# Patient Record
Sex: Female | Born: 1990 | Race: White | Hispanic: No | Smoking: Current every day smoker
Health system: Southern US, Community
[De-identification: ages and names within clinical notes are randomized; demographics above are authoritative.]

## PROBLEM LIST (undated history)

## (undated) DIAGNOSIS — F909 Attention-deficit hyperactivity disorder, unspecified type: Secondary | ICD-10-CM

## (undated) DIAGNOSIS — F329 Major depressive disorder, single episode, unspecified: Secondary | ICD-10-CM

## (undated) DIAGNOSIS — R140 Abdominal distension (gaseous): Secondary | ICD-10-CM

## (undated) DIAGNOSIS — R350 Frequency of micturition: Secondary | ICD-10-CM

## (undated) DIAGNOSIS — R109 Unspecified abdominal pain: Secondary | ICD-10-CM

## (undated) DIAGNOSIS — R7303 Prediabetes: Secondary | ICD-10-CM

## (undated) DIAGNOSIS — F419 Anxiety disorder, unspecified: Secondary | ICD-10-CM

## (undated) DIAGNOSIS — I10 Essential (primary) hypertension: Secondary | ICD-10-CM

## (undated) DIAGNOSIS — B009 Herpesviral infection, unspecified: Secondary | ICD-10-CM

## (undated) DIAGNOSIS — F250 Schizoaffective disorder, bipolar type: Secondary | ICD-10-CM

## (undated) DIAGNOSIS — E119 Type 2 diabetes mellitus without complications: Secondary | ICD-10-CM

## (undated) DIAGNOSIS — M549 Dorsalgia, unspecified: Secondary | ICD-10-CM

## (undated) DIAGNOSIS — K219 Gastro-esophageal reflux disease without esophagitis: Secondary | ICD-10-CM

## (undated) DIAGNOSIS — R319 Hematuria, unspecified: Secondary | ICD-10-CM

## (undated) DIAGNOSIS — F259 Schizoaffective disorder, unspecified: Secondary | ICD-10-CM

## (undated) DIAGNOSIS — M1990 Unspecified osteoarthritis, unspecified site: Secondary | ICD-10-CM

## (undated) DIAGNOSIS — F32A Depression, unspecified: Secondary | ICD-10-CM

## (undated) HISTORY — DX: Dorsalgia, unspecified: M54.9

## (undated) HISTORY — DX: Type 2 diabetes mellitus without complications: E11.9

## (undated) HISTORY — DX: Frequency of micturition: R35.0

## (undated) HISTORY — DX: Essential (primary) hypertension: I10

## (undated) HISTORY — DX: Attention-deficit hyperactivity disorder, unspecified type: F90.9

## (undated) HISTORY — PX: DILATION AND CURETTAGE OF UTERUS: SHX78

## (undated) HISTORY — DX: Prediabetes: R73.03

## (undated) HISTORY — DX: Gastro-esophageal reflux disease without esophagitis: K21.9

## (undated) HISTORY — DX: Hematuria, unspecified: R31.9

## (undated) HISTORY — DX: Herpesviral infection, unspecified: B00.9

## (undated) HISTORY — DX: Abdominal distension (gaseous): R14.0

## (undated) HISTORY — PX: ESSURE TUBAL LIGATION: SUR464

## (undated) HISTORY — DX: Unspecified abdominal pain: R10.9

---

## 2014-08-13 ENCOUNTER — Emergency Department (HOSPITAL_COMMUNITY)
Admission: EM | Admit: 2014-08-13 | Discharge: 2014-08-13 | Disposition: A | Discharge status: Home / Self Care | Payer: Medicaid - Out of State | Attending: Emergency Medicine | Admitting: Emergency Medicine

## 2014-08-13 ENCOUNTER — Encounter (HOSPITAL_COMMUNITY): Payer: Self-pay | Admitting: Emergency Medicine

## 2014-08-13 DIAGNOSIS — Z3202 Encounter for pregnancy test, result negative: Secondary | ICD-10-CM | POA: Diagnosis not present

## 2014-08-13 DIAGNOSIS — Z8659 Personal history of other mental and behavioral disorders: Secondary | ICD-10-CM | POA: Insufficient documentation

## 2014-08-13 DIAGNOSIS — G43809 Other migraine, not intractable, without status migrainosus: Secondary | ICD-10-CM | POA: Diagnosis not present

## 2014-08-13 DIAGNOSIS — Z720 Tobacco use: Secondary | ICD-10-CM | POA: Insufficient documentation

## 2014-08-13 DIAGNOSIS — R51 Headache: Secondary | ICD-10-CM | POA: Diagnosis present

## 2014-08-13 HISTORY — DX: Schizoaffective disorder, unspecified: F25.9

## 2014-08-13 HISTORY — DX: Schizoaffective disorder, bipolar type: F25.0

## 2014-08-13 HISTORY — DX: Anxiety disorder, unspecified: F41.9

## 2014-08-13 HISTORY — DX: Major depressive disorder, single episode, unspecified: F32.9

## 2014-08-13 HISTORY — DX: Depression, unspecified: F32.A

## 2014-08-13 LAB — URINALYSIS, ROUTINE W REFLEX MICROSCOPIC
Bilirubin Urine: NEGATIVE
Glucose, UA: NEGATIVE mg/dL
Ketones, ur: NEGATIVE mg/dL
Nitrite: NEGATIVE
PROTEIN: NEGATIVE mg/dL
Specific Gravity, Urine: 1.01 (ref 1.005–1.030)
Urobilinogen, UA: 0.2 mg/dL (ref 0.0–1.0)
pH: 6 (ref 5.0–8.0)

## 2014-08-13 LAB — URINE MICROSCOPIC-ADD ON

## 2014-08-13 LAB — POC URINE PREG, ED: Preg Test, Ur: NEGATIVE

## 2014-08-13 LAB — CBG MONITORING, ED: Glucose-Capillary: 105 mg/dL — ABNORMAL HIGH (ref 70–99)

## 2014-08-13 NOTE — ED Notes (Addendum)
PT states she thinks she is having periods of hypoglycemia x3 days and a headache x3 days. PT [facility] and dx with vertigo and was triaged and [facility]. Pt also reports increased urinary frequency and fatigue.

## 2014-08-13 NOTE — ED Provider Notes (Signed)
CSN: [id number]     Arrival date & time 08/13/14  1542 History   First MD Initiated Contact with Patient 08/13/14 1800     Chief Complaint  Patient presents with  . Headache      HPI Patient reports intermittent episodes of feeling off-balance followed shortly by a headache.  She denies weakness of her arms or legs.  No prior history of stroke.  She has a history of neuropathy, anxiety, depression.  She denies chest pain or shortness of breath.  No difficulty walking.  She states the symptoms seem to come and go.  It usually begins with a feeling of off balance followed shortly by headache.  She does have a history of migraine headaches as well.  She smokes cigarettes but has no family history of early stroke.  Currently she is asymptomatic in the emergency department.   Past Medical History  Diagnosis Date  . Neuropathy     idopathic  . Anxiety   . Depression   . Schizo-affective schizophrenia    Past Surgical History  Procedure Laterality Date  . Dilation and curettage of uterus     No family history on file. History  Substance Use Topics  . Smoking status: Current Every Day Smoker -- 1.00 packs/day    Types: Cigarettes  . Smokeless tobacco: Not on file  . Alcohol Use: Yes     Comment: occasionally    OB History    Gravida Para Term Preterm AB TAB SAB Ectopic Multiple Living            1     Review of Systems  All other systems reviewed and are negative.     Allergies  Review of patient's allergies indicates no known allergies.  Home Medications   Prior to Admission medications   Not on File   BP 140/75 mmHg  Pulse 96  Temp(Src) 98 F (36.7 C) (Oral)  Resp 16  Ht 5\' 7"  (1.702 m)  Wt 270 lb (122.471 kg)  BMI 42.28 kg/m2  SpO2 97%  LMP 08/05/2014 Physical Exam  Constitutional: She is oriented to person, place, and time. She appears well-developed and well-nourished. No distress.  HENT:  Head: Normocephalic and atraumatic.  Eyes: EOM are normal.  Pupils are equal, round, and reactive to light.  Neck: Normal range of motion.  Cardiovascular: Normal rate, regular rhythm and normal heart sounds.   Pulmonary/Chest: Effort normal and breath sounds normal.  Abdominal: Soft. She exhibits no distension. There is no tenderness.  Musculoskeletal: Normal range of motion.  Neurological: She is alert and oriented to person, place, and time.  5/5 strength in major muscle groups of  bilateral upper and lower extremities. Speech normal. No facial asymetry.   Skin: Skin is warm and dry.  Psychiatric: She has a normal mood and affect. Judgment normal.  Nursing note and vitals reviewed.   ED Course  Procedures (including critical care time) Labs Review Labs Reviewed  CBG MONITORING, ED - Abnormal; Notable for the following:    Glucose-Capillary 105 (*)    All other components within normal limits  CBG MONITORING, ED  POC URINE PREG, ED    Imaging Review No results found.   EKG Interpretation None      MDM   Final diagnoses:  Migraine variant    Suspect vertigo versus basilar migraine.  Likely migraine variant.  Patient has a neurologist I'm asked that she call her neurologist on Monday for follow-up.  Normal neurologic exam at this  time.  No indication for CT imaging of her head.  If this persists she may need an MRI as an outpatient.  This does not need to be done on emergent basis today.  Doubt stroke.    Lyanne CoKevin M Jeanae Whitmill, MD 08/13/14 [phone]

## 2014-08-31 ENCOUNTER — Emergency Department (HOSPITAL_COMMUNITY)
Admission: EM | Admit: 2014-08-31 | Discharge: 2014-09-01 | Disposition: A | Discharge status: Home / Self Care | Payer: Medicaid - Out of State | Attending: Emergency Medicine | Admitting: Emergency Medicine

## 2014-08-31 ENCOUNTER — Encounter (HOSPITAL_COMMUNITY): Payer: Self-pay | Admitting: Emergency Medicine

## 2014-08-31 DIAGNOSIS — Z720 Tobacco use: Secondary | ICD-10-CM | POA: Insufficient documentation

## 2014-08-31 DIAGNOSIS — Z3202 Encounter for pregnancy test, result negative: Secondary | ICD-10-CM | POA: Diagnosis not present

## 2014-08-31 DIAGNOSIS — G629 Polyneuropathy, unspecified: Secondary | ICD-10-CM | POA: Insufficient documentation

## 2014-08-31 DIAGNOSIS — R51 Headache: Secondary | ICD-10-CM | POA: Diagnosis not present

## 2014-08-31 DIAGNOSIS — R42 Dizziness and giddiness: Secondary | ICD-10-CM | POA: Diagnosis not present

## 2014-08-31 DIAGNOSIS — Z8659 Personal history of other mental and behavioral disorders: Secondary | ICD-10-CM | POA: Insufficient documentation

## 2014-08-31 DIAGNOSIS — B349 Viral infection, unspecified: Secondary | ICD-10-CM | POA: Diagnosis not present

## 2014-08-31 LAB — CBC WITH DIFFERENTIAL/PLATELET
BASOS PCT: 0 % (ref 0–1)
Basophils Absolute: 0 10*3/uL (ref 0.0–0.1)
Eosinophils Absolute: 0.1 10*3/uL (ref 0.0–0.7)
Eosinophils Relative: 1 % (ref 0–5)
HEMATOCRIT: 41.7 % (ref 36.0–46.0)
HEMOGLOBIN: 14.1 g/dL (ref 12.0–15.0)
LYMPHS PCT: 16 % (ref 12–46)
Lymphs Abs: 2.4 10*3/uL (ref 0.7–4.0)
MCH: 30.5 pg (ref 26.0–34.0)
MCHC: 33.8 g/dL (ref 30.0–36.0)
MCV: 90.3 fL (ref 78.0–100.0)
MONO ABS: 0.5 10*3/uL (ref 0.1–1.0)
MONOS PCT: 3 % (ref 3–12)
NEUTROS ABS: 12.4 10*3/uL — AB (ref 1.7–7.7)
Neutrophils Relative %: 81 % — ABNORMAL HIGH (ref 43–77)
Platelets: 238 10*3/uL (ref 150–400)
RBC: 4.62 MIL/uL (ref 3.87–5.11)
RDW: 12.9 % (ref 11.5–15.5)
WBC: 15.4 10*3/uL — ABNORMAL HIGH (ref 4.0–10.5)

## 2014-08-31 LAB — BASIC METABOLIC PANEL
Anion gap: 4 — ABNORMAL LOW (ref 5–15)
BUN: 8 mg/dL (ref 6–23)
CO2: 29 mmol/L (ref 19–32)
CREATININE: 0.73 mg/dL (ref 0.50–1.10)
Calcium: 8.8 mg/dL (ref 8.4–10.5)
Chloride: 107 mEq/L (ref 96–112)
GFR calc Af Amer: >90 mL/min (ref >90)
GFR calc non Af Amer: >90 mL/min (ref >90)
Glucose, Bld: 114 mg/dL — ABNORMAL HIGH (ref 70–99)
Potassium: 3.6 mmol/L (ref 3.5–5.1)
Sodium: 140 mmol/L (ref 135–145)

## 2014-08-31 LAB — URINALYSIS, ROUTINE W REFLEX MICROSCOPIC
Bilirubin Urine: NEGATIVE
GLUCOSE, UA: NEGATIVE mg/dL
Ketones, ur: NEGATIVE mg/dL
Leukocytes, UA: NEGATIVE
Nitrite: NEGATIVE
Protein, ur: NEGATIVE mg/dL
Specific Gravity, Urine: 1.025 (ref 1.005–1.030)
Urobilinogen, UA: 0.2 mg/dL (ref 0.0–1.0)
pH: 6 (ref 5.0–8.0)

## 2014-08-31 LAB — POC URINE PREG, ED: PREG TEST UR: NEGATIVE

## 2014-08-31 LAB — URINE MICROSCOPIC-ADD ON

## 2014-08-31 NOTE — ED Notes (Signed)
Patient reports dizziness, generalized weakness, nausea, and fatigue x 2 weeks. States recently evaluated here for same.

## 2014-08-31 NOTE — ED Notes (Signed)
POC Urine pregnancy negative. 

## 2014-08-31 NOTE — ED Provider Notes (Signed)
CSN: [id number]     Arrival date & time 08/31/14  2128 History   First MD Initiated Contact with Patient 08/31/14 2213     Chief Complaint  Patient presents with  . Dizziness     (Consider location/radiation/quality/duration/timing/severity/associated sxs/prior Treatment) Patient is a 23 y.o. female presenting with dizziness. The history is provided by the patient.  Dizziness Quality:  Lightheadedness Duration:  2 weeks Associated symptoms: headaches   Associated symptoms: no blood in stool, no chest pain, no palpitations and no shortness of breath     Past Medical History  Diagnosis Date  . Neuropathy     idopathic  . Anxiety   . Depression   . Schizo-affective schizophrenia    Past Surgical History  Procedure Laterality Date  . Dilation and curettage of uterus     History reviewed. No pertinent family history. History  Substance Use Topics  . Smoking status: Current Every Day Smoker -- 1.00 packs/day    Types: Cigarettes  . Smokeless tobacco: Not on file  . Alcohol Use: Yes     Comment: occasionally    OB History    Gravida Para Term Preterm AB TAB SAB Ectopic Multiple Living            1     Review of Systems  Constitutional: Negative for activity change.       All ROS Neg except as noted in HPI  HENT: Negative for nosebleeds.   Eyes: Negative for photophobia and discharge.  Respiratory: Negative for cough, shortness of breath and wheezing.   Cardiovascular: Negative for chest pain and palpitations.  Gastrointestinal: Negative for abdominal pain and blood in stool.  Genitourinary: Negative for dysuria, frequency and hematuria.  Musculoskeletal: Negative for back pain, arthralgias and neck pain.  Skin: Negative.   Neurological: Positive for dizziness and headaches. Negative for seizures and speech difficulty.  Psychiatric/Behavioral: Negative for hallucinations and confusion. The patient is nervous/anxious.        Depression      Allergies  Review of  patient's allergies indicates no known allergies.  Home Medications   Prior to Admission medications   Not on File   BP 129/75 mmHg  Pulse 75  Temp(Src) 98 F (36.7 C) (Oral)  Resp 20  Ht 5\' 8"  (1.727 m)  Wt 270 lb (122.471 kg)  BMI 41.06 kg/m2  SpO2 100%  LMP 08/05/2014 Physical Exam  Constitutional: She is oriented to person, place, and time. She appears well-developed and well-nourished.  Non-toxic appearance.  HENT:  Head: Normocephalic.  Right Ear: Tympanic membrane and external ear normal.  Left Ear: Tympanic membrane and external ear normal.  Eyes: EOM and lids are normal. Pupils are equal, round, and reactive to light.  No nystagmus.  Neck: Normal range of motion. Neck supple. Carotid bruit is not present.  Cardiovascular: Normal rate, regular rhythm, normal heart sounds, intact distal pulses and normal pulses.   Pulmonary/Chest: Breath sounds normal. No respiratory distress. She has no wheezes. She has no rales.  Abdominal: Soft. Bowel sounds are normal. There is no tenderness. There is no guarding.  Musculoskeletal: Normal range of motion. She exhibits no edema or tenderness.  Lymphadenopathy:       Head (right side): No submandibular adenopathy present.       Head (left side): No submandibular adenopathy present.    She has no cervical adenopathy.  Neurological: She is alert and oriented to person, place, and time. She has normal strength. No cranial nerve deficit or  sensory deficit. She exhibits normal muscle tone. Coordination normal.  Gait steady.  Skin: Skin is warm and dry.  Psychiatric: She has a normal mood and affect. Her speech is normal.  Nursing note and vitals reviewed.   ED Course  Pt seen with me by Dr Bebe ShaggyWickline.  Procedures (including critical care time) Labs Review Labs Reviewed - No data to display  Imaging Review No results found.   EKG Interpretation None      MDM  Vital signs are within normal limits. Pulse ox 100% on RA, WNL by   My interpretation.  Chest xray and EKG are non-acute. UA is non-acute. Urine preg test is neg. CBC reveals elevation in WBC of 15.4, with no shift to the left.  Pt is ambulatory without problem.  Suspect viral illness. Pt on strong sedating medications. Advised pt that this could have an effect as well. She will follow up with primary MD. She will increase fluids and change positions slowly.   Final diagnoses:  None    **I have reviewed nursing notes, vital signs, and all appropriate lab and imaging results for this patient.Kathie Dike*    Heidi Kreischer M Brinly Maietta, PA-C 09/01/14 0139  Joya Gaskinsonald W Wickline, MD 09/01/14 [phone]

## 2014-09-01 ENCOUNTER — Emergency Department (HOSPITAL_COMMUNITY): Payer: Medicaid - Out of State

## 2014-09-01 ENCOUNTER — Other Ambulatory Visit: Payer: Self-pay

## 2014-09-01 NOTE — Discharge Instructions (Signed)
Your EKG, xrays, and urine are negative for acute problem. Please increase fluids. Use tylenol or ibuprofen for fever or aching. Change positions slowly. Please list your symptoms and discuss with your primary MD. Return to  The ED if any emergent changes or complications. Viral Infections A viral infection can be caused by different types of viruses.Most viral infections are not serious and resolve on their own. However, some infections may cause severe symptoms and may lead to further complications. SYMPTOMS Viruses can frequently cause:  Minor sore throat.  Aches and pains.  Headaches.  Runny nose.  Different types of rashes.  Watery eyes.  Tiredness.  Cough.  Loss of appetite.  Gastrointestinal infections, resulting in nausea, vomiting, and diarrhea. These symptoms do not respond to antibiotics because the infection is not caused by bacteria. However, you might catch a bacterial infection following the viral infection. This is sometimes called a "superinfection." Symptoms of such a bacterial infection may include:  Worsening sore throat with pus and difficulty swallowing.  Swollen neck glands.  Chills and a high or persistent fever.  Severe headache.  Tenderness over the sinuses.  Persistent overall ill feeling (malaise), muscle aches, and tiredness (fatigue).  Persistent cough.  Yellow, green, or brown mucus production with coughing. HOME CARE INSTRUCTIONS   Only take over-the-counter or prescription medicines for pain, discomfort, diarrhea, or fever as directed by your caregiver.  Drink enough water and fluids to keep your urine clear or pale yellow. Sports drinks can provide valuable electrolytes, sugars, and hydration.  Get plenty of rest and maintain proper nutrition. Soups and broths with crackers or rice are fine. SEEK IMMEDIATE MEDICAL CARE IF:   You have severe headaches, shortness of breath, chest pain, neck pain, or an unusual rash.  You have  uncontrolled vomiting, diarrhea, or you are unable to keep down fluids.  You or your child has an oral temperature above 102 F (38.9 C), not controlled by medicine.  Your baby is older than 3 months with a rectal temperature of 102 F (38.9 C) or higher.  Your baby is 513 months old or younger with a rectal temperature of 100.4 F (38 C) or higher. MAKE SURE YOU:   Understand these instructions.  Will watch your condition.  Will get help right away if you are not doing well or get worse. Document Released: 05/29/2005 Document Revised: 11/11/2011 Document Reviewed: 12/24/2010 [facility] Patient Information 2015 [employer] [place]. This information is not intended to replace advice given to you by your health care provider. Make sure you discuss any questions you have with your health care provider.

## 2014-09-01 NOTE — ED Notes (Signed)
Family at bedside. EKG done and given to Dr.Bryant.

## 2014-09-01 NOTE — ED Provider Notes (Signed)
Patient [facility] in [facility] Emergency Department [facility] Patient reports dizziness Exam : awake/alert, no distress, resting comfortably Plan: workup in the ED unremarkable. Pt is appropriate for d/c home    Joya Gaskinsonald W Mindi Akerson, MD 09/01/14 [phone]

## 2014-09-28 ENCOUNTER — Telehealth: Payer: Self-pay | Admitting: Family Medicine

## 2014-09-28 NOTE — Telephone Encounter (Signed)
Stp and she states she doesn't have insurance yet and can't afford $72 for a visit but will CB when she gets her insurance.

## 2015-01-20 ENCOUNTER — Emergency Department (HOSPITAL_COMMUNITY)
Admission: EM | Admit: 2015-01-20 | Discharge: 2015-01-21 | Disposition: A | Discharge status: Home / Self Care | Payer: Medicaid - Out of State | Attending: Emergency Medicine | Admitting: Emergency Medicine

## 2015-01-20 ENCOUNTER — Encounter (HOSPITAL_COMMUNITY): Payer: Self-pay

## 2015-01-20 DIAGNOSIS — R102 Pelvic and perineal pain: Secondary | ICD-10-CM | POA: Insufficient documentation

## 2015-01-20 DIAGNOSIS — Z79899 Other long term (current) drug therapy: Secondary | ICD-10-CM | POA: Insufficient documentation

## 2015-01-20 DIAGNOSIS — F209 Schizophrenia, unspecified: Secondary | ICD-10-CM | POA: Insufficient documentation

## 2015-01-20 DIAGNOSIS — Z3202 Encounter for pregnancy test, result negative: Secondary | ICD-10-CM | POA: Insufficient documentation

## 2015-01-20 DIAGNOSIS — G8929 Other chronic pain: Secondary | ICD-10-CM | POA: Insufficient documentation

## 2015-01-20 DIAGNOSIS — F419 Anxiety disorder, unspecified: Secondary | ICD-10-CM | POA: Insufficient documentation

## 2015-01-20 DIAGNOSIS — F329 Major depressive disorder, single episode, unspecified: Secondary | ICD-10-CM | POA: Insufficient documentation

## 2015-01-20 DIAGNOSIS — Z720 Tobacco use: Secondary | ICD-10-CM | POA: Insufficient documentation

## 2015-01-20 DIAGNOSIS — G629 Polyneuropathy, unspecified: Secondary | ICD-10-CM | POA: Insufficient documentation

## 2015-01-20 MED ORDER — KETOROLAC TROMETHAMINE 30 MG/ML IJ SOLN
60.0000 mg | Freq: Once | INTRAMUSCULAR | Status: AC
  Administered 2015-01-21: 60 mg via INTRAMUSCULAR
  Filled 2015-01-20: qty 2

## 2015-01-20 NOTE — ED Provider Notes (Signed)
TIME SEEN: 11:46 PM  CHIEF COMPLAINT: Multiple complaints  HPI:  Heidi [name] is a 24 y.o. female with history of schizoaffective disorder, peripheral neuropathy, lumbar radiculopathy, obesity who presents to the Emergency Department complaining of chronic iffuse pain.  She states that she had an Essure procedure done in February 2014 at [facility]. States she had a confirmation study to confirm placement of the coils in her fallopian tubes 3 months later.  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 She states that she began to experience pain in the lower extremities and then was diagnosed with idiopathic neuropathy, lumbar radiculopathy by Dr. Valorie RooseveltKissell [facility]. She reports that over the last few months her back pain and leg pain has increased and she is now having pain in both of her shoulders and both of her knees.  No history of injury to her extremities. No history of lupus, rheumatoid arthritis.  No fever, rash, joint effusion, calf tenderness or swelling.She states that she came into the ED today because although she has had pain over time, she cannot afford to see a doctor and has no insurance. She states "I want an xray to make sure nothing is dislodged since my surgery" she states she has been reading side effects on the Internet and is concerned that this may be causing her diffuse all over body pain. States she has followed up with her OB/GYN who has attempted to reassure her but patient is not comforted by this.   She states her last menstrual period was one month ago; states that she took a home pregnancy test recently and it was negative. She reports associated pelvic abdominal pain since the Essure procedure in 2014; she states that her pain flares up when she has anxiety attacks. She denies fever, vomiting,  vaginal bleeding or discharge, or diarrhea. She reports history of trichomonas and chlamydia and states that she currently has herpes. She reports 2 pregnancies, one miscarried and one carried to full term.   ROS: See HPI Constitutional: no fever  Eyes: no drainage  ENT: no runny nose   Cardiovascular:  no chest pain  Resp: no SOB  GI: no vomiting GU: no dysuria Integumentary: no rash  Allergy: no hives  Musculoskeletal: no leg swelling  Neurological: no slurred speech ROS otherwise negative  PAST MEDICAL HISTORY/PAST SURGICAL HISTORY:  Past Medical History  Diagnosis Date  . Neuropathy     idopathic  . Anxiety   . Depression   . Schizo-affective schizophrenia     MEDICATIONS:  Prior to Admission medications   Medication Sig Start Date End Date Taking? Authorizing Provider  ALPRAZolam Prudy Feeler(XANAX) 1 MG tablet Take 1 mg by mouth 4 (four) times daily.    Historical Provider, MD  ARIPiprazole (ABILIFY) 2 MG tablet Take 2 mg by mouth at bedtime.     Historical Provider, MD  cyclobenzaprine (FLEXERIL) 10 MG tablet Take 10 mg by mouth 3 (three) times daily as needed for muscle spasms.    Historical Provider, MD  gabapentin (NEURONTIN) 300 MG capsule Take 300 mg by mouth 3 (three) times daily.    Historical Provider, MD  HYDROcodone-acetaminophen (NORCO/VICODIN) 5-325 MG per tablet Take 1 tablet by mouth 3 (three) times daily as needed (pain).    Historical Provider, MD  sertraline (ZOLOFT)  100 MG tablet Take 100 mg by mouth daily.    Historical Provider, MD  traZODone (DESYREL) 50 MG tablet Take 50 mg by mouth at bedtime as needed for sleep.    Historical Provider, MD    ALLERGIES:  Allergies  Allergen Reactions  . Morphine And Related Other (See Comments)    Makes pt.aggressive    SOCIAL HISTORY:  History  Substance Use Topics  . Smoking status: Current Every Day Smoker -- 1.00 packs/day    Types: Cigarettes  . Smokeless tobacco: Not on file  . Alcohol Use: Yes     Comment:  occasionally     FAMILY HISTORY: No family history on file.  EXAM: BP 131/67 mmHg  Pulse 78  Temp(Src) 98.9 F (37.2 C) (Oral)  Resp 20  Ht  (1.702 m)  Wt 276 lb (125.193 kg)  BMI 43.22 kg/m2  SpO2 100%  LMP 12/21/2014   CONSTITUTIONAL: Alert and oriented and responds appropriately to questions; obese, non-toxic, tearful HEAD: Normocephalic EYES: Conjunctivae clear, PERRL ENT: normal nose; no rhinorrhea; moist mucous membranes; pharynx without lesions noted NECK: Supple, no meningismus, no LAD  CARD: RRR; S1 and S2 appreciated; no murmurs, no clicks, no rubs, no gallops RESP: Normal chest excursion without splinting or tachypnea; breath sounds clear and equal bilaterally; no wheezes, no rhonchi, no rales, no hypoxia or respiratory distress, speaking full sentences, diffuse chest wall is tender to palpation and patient grimaces when I suggested a stethoscope on her chest wall lightly ABD/GI: Normal bowel sounds; non-distended; soft,  mild tenderness throughout her lower abdomeno rebound, no guarding, no peritoneal signs GU:  Normal external genitalia, no vaginal discharge or bleeding, no CMT or adnexal tenderness or fullness BACK:  The back appears normal and is tender to light palpation diffusely without midline spinal step-off or deformity, there is no CVA tenderness EXT: Normal ROM in all joints; no edema; normal capillary refill; no cyanosis, no calf tenderness or swelling; no joint effusions;  no erythema or warmth; all compartments are soft; tender to palpation with soft touch anywhere you touch on her body SKIN: Normal color for age and race; warm, No rash, no lesions noted, no erythema or warmth anywhere noted on her body, no signs of cellulitis or shingles NEURO: Movesall extremities equally, sensation to light touch intact diffusely, cranial nerves II through XII intact, normal gait PSYCH:  patient appears very anxious, tearful   MEDICAL DECISION MAKING: Pt here with  complains of all over pain since 2014. No acute injury.  No signs of  infection. No h/o autoimmune disease.  Neurologically intact. Hemodynamically stable.  I have attempted to reassure patient and explain that her chronic conditions will need to be managed by her OB/GYN, neurologist and PCP.  She is requesting an xray today to confirm her Essure placement that was place in 2014.  Explained to patient that she will likely need HSUG to confirm placement which would be done by her OB/GYN if they felt it was clinically indicated.  Explained that an xray would only show coils in the abdominal cavity but would not give [place] information on where they are in the abdomen.  Explained to patient that I do not feel this imaging needs to done emergently, if at all.  I feel many of her symptoms may be secondary to arthritis, obesity.  Given she has had STDs in the past and is c/o pelvic pain intermittently, will perform pelvic exam with cultures and UA, UPT.  Will give Toradol  for pain.  ED PROGRESS: Pt reports feeling better after Toradol.  Pt's urine shows microscopic hematuria which appears chronic for patient but no other sign of infection.  UPT negative.  Wet prep shows wbc's but no other sign of infection. Pelvic exam was unremarkable and I doubt TOA, PID. Gonorrhea and chlamydia cultures are pending. Have again recommended outpatient follow-up and will discharge patient home with prescription for Mobic given Toradol helped her pain significantly. Discussed return precautions. She verbalizes understanding and is comfortable with plan.     I personally performed the services described in this documentation, which was scribed in my presence. The recorded information has been reviewed and is accurate.   Layla MawKristen N Lucynda Rosano, DO 01/21/15 0102

## 2015-01-20 NOTE — ED Notes (Signed)
Pt states she had the E-sure procedure in 2014 and feels she is having significant side effects from the procedure from body pain and other multiple complaints.

## 2015-01-21 LAB — URINALYSIS, ROUTINE W REFLEX MICROSCOPIC
BILIRUBIN URINE: NEGATIVE
GLUCOSE, UA: NEGATIVE mg/dL
KETONES UR: NEGATIVE mg/dL
Leukocytes, UA: NEGATIVE
Nitrite: NEGATIVE
PH: 6 (ref 5.0–8.0)
Protein, ur: NEGATIVE mg/dL
Specific Gravity, Urine: >1.03 — ABNORMAL HIGH (ref 1.005–1.030)
Urobilinogen, UA: 0.2 mg/dL (ref 0.0–1.0)

## 2015-01-21 LAB — URINE MICROSCOPIC-ADD ON

## 2015-01-21 LAB — WET PREP, GENITAL
Clue Cells Wet Prep HPF POC: NONE SEEN
Trich, Wet Prep: NONE SEEN
Yeast Wet Prep HPF POC: NONE SEEN

## 2015-01-21 LAB — POC URINE PREG, ED: PREG TEST UR: NEGATIVE

## 2015-01-21 MED ORDER — MELOXICAM 15 MG PO TABS: 15.0000 mg | ORAL_TABLET | Freq: Every day | ORAL | Status: DC

## 2015-01-21 NOTE — Discharge Instructions (Signed)
Chronic Pain Chronic pain can be defined as pain that is off and on and lasts for 3-6 months or longer. Many things cause chronic pain, which can make it difficult to make a diagnosis. There are many treatment options available for chronic pain. However, finding a treatment that works well for you may require trying various approaches until the right one is found. Many people benefit from a combination of two or more types of treatment to control their pain. SYMPTOMS  Chronic pain can occur anywhere in the body and can range from mild to very severe. Some types of chronic pain include:  Headache.  Low back pain.  Cancer pain.  Arthritis pain.  Neurogenic pain. This is pain resulting from damage to nerves. People with chronic pain may also have other symptoms such as:  Depression.  Anger.  Insomnia.  Anxiety. DIAGNOSIS  Your health care provider will help diagnose your condition over time. In many cases, the initial focus will be on excluding possible conditions that could be causing the pain. Depending on your symptoms, your health care provider may order tests to diagnose your condition. Some of these tests may include:   Blood tests.   CT scan.   MRI.   X-rays.   Ultrasounds.   Nerve conduction studies.  You may need to see a specialist.  TREATMENT  Finding treatment that works well may take time. You may be referred to a pain specialist. He or she may prescribe medicine or therapies, such as:   Mindful meditation or yoga.  Shots (injections) of numbing or pain-relieving medicines into the spine or area of pain.  Local electrical stimulation.  Acupuncture.   Massage therapy.   Aroma, color, light, or sound therapy.   Biofeedback.   Working with a physical therapist to keep from getting stiff.   Regular, gentle exercise.   Cognitive or behavioral therapy.   Group support.  Sometimes, surgery may be recommended.  HOME CARE INSTRUCTIONS    Take all medicines as directed by your health care provider.   Lessen stress in your life by relaxing and doing things such as listening to calming music.   Exercise or be active as directed by your health care provider.   Eat a healthy diet and include things such as vegetables, fruits, fish, and lean meats in your diet.   Keep all follow-up appointments with your health care provider.   Attend a support group with others suffering from chronic pain. SEEK MEDICAL CARE IF:   Your pain gets worse.   You develop a new pain that was not there before.   You cannot tolerate medicines given to you by your health care provider.   You have new symptoms since your last visit with your health care provider.  SEEK IMMEDIATE MEDICAL CARE IF:   You feel weak.   You have decreased sensation or numbness.   You lose control of bowel or bladder function.   Your pain suddenly gets much worse.   You develop shaking.  You develop chills.  You develop confusion.  You develop chest pain.  You develop shortness of breath.  MAKE SURE YOU:  Understand these instructions.  Will watch your condition.  Will get help right away if you are not doing well or get worse. Document Released: 05/11/2002 Document Revised: 04/21/2013 Document Reviewed: 02/12/2013 [facility] Patient Information 2015 [place], [place]. This information is not intended to replace advice given to you by your health care provider. Make sure you discuss any  questions you have with your health care provider.  Pelvic Pain Female pelvic pain can be caused by many different things and start from a variety of places. Pelvic pain refers to pain that is located in the lower half of the abdomen and between your hips. The pain may occur over a short period of time (acute) or may be reoccurring (chronic). The cause of pelvic pain may be related to disorders affecting the female reproductive organs (gynecologic), but it may  also be related to the bladder, kidney stones, an intestinal complication, or muscle or skeletal problems. Getting help right away for pelvic pain is important, especially if there has been severe, sharp, or a sudden onset of unusual pain. It is also important to get help right away because some types of pelvic pain can be life threatening.  CAUSES  Below are only some of the causes of pelvic pain. The causes of pelvic pain can be in one of several categories.   Gynecologic.  Pelvic inflammatory disease.  Sexually transmitted infection.  Ovarian cyst or a twisted ovarian ligament (ovarian torsion).  Uterine lining that grows outside the uterus (endometriosis).  Fibroids, cysts, or tumors.  Ovulation.  Pregnancy.  Pregnancy that occurs outside the uterus (ectopic pregnancy).  Miscarriage.  Labor.  Abruption of the placenta or ruptured uterus.  Infection.  Uterine infection (endometritis).  Bladder infection.  Diverticulitis.  Miscarriage related to a uterine infection (septic abortion).  Bladder.  Inflammation of the bladder (cystitis).  Kidney stone(s).  Gastrointestinal.  Constipation.  Diverticulitis.  Neurologic.  Trauma.  Feeling pelvic pain because of mental or emotional causes (psychosomatic).  Cancers of the bowel or pelvis. EVALUATION  Your caregiver will want to take a careful history of your concerns. This includes recent changes in your health, a careful gynecologic history of your periods (menses), and a sexual history. Obtaining your family history and medical history is also important. Your caregiver may suggest a pelvic exam. A pelvic exam will help identify the location and severity of the pain. It also helps in the evaluation of which organ system may be involved. In order to identify the cause of the pelvic pain and be properly treated, your caregiver may order tests. These tests may include:   A pregnancy test.  Pelvic  ultrasonography.  An X-ray exam of the abdomen.  A urinalysis or evaluation of vaginal discharge.  Blood tests. HOME CARE INSTRUCTIONS   Only take over-the-counter or prescription medicines for pain, discomfort, or fever as directed by your caregiver.   Rest as directed by your caregiver.   Eat a balanced diet.   Drink enough fluids to make your urine clear or pale yellow, or as directed.   Avoid sexual intercourse if it causes pain.   Apply warm or cold compresses to the lower abdomen depending on which one helps the pain.   Avoid stressful situations.   Keep a journal of your pelvic pain. Write down when it started, where the pain is located, and if there are things that seem to be associated with the pain, such as food or your menstrual cycle.  Follow up with your caregiver as directed.  SEEK MEDICAL CARE IF:  Your medicine does not help your pain.  You have abnormal vaginal discharge. SEEK IMMEDIATE MEDICAL CARE IF:   You have heavy bleeding from the vagina.   Your pelvic pain increases.   You feel light-headed or faint.   You have chills.   You have pain with urination or  blood in your urine.   You have uncontrolled diarrhea or vomiting.   You have a fever or persistent symptoms for more than 3 days.  You have a fever and your symptoms suddenly get worse.   You are being physically or sexually abused.  MAKE SURE YOU:  Understand these instructions.  Will watch your condition.  Will get help if you are not doing well or get worse. Document Released: 07/16/2004 Document Revised: 01/03/2014 Document Reviewed: 12/09/2011 [facility] Patient Information 2015 [place], [place]. This information is not intended to replace advice given to you by your health care provider. Make sure you discuss any questions you have with your health care provider.    Emergency Department Resource Guide 1) Find a Doctor and Pay Out of Pocket Although you won't  have to find out who is covered by your insurance plan, it is a good idea to ask around and get recommendations. You will then need to call the office and see if the doctor you have chosen will accept you as a new patient and what types of options they offer for patients who are self-pay. Some doctors offer discounts or will set up payment plans for their patients who do not have insurance, but you will need to ask so you aren't surprised when you get to your appointment.  2) Contact Your Local Health Department Not all health departments have doctors that can see patients for sick visits, but many do, so it is worth a call to see if yours does. If you don't know where your local health department is, you can check in your phone book. The CDC also has a tool to help you locate your state's health department, and many state websites also have listings of all of their local health departments.  3) Find a Walk-in Clinic If your illness is not likely to be very severe or complicated, you may want to try a walk in clinic. These are popping up all over the country in pharmacies, drugstores, and shopping centers. They're usually staffed by nurse practitioners or physician assistants that have been trained to treat common illnesses and complaints. They're usually fairly quick and inexpensive. However, if you have serious medical issues or chronic medical problems, these are probably not your best option.  No Primary Care Doctor: - Call Health Connect at  [phone] - they can help you locate a primary care doctor that  accepts your insurance, provides certain services, etc. - Physician Referral Service- [phone]  Chronic Pain Problems: Organization         Address  Phone   Notes  [facility]  [phone] Patients need to be referred by their primary care doctor.   Medication Assistance: Organization         Address  Phone   Notes  [facility] Medication [facility]  [address] [place] [place], [place] [address] [phone] --Must be a resident of [facility] -- Must have NO insurance coverage whatsoever (no Medicaid/ Medicare, etc.) -- The pt. MUST have a primary care doctor that directs their care regularly and follows them in the community   [facility]ist  [phone]   [employer]  [phone]    Agencies that provide inexpensive medical care: Organization         Address  Phone   Notes  [facility]  [phone]   [facility]    [phone]   [facility] Outpatient  Clinic [address] [place], [place] [address] [phone]   [facility] [address] [place] [place] [phone]   Planned Parenthood    [phone]   [facility]    [phone]   [facility]  [address], [place] Phone:  [phone], Fax:  [phone] Hours of Operation:  9 am - 6 pm, M-F.  Also accepts Medicaid/Medicare and self-pay.  [facility]  [address], [place] Phone: [phone], Fax: [phone]. Hours of Operation:  8:30 am - 5:30 pm, M-F.  Also accepts Medicaid and self-pay.  [facility] [place] [address] [place] Phone: [phone]   [facility] [address] Natasha Bence [place], [place] [phone], Ext. [phone] Mondays & Thursdays: 7-9 AM.  First 15 patients are seen on a first come, first serve basis.    Medicaid-accepting [facility] Providers:  Organization         Address  Phone   Notes  [facility] [address], [place] [phone] Also accepts self-pay patients.  [facility] [address] Laurell Josephs [place], [place]  [phone]   [facility] [address], [place] [phone]   [facility] [address], [place] [phone]    Renaye Rakers [address], [place]   [phone] Only accepts [place] patients after they have their name applied to their card.   Self-Pay (no insurance) in [facility]:  Organization         Address  Phone   Notes  Sickle Cell Patients, [facility] [address] [place], [place] [phone]   [facility] [address] [place], [place] [phone]   [facility] [place]  [address], [place] [phone]   [facility]/Dr. Osei-Bonsu  [address] [place] or [address], [place] [phone] Phone number for both [place] and [place] locations is the same.  [facility] [address] [place] [phone]   [place] [address] [place] or [address] [phone]   [facility] [address], [place] [phone], phone; [phone], fax Sees patients 1st and 3rd Saturday of every month.  Must not qualify for public or private insurance (i.e. Medicaid, Medicare, [place] Health Choice, Veterans' Benefits)  Household income should be no more than 200% of the poverty level The clinic cannot treat you if you are pregnant or think you are pregnant  Sexually transmitted diseases are not treated at the clinic.    Dental Care: Organization         Address  Phone  Notes  [place] [facility] [address] [place], [place] [phone] Accepts children up to age 68 who are enrolled in [place] or [place] Health Choice; pregnant women with a Medicaid card; and children who have applied for Medicaid or [place] Health Choice, but were declined, whose parents can pay a reduced fee at time of service.  [facility]  [address], [place] [phone] Accepts children  up to age 25 who are enrolled in  Medicaid or [place] Health Choice; pregnant women with a Medicaid card; and children who have applied for Medicaid or  Health Choice, but were declined, whose parents can pay a reduced fee at time of service.  [facility]  [address] [place], [place] [phone] Patients are seen by appointment only. Walk-ins are not accepted. [facility] will see patients 25 years of age and older. Monday - Tuesday (8am-5pm) Most Wednesdays (8:30-5pm) $30 per visit, cash only  [facility]  [address], [facility] [phone] Patients are seen by appointment only. Walk-ins are not accepted. [facility] will see patients 10 years of age and older. One Wednesday Evening (Monthly: Volunteer Based).  $30 per visit, cash only  [employer]  [phone] for adults; Children under age 45, call Graduate Pediatric Dentistry at [phone]. Children aged 35-14, please call [phone] to request a pediatric application.  Dental services are provided in all areas of dental care including fillings, crowns and bridges, complete and partial dentures, implants, gum treatment, root canals, and extractions. Preventive care is also provided. Treatment is provided to both adults and children. Patients are selected via a lottery and there is often a waiting list.   [facility] [address] [place]  [phone] [email or web address]   [facility] [address] [place], [place] [phone], Ext. [phone] Second and Fourth Thursday of each month, opens at 6:30 AM; Clinic ends at 9 AM.  Patients are seen on a first-come first-served basis, and a limited number are seen during each clinic.   [facility]  [address] Ether Griffins [place], [place] [phone]   Eligibility Requirements You must have lived in [place], [place], or [place] counties for at least the last  three months.   You cannot be eligible for state or federal sponsored [place], including [employer], [place], or [employer].   You generally cannot be eligible for healthcare insurance through your employer.    How to apply: Eligibility screenings are held every Tuesday and Wednesday afternoon from 1:00 pm until 4:00 pm. You do not need an appointment for the interview!  [facility] [address] [place], [place] [phone]   [facility] Health Department  [phone]   [facility] [place] [facility] Health [facility]  [phone]   [facility]  [phone]    Behavioral Health Resources in the Community: Intensive Outpatient Programs Organization         Address  Phone  Notes  [facility] [address], [place], [place] [phone]   [facility] Outpatient [address], [place], [place] [phone]   [facility] [address] [place], [place]  [phone]   [facility] Mental Health [address]  [place], [place] [phone] or [phone]   Substance Abuse Resources Organization         Address  Phone  Notes  [facility]  [phone]   [facility]  [phone]   [facility]  [phone]   Floydene Flock  [phone]   Residential & Outpatient Substance Abuse Program  [phone]   Psychological Services Organization         Address  Phone  Notes  [facility]  [phone]   [facility]  [phone]   [facility] Mental Health [address] [place] [phone] or [phone]  Mobile Crisis Teams Organization         Address  Phone  Notes  [facility], Mobile Crisis Care Unit  [phone]   [facility]  [address] [place], [place] [phone]   Doristine LocksSharon DeEsch [address] [place] [phone]     Self-Help/Support Groups Organization         Address  Phone             Notes  [facility] [place] - variety of support groups  [phone] Call for more information  Narcotics Anonymous (NA), [facility] [address], [place]  2 meetings at this location   Statisticianesidential Treatment Programs Organization         Address  Phone  Notes  [facility] [address],    [place]  [phone]   [facility]  [address], [place] [phone], [place], [place] [phone]   [facility] [address], [place] [phone] Admissions: 8am-3pm M-F  [facility] [address]    [place], [place] [phone]   [facility] [address], [place], [place] [phone]   [facility] [address]  [place], [place] [phone]   [facility] - Intensive Outpatient [address], [place], [place] [phone]   [facility] ([facility] [address]  [place], [place] [phone] or [phone]   [facility] ([facility]) [address] [place], [place] [phone] Accepts Medicaid  [facility] [address]  [place] [phone] Substance Abuse/Addiction Treatment   [facility] Behavioral Health Resources Organization         Address  Phone  Notes  [facility]  [phone]) [phone]   Angie FavaJulie Brannon, PhD [address] [place], [place]   [phone] or [phone](336) [phone]   [facility]   [address] [place], [place] [phone](336) [phone]   [facility] [address], [place], [place] [phone](336) [phone] Insurance/Medicaid/sponsorship through [facility]  [facility] [address]                                    [place], [place] [phone](336) [phone] Therapy/tele-psych/case  [facility] [address]   [place], [place] [phone]) [phone]    Dr. Lolly MustacheArfeen  [phone]   Free  [facility] [place]  [employer] [facility] 1) [address], [place] 2) [address], [place] 3)  [address], [place] [phone]) [phone]) [phone]  [phone]) [phone]   [facility] Child Abuse Hotline [phone]) [phone] or [phone]) [phone] (After Hours)

## 2015-01-23 LAB — GC/CHLAMYDIA PROBE AMP (~~LOC~~) NOT AT ARMC
CHLAMYDIA, DNA PROBE: NEGATIVE
Neisseria Gonorrhea: NEGATIVE

## 2015-03-22 ENCOUNTER — Encounter (HOSPITAL_COMMUNITY): Payer: Self-pay

## 2015-03-22 DIAGNOSIS — Z791 Long term (current) use of non-steroidal anti-inflammatories (NSAID): Secondary | ICD-10-CM | POA: Insufficient documentation

## 2015-03-22 DIAGNOSIS — F419 Anxiety disorder, unspecified: Secondary | ICD-10-CM | POA: Insufficient documentation

## 2015-03-22 DIAGNOSIS — Z720 Tobacco use: Secondary | ICD-10-CM | POA: Insufficient documentation

## 2015-03-22 DIAGNOSIS — R1012 Left upper quadrant pain: Secondary | ICD-10-CM | POA: Insufficient documentation

## 2015-03-22 DIAGNOSIS — Z3202 Encounter for pregnancy test, result negative: Secondary | ICD-10-CM | POA: Insufficient documentation

## 2015-03-22 DIAGNOSIS — R238 Other skin changes: Secondary | ICD-10-CM | POA: Insufficient documentation

## 2015-03-22 DIAGNOSIS — F259 Schizoaffective disorder, unspecified: Secondary | ICD-10-CM | POA: Insufficient documentation

## 2015-03-22 DIAGNOSIS — G629 Polyneuropathy, unspecified: Secondary | ICD-10-CM | POA: Insufficient documentation

## 2015-03-22 DIAGNOSIS — F329 Major depressive disorder, single episode, unspecified: Secondary | ICD-10-CM | POA: Insufficient documentation

## 2015-03-22 DIAGNOSIS — R110 Nausea: Secondary | ICD-10-CM | POA: Insufficient documentation

## 2015-03-22 DIAGNOSIS — Z79899 Other long term (current) drug therapy: Secondary | ICD-10-CM | POA: Insufficient documentation

## 2015-03-22 NOTE — ED Notes (Signed)
Pt states she popped a bump that is to the right side of her nose, states since then she feels really bad

## 2015-03-23 ENCOUNTER — Emergency Department (HOSPITAL_COMMUNITY)
Admission: EM | Admit: 2015-03-23 | Discharge: 2015-03-23 | Disposition: A | Discharge status: Home / Self Care | Payer: Medicaid - Out of State | Attending: Emergency Medicine | Admitting: Emergency Medicine

## 2015-03-23 DIAGNOSIS — R238 Other skin changes: Secondary | ICD-10-CM

## 2015-03-23 DIAGNOSIS — R1012 Left upper quadrant pain: Secondary | ICD-10-CM

## 2015-03-23 LAB — COMPREHENSIVE METABOLIC PANEL
ALT: 31 U/L (ref 14–54)
ANION GAP: 8 (ref 5–15)
AST: 23 U/L (ref 15–41)
Albumin: 4 g/dL (ref 3.5–5.0)
Alkaline Phosphatase: 117 U/L (ref 38–126)
BUN: 13 mg/dL (ref 6–20)
CO2: 25 mmol/L (ref 22–32)
CREATININE: 0.81 mg/dL (ref 0.44–1.00)
Calcium: 8.8 mg/dL — ABNORMAL LOW (ref 8.9–10.3)
Chloride: 105 mmol/L (ref 101–111)
GFR calc Af Amer: >60 mL/min (ref >60)
GLUCOSE: 125 mg/dL — AB (ref 65–99)
Potassium: 3.4 mmol/L — ABNORMAL LOW (ref 3.5–5.1)
Sodium: 138 mmol/L (ref 135–145)
Total Bilirubin: 0.3 mg/dL (ref 0.3–1.2)
Total Protein: 6.5 g/dL (ref 6.5–8.1)

## 2015-03-23 LAB — CBC WITH DIFFERENTIAL/PLATELET
Basophils Absolute: 0 10*3/uL (ref 0.0–0.1)
Basophils Relative: 0 % (ref 0–1)
EOS PCT: 2 % (ref 0–5)
Eosinophils Absolute: 0.2 10*3/uL (ref 0.0–0.7)
HCT: 38.7 % (ref 36.0–46.0)
Hemoglobin: 13.3 g/dL (ref 12.0–15.0)
LYMPHS ABS: 3.7 10*3/uL (ref 0.7–4.0)
LYMPHS PCT: 29 % (ref 12–46)
MCH: 31.2 pg (ref 26.0–34.0)
MCHC: 34.4 g/dL (ref 30.0–36.0)
MCV: 90.8 fL (ref 78.0–100.0)
Monocytes Absolute: 0.6 10*3/uL (ref 0.1–1.0)
Monocytes Relative: 5 % (ref 3–12)
NEUTROS ABS: 8 10*3/uL — AB (ref 1.7–7.7)
NEUTROS PCT: 64 % (ref 43–77)
PLATELETS: 230 10*3/uL (ref 150–400)
RBC: 4.26 MIL/uL (ref 3.87–5.11)
RDW: 12.7 % (ref 11.5–15.5)
WBC: 12.5 10*3/uL — ABNORMAL HIGH (ref 4.0–10.5)

## 2015-03-23 LAB — URINALYSIS, ROUTINE W REFLEX MICROSCOPIC
Bilirubin Urine: NEGATIVE
GLUCOSE, UA: NEGATIVE mg/dL
Hgb urine dipstick: NEGATIVE
KETONES UR: NEGATIVE mg/dL
Leukocytes, UA: NEGATIVE
Nitrite: NEGATIVE
PROTEIN: NEGATIVE mg/dL
Specific Gravity, Urine: 1.01 (ref 1.005–1.030)
Urobilinogen, UA: 0.2 mg/dL (ref 0.0–1.0)
pH: 6 (ref 5.0–8.0)

## 2015-03-23 LAB — PREGNANCY, URINE: Preg Test, Ur: NEGATIVE

## 2015-03-23 LAB — LIPASE, BLOOD: LIPASE: 18 U/L — AB (ref 22–51)

## 2015-03-23 MED ORDER — MUPIROCIN CALCIUM 2 % EX CREA: 1.0000 "application " | TOPICAL_CREAM | Freq: Two times a day (BID) | CUTANEOUS | Status: DC

## 2015-03-23 MED ORDER — PROMETHAZINE HCL 25 MG PO TABS: 25.0000 mg | ORAL_TABLET | Freq: Four times a day (QID) | ORAL | Status: DC | PRN

## 2015-03-23 MED ORDER — ONDANSETRON 8 MG PO TBDP
8.0000 mg | ORAL_TABLET | Freq: Once | ORAL | Status: AC
  Administered 2015-03-23: 8 mg via ORAL
  Filled 2015-03-23: qty 1

## 2015-03-23 MED ORDER — ACETAMINOPHEN 500 MG PO TABS
1000.0000 mg | ORAL_TABLET | Freq: Once | ORAL | Status: AC
  Administered 2015-03-23: 1000 mg via ORAL
  Filled 2015-03-23: qty 2

## 2015-03-23 NOTE — ED Notes (Signed)
Pt states that she is concerned that she is pregnant due to nausea, abd pain, and late menstruation.  Requesting a pregnancy test

## 2015-03-23 NOTE — ED Notes (Signed)
Pt reports that she "is not feeling right" since popping what she thought was a pimple on the right side of nose about 10pm.  Pt states that she is having abdominal pain, and generalized weakness.

## 2015-03-23 NOTE — Discharge Instructions (Signed)
Abdominal Pain, Women °Abdominal (stomach, pelvic, or belly) pain can be caused by many things. It is important to tell your doctor: °· The location of the pain. °· Does it come and go or is it present all the time? °· Are there things that start the pain (eating certain foods, exercise)? °· Are there other symptoms associated with the pain (fever, nausea, vomiting, diarrhea)? °All of this is helpful to know when trying to find the cause of the pain. °CAUSES  °· Stomach: virus or bacteria infection, or ulcer. °· Intestine: appendicitis (inflamed appendix), regional ileitis (Crohn's disease), ulcerative colitis (inflamed colon), irritable bowel syndrome, diverticulitis (inflamed diverticulum of the colon), or cancer of the stomach or intestine. °· Gallbladder disease or stones in the gallbladder. °· Kidney disease, kidney stones, or infection. °· Pancreas infection or cancer. °· Fibromyalgia (pain disorder). °· Diseases of the female organs: °¨ Uterus: fibroid (non-cancerous) tumors or infection. °¨ Fallopian tubes: infection or tubal pregnancy. °¨ Ovary: cysts or tumors. °¨ Pelvic adhesions (scar tissue). °¨ Endometriosis (uterus lining tissue growing in the pelvis and on the pelvic organs). °¨ Pelvic congestion syndrome (female organs filling up with blood just before the menstrual period). °¨ Pain with the menstrual period. °¨ Pain with ovulation (producing an egg). °¨ Pain with an IUD (intrauterine device, birth control) in the uterus. °¨ Cancer of the female organs. °· Functional pain (pain not caused by a disease, may improve without treatment). °· Psychological pain. °· Depression. °DIAGNOSIS  °Your doctor will decide the seriousness of your pain by doing an examination. °· Blood tests. °· X-rays. °· Ultrasound. °· CT scan (computed tomography, special type of X-ray). °· MRI (magnetic resonance imaging). °· Cultures, for infection. °· Barium enema (dye inserted in the large intestine, to better view it with  X-rays). °· Colonoscopy (looking in intestine with a lighted tube). °· Laparoscopy (minor surgery, looking in abdomen with a lighted tube). °· Major abdominal exploratory surgery (looking in abdomen with a large incision). °TREATMENT  °The treatment will depend on the cause of the pain.  °· Many cases can be observed and treated at home. °· Over-the-counter medicines recommended by your caregiver. °· Prescription medicine. °· Antibiotics, for infection. °· Birth control pills, for painful periods or for ovulation pain. °· Hormone treatment, for endometriosis. °· Nerve blocking injections. °· Physical therapy. °· Antidepressants. °· Counseling with a psychologist or psychiatrist. °· Minor or major surgery. °HOME CARE INSTRUCTIONS  °· Do not take laxatives, unless directed by your caregiver. °· Take over-the-counter pain medicine only if ordered by your caregiver. Do not take aspirin because it can cause an upset stomach or bleeding. °· Try a clear liquid diet (broth or water) as ordered by your caregiver. Slowly move to a bland diet, as tolerated, if the pain is related to the stomach or intestine. °· Have a thermometer and take your temperature several times a day, and record it. °· Bed rest and sleep, if it helps the pain. °· Avoid sexual intercourse, if it causes pain. °· Avoid stressful situations. °· Keep your follow-up appointments and tests, as your caregiver orders. °· If the pain does not go away with medicine or surgery, you may try: °¨ Acupuncture. °¨ Relaxation exercises (yoga, meditation). °¨ Group therapy. °¨ Counseling. °SEEK MEDICAL CARE IF:  °· You notice certain foods cause stomach pain. °· Your home care treatment is not helping your pain. °· You need stronger pain medicine. °· You want your IUD removed. °· You feel faint or   lightheaded. °· You develop nausea and vomiting. °· You develop a rash. °· You are having side effects or an allergy to your medicine. °SEEK IMMEDIATE MEDICAL CARE IF:  °· Your  pain does not go away or gets worse. °· You have a fever. °· Your pain is felt only in portions of the abdomen. The right side could possibly be appendicitis. The left lower portion of the abdomen could be colitis or diverticulitis. °· You are passing blood in your stools (bright red or black tarry stools, with or without vomiting). °· You have blood in your urine. °· You develop chills, with or without a fever. °· You pass out. °MAKE SURE YOU:  °· Understand these instructions. °· Will watch your condition. °· Will get help right away if you are not doing well or get worse. °Document Released: 06/16/2007 Document Revised: 01/03/2014 Document Reviewed: 07/06/2009 °[employer]® Patient Information ©2015 [employer]. This information is not intended to replace advice given to you by your health care provider. Make sure you discuss any questions you have with your health care provider. ° °

## 2015-03-23 NOTE — ED Provider Notes (Signed)
CSN: [id number]     Arrival date & time 03/22/15  2256 History   First MD Initiated Contact with Patient 03/23/15 0041     Chief Complaint  Patient presents with  . Insect Bite     (Consider location/radiation/quality/duration/timing/severity/associated sxs/prior Treatment) The history is provided by the patient.   Kirti Carl is a 24 y.o. female with a past medical history of peripheral neuropathy, anxiety with depression and chronic pelvic pain presenting with several complaints.  She describes she was attempting to "pop" a pimple on her right cheek around 10 PM this evening in she developed nausea, headache and left sided abdominal pain which radiates around her flank to her mid back.  She reports she has had nausea for the past several days and is concerned she may be pregnant.  Her LMP was 02/19/15.  She denies low pelvic pain at this time, her left upper pain is described as crampy in character.  She reports having a firmer than normal stool several hours ago, but denies any significant constipation.  She also denies dysuria, hematuria, fevers or chills, vomiting and denies vaginal discharge.  She reports occasional EtOH use, not in recent months.  She denies chest pain and shortness of breath, denies acid reflux.  She has had no medications prior to arrival.    Past Medical History  Diagnosis Date  . Neuropathy     idopathic  . Anxiety   . Depression   . Schizo-affective schizophrenia    Past Surgical History  Procedure Laterality Date  . Dilation and curettage of uterus     No family history on file. History  Substance Use Topics  . Smoking status: Current Every Day Smoker -- 1.00 packs/day    Types: Cigarettes  . Smokeless tobacco: Not on file  . Alcohol Use: Yes     Comment: occasionally    OB History    Gravida Para Term Preterm AB TAB SAB Ectopic Multiple Living            1     Review of Systems  Constitutional: Negative for fever and chills.  HENT:  Negative for congestion and sore throat.   Eyes: Negative.   Respiratory: Negative for chest tightness and shortness of breath.   Cardiovascular: Negative for chest pain.  Gastrointestinal: Positive for nausea and abdominal pain. Negative for vomiting, diarrhea and constipation.  Genitourinary: Negative.  Negative for dysuria, hematuria, vaginal discharge and vaginal pain.  Musculoskeletal: Negative for joint swelling, arthralgias and neck pain.  Skin: Positive for wound. Negative for rash.  Neurological: Negative for dizziness, weakness, light-headedness, numbness and headaches.  Psychiatric/Behavioral: Negative.       Allergies  Morphine and related  Home Medications   Prior to Admission medications   Medication Sig Start Date End Date Taking? Authorizing Provider  ALPRAZolam Prudy Feeler) 1 MG tablet Take 1 mg by mouth 4 (four) times daily.   Yes Historical Provider, MD  ARIPiprazole (ABILIFY) 2 MG tablet Take 2 mg by mouth at bedtime.    Yes Historical Provider, MD  gabapentin (NEURONTIN) 300 MG capsule Take 300 mg by mouth 3 (three) times daily.   Yes Historical Provider, MD  HYDROcodone-acetaminophen (NORCO/VICODIN) 5-325 MG per tablet Take 1 tablet by mouth 3 (three) times daily as needed (pain).   Yes Historical Provider, MD  sertraline (ZOLOFT) 100 MG tablet Take 100 mg by mouth daily.   Yes Historical Provider, MD  traZODone (DESYREL) 50 MG tablet Take 50 mg by mouth at bedtime  as needed for sleep.   Yes Historical Provider, MD  cyclobenzaprine (FLEXERIL) 10 MG tablet Take 10 mg by mouth 3 (three) times daily as needed for muscle spasms.    Historical Provider, MD  meloxicam (MOBIC) 15 MG tablet Take 1 tablet (15 mg total) by mouth daily. 01/21/15   [name], DO  mupirocin cream (BACTROBAN) 2 % Apply 1 application topically 2 (two) times daily. 03/23/15   [name], PA-C  promethazine (PHENERGAN) 25 MG tablet Take 1 tablet (25 mg total) by mouth every 6 (six) hours as needed  for nausea or vomiting. 03/23/15   [name], PA-C   BP 114/72 mmHg  Pulse 66  Temp(Src) 98.7 F (37.1 C) (Oral)  Resp 20  SpO2 98%  LMP 02/19/2015 Physical Exam  Constitutional: She appears well-developed and well-nourished.  HENT:  Head: Normocephalic and atraumatic.  Eyes: Conjunctivae are normal.  Neck: Normal range of motion.  Cardiovascular: Normal rate, regular rhythm, normal heart sounds and intact distal pulses.   Pulmonary/Chest: Effort normal and breath sounds normal. She has no wheezes.  Abdominal: Soft. Bowel sounds are normal. There is no hepatosplenomegaly. There is tenderness in the left upper quadrant. There is no rebound, no guarding and no CVA tenderness.  Musculoskeletal: Normal range of motion.  Neurological: She is alert.  Skin: Skin is warm and dry.  Patient has a small papule right cheek near nostril.  There is no surrounding erythema, no fluctuance, with an approximate 3 mm area of induration.  There is no drainage from this pimple.  Psychiatric: She has a normal mood and affect.  Nursing note and vitals reviewed.   ED Course  Procedures (including critical care time) Labs Review Labs Reviewed  CBC WITH DIFFERENTIAL/PLATELET - Abnormal; Notable for the following:    WBC 12.5 (*)    Neutro Abs 8.0 (*)    All other components within normal limits  COMPREHENSIVE METABOLIC PANEL - Abnormal; Notable for the following:    Potassium 3.4 (*)    Glucose, Bld 125 (*)    Calcium 8.8 (*)    All other components within normal limits  LIPASE, BLOOD - Abnormal; Notable for the following:    Lipase 18 (*)    All other components within normal limits  PREGNANCY, URINE  URINALYSIS, ROUTINE W REFLEX MICROSCOPIC (NOT AT [facility])    Imaging Review No results found.   EKG Interpretation None      MDM   Final diagnoses:  Papule of skin  Left upper quadrant pain    Patients labs and/or radiological studies were reviewed and considered during the medical  decision making and disposition process.  Results were also discussed with patient.  Pt with multiple complaints, primarily concerned with papule on right cheek with perceived facial swelling, although no swelling observed on exam.  Her abd pain appears benign, labs unremarkable.  No acute abdomen on exam, including re -exam prior to dc home.  She was prescribed mupirocin for her facial pimple. Phenergan if she continues to have nausea. Advised recheck in 24 hours if her symptoms persist or worsen including radiation or migration of pain, fevers, new sx.  Pt understands and agrees with plan.  No distress at time of dc.  Pt has no lower abdominal pain on exam, especially no rlq pain, doubt early appendicitis. Labs negative except for slight elevation in wbc which prob acute reaction to emesis. Labs negative for acute pancreatitis, hepatitis, doubt gallbladder disease. No tenderness ruq.   Raynelle Fanning  Aleksey Newbern, PA-C 03/23/15 1255  Devoria Albe, MD 04/11/15 [phone]

## 2015-04-09 ENCOUNTER — Emergency Department (HOSPITAL_COMMUNITY)
Admission: EM | Admit: 2015-04-09 | Discharge: 2015-04-09 | Disposition: A | Discharge status: Home / Self Care | Payer: Self-pay | Attending: Emergency Medicine | Admitting: Emergency Medicine

## 2015-04-09 ENCOUNTER — Encounter (HOSPITAL_COMMUNITY): Payer: Self-pay | Admitting: Emergency Medicine

## 2015-04-09 DIAGNOSIS — Y9389 Activity, other specified: Secondary | ICD-10-CM | POA: Insufficient documentation

## 2015-04-09 DIAGNOSIS — F250 Schizoaffective disorder, bipolar type: Secondary | ICD-10-CM | POA: Insufficient documentation

## 2015-04-09 DIAGNOSIS — Z720 Tobacco use: Secondary | ICD-10-CM | POA: Insufficient documentation

## 2015-04-09 DIAGNOSIS — T63481A Toxic effect of venom of other arthropod, accidental (unintentional), initial encounter: Secondary | ICD-10-CM

## 2015-04-09 DIAGNOSIS — Z791 Long term (current) use of non-steroidal anti-inflammatories (NSAID): Secondary | ICD-10-CM | POA: Insufficient documentation

## 2015-04-09 DIAGNOSIS — Y9289 Other specified places as the place of occurrence of the external cause: Secondary | ICD-10-CM | POA: Insufficient documentation

## 2015-04-09 DIAGNOSIS — Z79899 Other long term (current) drug therapy: Secondary | ICD-10-CM | POA: Insufficient documentation

## 2015-04-09 DIAGNOSIS — F329 Major depressive disorder, single episode, unspecified: Secondary | ICD-10-CM | POA: Insufficient documentation

## 2015-04-09 DIAGNOSIS — F419 Anxiety disorder, unspecified: Secondary | ICD-10-CM | POA: Insufficient documentation

## 2015-04-09 DIAGNOSIS — L03114 Cellulitis of left upper limb: Secondary | ICD-10-CM | POA: Insufficient documentation

## 2015-04-09 DIAGNOSIS — G629 Polyneuropathy, unspecified: Secondary | ICD-10-CM | POA: Insufficient documentation

## 2015-04-09 DIAGNOSIS — T63441A Toxic effect of venom of bees, accidental (unintentional), initial encounter: Secondary | ICD-10-CM | POA: Insufficient documentation

## 2015-04-09 DIAGNOSIS — Y998 Other external cause status: Secondary | ICD-10-CM | POA: Insufficient documentation

## 2015-04-09 MED ORDER — SULFAMETHOXAZOLE-TRIMETHOPRIM 800-160 MG PO TABS
1.0000 | ORAL_TABLET | Freq: Once | ORAL | Status: AC
  Administered 2015-04-09: 1 via ORAL
  Filled 2015-04-09: qty 1

## 2015-04-09 MED ORDER — OXYCODONE-ACETAMINOPHEN 5-325 MG PO TABS
1.0000 | ORAL_TABLET | Freq: Once | ORAL | Status: AC
  Administered 2015-04-09: 1 via ORAL
  Filled 2015-04-09: qty 1

## 2015-04-09 MED ORDER — IBUPROFEN 400 MG PO TABS
600.0000 mg | ORAL_TABLET | Freq: Once | ORAL | Status: AC
  Administered 2015-04-09: 600 mg via ORAL
  Filled 2015-04-09: qty 2

## 2015-04-09 MED ORDER — SULFAMETHOXAZOLE-TRIMETHOPRIM 800-160 MG PO TABS
1.0000 | ORAL_TABLET | Freq: Two times a day (BID) | ORAL | Status: AC
Start: 2015-04-09 — End: 2015-04-16

## 2015-04-09 NOTE — Discharge Instructions (Signed)

## 2015-04-09 NOTE — ED Notes (Addendum)
Pt states she was stung by bee, possibly yellow jacket around 1030 tonight. No severe reactions previously. Sting is to the left wrist. Swelling noted to left forearm with redness. States took benadryl and advil prior to arriving.

## 2015-04-09 NOTE — ED Notes (Signed)
Discharge instructions given, pt demonstrated teach back and verbal understanding. No concerns voiced.  

## 2015-04-16 NOTE — ED Provider Notes (Signed)
CSN: [id number]     Arrival date & time 04/09/15  0055 History   First MD Initiated Contact with Patient 04/09/15 0111     Chief Complaint  Patient presents with  . Insect Bite     (Consider location/radiation/quality/duration/timing/severity/associated sxs/prior Treatment) HPI   24 year old female with increasing pain and redness of her left wrist/forearm. Patient reports being stung by what appear to be loss around 10:30 AM. Increasing pain, swelling redness extending up her forearm since then. No fevers or chills. No history of diabetes or other immunocompromising state. No intervention prior to arrival.  Past Medical History  Diagnosis Date  . Neuropathy     idopathic  . Anxiety   . Depression   . Schizo-affective schizophrenia    Past Surgical History  Procedure Laterality Date  . Dilation and curettage of uterus     History reviewed. No pertinent family history. Social History  Substance Use Topics  . Smoking status: Current Every Day Smoker -- 1.00 packs/day    Types: Cigarettes  . Smokeless tobacco: None  . Alcohol Use: Yes     Comment: occasionally    OB History    Gravida Para Term Preterm AB TAB SAB Ectopic Multiple Living            1     Review of Systems  All systems reviewed and negative, other than as noted in HPI.   Allergies  Morphine and related  Home Medications   Prior to Admission medications   Medication Sig Start Date End Date Taking? Authorizing Provider  ALPRAZolam Prudy Feeler) 1 MG tablet Take 1 mg by mouth 4 (four) times daily.    Historical Provider, MD  ARIPiprazole (ABILIFY) 2 MG tablet Take 2 mg by mouth at bedtime.     Historical Provider, MD  cyclobenzaprine (FLEXERIL) 10 MG tablet Take 10 mg by mouth 3 (three) times daily as needed for muscle spasms.    Historical Provider, MD  gabapentin (NEURONTIN) 300 MG capsule Take 300 mg by mouth 3 (three) times daily.    Historical Provider, MD  HYDROcodone-acetaminophen (NORCO/VICODIN) 5-325  MG per tablet Take 1 tablet by mouth 3 (three) times daily as needed (pain).    Historical Provider, MD  meloxicam (MOBIC) 15 MG tablet Take 1 tablet (15 mg total) by mouth daily. 01/21/15   [name], DO  mupirocin cream (BACTROBAN) 2 % Apply 1 application topically 2 (two) times daily. 03/23/15   [name], PA-C  promethazine (PHENERGAN) 25 MG tablet Take 1 tablet (25 mg total) by mouth every 6 (six) hours as needed for nausea or vomiting. 03/23/15   [name], PA-C  sertraline (ZOLOFT) 100 MG tablet Take 100 mg by mouth daily.    Historical Provider, MD  sulfamethoxazole-trimethoprim (BACTRIM DS,SEPTRA DS) 800-160 MG per tablet Take 1 tablet by mouth 2 (two) times daily. 04/09/15 04/16/15  Raeford Razor, MD  traZODone (DESYREL) 50 MG tablet Take 50 mg by mouth at bedtime as needed for sleep.    Historical Provider, MD   BP 140/67 mmHg  Pulse 73  Temp(Src) 98 F (36.7 C) (Oral)  Resp 16  Ht  (1.702 m)  Wt 276 lb (125.193 kg)  BMI 43.22 kg/m2  SpO2 100%  LMP 03/25/2015 Physical Exam  Constitutional: She appears well-developed and well-nourished. No distress.  HENT:  Head: Normocephalic and atraumatic.  Eyes: Conjunctivae are normal. Right eye exhibits no discharge. Left eye exhibits no discharge.  Neck: Neck supple.  Cardiovascular: Normal rate,  regular rhythm and normal heart sounds.  Exam reveals no gallop and no friction rub.   No murmur heard. Pulmonary/Chest: Effort normal and breath sounds normal. No respiratory distress.  Abdominal: Soft. She exhibits no distension. There is no tenderness.  Musculoskeletal: She exhibits no edema or tenderness.  Neurological: She is alert.  Skin: Skin is warm and dry.  Small punctate area to distal left wrist consistent with insect sting. Extending proximally from this she has mild soft tissue swelling, erythema, increased warmth and tenderness to palpation consistent with cellulitis. This extends to the proximal third of the forearm. She  is able to actively range the wrist and elbow without apparent discomfort. She is neurovascularly intact. The skin changes are not circumferential.  Psychiatric: She has a normal mood and affect. Her behavior is normal. Thought content normal.  Nursing note and vitals reviewed.   ED Course  Procedures (including critical care time) Labs Review Labs Reviewed - No data to display  Imaging Review No results found. I, Ramses Klecka, personally reviewed and evaluated these images and lab results as part of my medical decision-making.   EKG Interpretation None      MDM   Final diagnoses:  Insect sting, accidental or unintentional, initial encounter  Cellulitis of forearm, left    24 year old female with an insect sting to her right forearm. It appears she may be developing some cellulitis. Placed on antibiotics. She is afebrile nontoxic. I feel she is appropriate for outpatient treatment.    Raeford Razor, MD 04/16/15 1147

## 2015-05-16 ENCOUNTER — Emergency Department (HOSPITAL_COMMUNITY)
Admission: EM | Admit: 2015-05-16 | Discharge: 2015-05-17 | Disposition: A | Discharge status: Home / Self Care | Payer: Self-pay | Attending: Emergency Medicine | Admitting: Emergency Medicine

## 2015-05-16 ENCOUNTER — Encounter (HOSPITAL_COMMUNITY): Payer: Self-pay | Admitting: Emergency Medicine

## 2015-05-16 DIAGNOSIS — N938 Other specified abnormal uterine and vaginal bleeding: Secondary | ICD-10-CM | POA: Insufficient documentation

## 2015-05-16 DIAGNOSIS — R3915 Urgency of urination: Secondary | ICD-10-CM | POA: Insufficient documentation

## 2015-05-16 DIAGNOSIS — G629 Polyneuropathy, unspecified: Secondary | ICD-10-CM | POA: Insufficient documentation

## 2015-05-16 DIAGNOSIS — R509 Fever, unspecified: Secondary | ICD-10-CM | POA: Insufficient documentation

## 2015-05-16 DIAGNOSIS — R350 Frequency of micturition: Secondary | ICD-10-CM | POA: Insufficient documentation

## 2015-05-16 DIAGNOSIS — R102 Pelvic and perineal pain: Secondary | ICD-10-CM

## 2015-05-16 DIAGNOSIS — F250 Schizoaffective disorder, bipolar type: Secondary | ICD-10-CM | POA: Insufficient documentation

## 2015-05-16 DIAGNOSIS — Z3202 Encounter for pregnancy test, result negative: Secondary | ICD-10-CM | POA: Insufficient documentation

## 2015-05-16 DIAGNOSIS — F419 Anxiety disorder, unspecified: Secondary | ICD-10-CM | POA: Insufficient documentation

## 2015-05-16 DIAGNOSIS — F329 Major depressive disorder, single episode, unspecified: Secondary | ICD-10-CM | POA: Insufficient documentation

## 2015-05-16 DIAGNOSIS — Z791 Long term (current) use of non-steroidal anti-inflammatories (NSAID): Secondary | ICD-10-CM | POA: Insufficient documentation

## 2015-05-16 DIAGNOSIS — Z792 Long term (current) use of antibiotics: Secondary | ICD-10-CM | POA: Insufficient documentation

## 2015-05-16 DIAGNOSIS — R300 Dysuria: Secondary | ICD-10-CM | POA: Insufficient documentation

## 2015-05-16 DIAGNOSIS — Z720 Tobacco use: Secondary | ICD-10-CM | POA: Insufficient documentation

## 2015-05-16 DIAGNOSIS — Z79899 Other long term (current) drug therapy: Secondary | ICD-10-CM | POA: Insufficient documentation

## 2015-05-16 NOTE — ED Notes (Signed)
Pt c/o abd fluttering, excessive thirst, sob, and headache for a few days.

## 2015-05-17 ENCOUNTER — Other Ambulatory Visit (HOSPITAL_COMMUNITY): Payer: Self-pay | Admitting: Emergency Medicine

## 2015-05-17 ENCOUNTER — Ambulatory Visit (HOSPITAL_COMMUNITY)
Admit: 2015-05-17 | Discharge: 2015-05-17 | Disposition: A | Discharge status: Home / Self Care | Payer: Self-pay | Source: Ambulatory Visit | Attending: Emergency Medicine | Admitting: Emergency Medicine

## 2015-05-17 DIAGNOSIS — R102 Pelvic and perineal pain: Secondary | ICD-10-CM

## 2015-05-17 DIAGNOSIS — R1031 Right lower quadrant pain: Secondary | ICD-10-CM | POA: Insufficient documentation

## 2015-05-17 LAB — CBC WITH DIFFERENTIAL/PLATELET
Basophils Absolute: 0 10*3/uL (ref 0.0–0.1)
Basophils Relative: 0 %
Eosinophils Absolute: 0.3 10*3/uL (ref 0.0–0.7)
Eosinophils Relative: 2 %
HEMATOCRIT: 39.6 % (ref 36.0–46.0)
HEMOGLOBIN: 13.8 g/dL (ref 12.0–15.0)
LYMPHS ABS: 3.9 10*3/uL (ref 0.7–4.0)
LYMPHS PCT: 28 %
MCH: 31.6 pg (ref 26.0–34.0)
MCHC: 34.8 g/dL (ref 30.0–36.0)
MCV: 90.6 fL (ref 78.0–100.0)
MONOS PCT: 4 %
Monocytes Absolute: 0.6 10*3/uL (ref 0.1–1.0)
NEUTROS ABS: 9.2 10*3/uL — AB (ref 1.7–7.7)
Neutrophils Relative %: 66 %
Platelets: 242 10*3/uL (ref 150–400)
RBC: 4.37 MIL/uL (ref 3.87–5.11)
RDW: 12.6 % (ref 11.5–15.5)
WBC: 14 10*3/uL — AB (ref 4.0–10.5)

## 2015-05-17 LAB — COMPREHENSIVE METABOLIC PANEL
ALT: 53 U/L (ref 14–54)
ANION GAP: 6 (ref 5–15)
AST: 27 U/L (ref 15–41)
Albumin: 3.8 g/dL (ref 3.5–5.0)
Alkaline Phosphatase: 116 U/L (ref 38–126)
BUN: 9 mg/dL (ref 6–20)
CHLORIDE: 106 mmol/L (ref 101–111)
CO2: 26 mmol/L (ref 22–32)
Calcium: 8.6 mg/dL — ABNORMAL LOW (ref 8.9–10.3)
Creatinine, Ser: 0.72 mg/dL (ref 0.44–1.00)
GFR calc non Af Amer: >60 mL/min (ref >60)
Glucose, Bld: 112 mg/dL — ABNORMAL HIGH (ref 65–99)
POTASSIUM: 3.4 mmol/L — AB (ref 3.5–5.1)
SODIUM: 138 mmol/L (ref 135–145)
Total Bilirubin: 0.3 mg/dL (ref 0.3–1.2)
Total Protein: 6.3 g/dL — ABNORMAL LOW (ref 6.5–8.1)

## 2015-05-17 LAB — HCG, QUANTITATIVE, PREGNANCY: hCG, Beta Chain, Quant, S: <1 m[IU]/mL (ref <5)

## 2015-05-17 LAB — WET PREP, GENITAL
Trich, Wet Prep: NONE SEEN
YEAST WET PREP: NONE SEEN

## 2015-05-17 LAB — URINALYSIS, ROUTINE W REFLEX MICROSCOPIC
BILIRUBIN URINE: NEGATIVE
Glucose, UA: NEGATIVE mg/dL
KETONES UR: NEGATIVE mg/dL
Leukocytes, UA: NEGATIVE
NITRITE: NEGATIVE
PROTEIN: NEGATIVE mg/dL
Specific Gravity, Urine: 1.01 (ref 1.005–1.030)
UROBILINOGEN UA: 0.2 mg/dL (ref 0.0–1.0)
pH: 6.5 (ref 5.0–8.0)

## 2015-05-17 LAB — URINE MICROSCOPIC-ADD ON

## 2015-05-17 LAB — PREGNANCY, URINE: PREG TEST UR: NEGATIVE

## 2015-05-17 MED ORDER — IBUPROFEN 800 MG PO TABS
800.0000 mg | ORAL_TABLET | Freq: Once | ORAL | Status: AC
  Administered 2015-05-17: 800 mg via ORAL
  Filled 2015-05-17: qty 1

## 2015-05-17 MED ORDER — TRAMADOL HCL 50 MG PO TABS
50.0000 mg | ORAL_TABLET | Freq: Once | ORAL | Status: AC
  Administered 2015-05-17: 50 mg via ORAL
  Filled 2015-05-17: qty 1

## 2015-05-17 NOTE — Discharge Instructions (Signed)
Pelvic Pain Female pelvic pain can be caused by many different things and start from a variety of places. Pelvic pain refers to pain that is located in the lower half of the abdomen and between your hips. The pain may occur over a short period of time (acute) or may be reoccurring (chronic). The cause of pelvic pain may be related to disorders affecting the female reproductive organs (gynecologic), but it may also be related to the bladder, kidney stones, an intestinal complication, or muscle or skeletal problems. Getting help right away for pelvic pain is important, especially if there has been severe, sharp, or a sudden onset of unusual pain. It is also important to get help right away because some types of pelvic pain can be life threatening.  CAUSES  Below are only some of the causes of pelvic pain. The causes of pelvic pain can be in one of several categories.   Gynecologic.  Pelvic inflammatory disease.  Sexually transmitted infection.  Ovarian cyst or a twisted ovarian ligament (ovarian torsion).  Uterine lining that grows outside the uterus (endometriosis).  Fibroids, cysts, or tumors.  Ovulation.  Pregnancy.  Pregnancy that occurs outside the uterus (ectopic pregnancy).  Miscarriage.  Labor.  Abruption of the placenta or ruptured uterus.  Infection.  Uterine infection (endometritis).  Bladder infection.  Diverticulitis.  Miscarriage related to a uterine infection (septic abortion).  Bladder.  Inflammation of the bladder (cystitis).  Kidney stone(s).  Gastrointestinal.  Constipation.  Diverticulitis.  Neurologic.  Trauma.  Feeling pelvic pain because of mental or emotional causes (psychosomatic).  Cancers of the bowel or pelvis. EVALUATION  Your caregiver will want to take a careful history of your concerns. This includes recent changes in your health, a careful gynecologic history of your periods (menses), and a sexual history. Obtaining your family  history and medical history is also important. Your caregiver may suggest a pelvic exam. A pelvic exam will help identify the location and severity of the pain. It also helps in the evaluation of which organ system may be involved. In order to identify the cause of the pelvic pain and be properly treated, your caregiver may order tests. These tests may include:   A pregnancy test.  Pelvic ultrasonography.  An X-ray exam of the abdomen.  A urinalysis or evaluation of vaginal discharge.  Blood tests. HOME CARE INSTRUCTIONS   Only take over-the-counter or prescription medicines for pain, discomfort, or fever as directed by your caregiver.   Rest as directed by your caregiver.   Eat a balanced diet.   Drink enough fluids to make your urine clear or pale yellow, or as directed.   Avoid sexual intercourse if it causes pain.   Apply warm or cold compresses to the lower abdomen depending on which one helps the pain.   Avoid stressful situations.   Keep a journal of your pelvic pain. Write down when it started, where the pain is located, and if there are things that seem to be associated with the pain, such as food or your menstrual cycle.  Follow up with your caregiver as directed.  SEEK MEDICAL CARE IF:  Your medicine does not help your pain.  You have abnormal vaginal discharge. SEEK IMMEDIATE MEDICAL CARE IF:   You have heavy bleeding from the vagina.   Your pelvic pain increases.   You feel light-headed or faint.   You have chills.   You have pain with urination or blood in your urine.   You have uncontrolled diarrhea   or vomiting.   You have a fever or persistent symptoms for more than 3 days.  You have a fever and your symptoms suddenly get worse.   You are being physically or sexually abused.  MAKE SURE YOU:  Understand these instructions.  Will watch your condition.  Will get help if you are not doing well or get worse. Document Released:  07/16/2004 Document Revised: 01/03/2014 Document Reviewed: 12/09/2011 [employer] Patient Information 2015 [employer]. This information is not intended to replace advice given to you by your health care provider. Make sure you discuss any questions you have with your health care provider.  

## 2015-05-17 NOTE — ED Provider Notes (Signed)
CSN: [id number]     Arrival date & time 05/16/15  2309 History   First MD Initiated Contact with Patient 05/16/15 2321     Chief Complaint  Patient presents with  . Abdominal Cramping     (Consider location/radiation/quality/duration/timing/severity/associated sxs/prior Treatment) HPI   Heidi [name] is a 24 y.o. female who presents to the Emergency Department complaining of lower abdominal "cramping and fluttering" intermittently for three days.  She describes the pain as a "crampy" sensation and associated with urinary pressure, frequency and "spotty" vaginal bleeding.  She also reports having excessive thirst and intermittent headaches.  She states that she had an Essure birth control device surgically implanted in 2014 and she is concerned about possible ectopic pregnancy. She denies fever, nausea, vomiting, chest pain, shortness of breath, flank pain or bloody urine.      Past Medical History  Diagnosis Date  . Neuropathy     idopathic  . Anxiety   . Depression   . Schizo-affective schizophrenia    Past Surgical History  Procedure Laterality Date  . Dilation and curettage of uterus     History reviewed. No pertinent family history. Social History  Substance Use Topics  . Smoking status: Current Every Day Smoker -- 1.00 packs/day    Types: Cigarettes  . Smokeless tobacco: None  . Alcohol Use: Yes     Comment: occasionally    OB History    Gravida Para Term Preterm AB TAB SAB Ectopic Multiple Living            1     Review of Systems  Constitutional: Positive for fever. Negative for chills and appetite change.  HENT: Negative for sore throat.   Respiratory: Negative for shortness of breath.   Cardiovascular: Negative for chest pain.  Gastrointestinal: Positive for abdominal pain. Negative for nausea, vomiting and blood in stool.  Genitourinary: Positive for dysuria, urgency, frequency and vaginal bleeding. Negative for flank pain, decreased urine volume, vaginal  discharge and difficulty urinating.  Musculoskeletal: Negative for back pain.  Skin: Negative for color change and rash.  Neurological: Negative for dizziness, weakness and numbness.  Hematological: Negative for adenopathy.  Psychiatric/Behavioral: Negative for confusion.  All other systems reviewed and are negative.     Allergies  Morphine and related  Home Medications   Prior to Admission medications   Medication Sig Start Date End Date Taking? Authorizing Provider  sertraline (ZOLOFT) 100 MG tablet Take 100 mg by mouth daily.   Yes Historical Provider, MD  ALPRAZolam Prudy Feeler) 1 MG tablet Take 1 mg by mouth 4 (four) times daily.    Historical Provider, MD  ARIPiprazole (ABILIFY) 2 MG tablet Take 2 mg by mouth at bedtime.     Historical Provider, MD  cyclobenzaprine (FLEXERIL) 10 MG tablet Take 10 mg by mouth 3 (three) times daily as needed for muscle spasms.    Historical Provider, MD  gabapentin (NEURONTIN) 300 MG capsule Take 300 mg by mouth 3 (three) times daily.    Historical Provider, MD  HYDROcodone-acetaminophen (NORCO/VICODIN) 5-325 MG per tablet Take 1 tablet by mouth 3 (three) times daily as needed (pain).    Historical Provider, MD  meloxicam (MOBIC) 15 MG tablet Take 1 tablet (15 mg total) by mouth daily. 01/21/15   [name], DO  mupirocin cream (BACTROBAN) 2 % Apply 1 application topically 2 (two) times daily. 03/23/15   [name], PA-C  promethazine (PHENERGAN) 25 MG tablet Take 1 tablet (25 mg total) by mouth every 6 (  six) hours as needed for nausea or vomiting. 03/23/15   [name], PA-C  traZODone (DESYREL) 50 MG tablet Take 50 mg by mouth at bedtime as needed for sleep.    Historical Provider, MD   BP 138/85 mmHg  Pulse 88  Temp(Src) 99.1 F (37.3 C)  Resp 20  Ht  (1.702 m)  Wt 270 lb (122.471 kg)  BMI 42.28 kg/m2  SpO2 98%  LMP 05/09/2015    Physical Exam  Constitutional: She is oriented to person, place, and time. She appears well-developed and  well-nourished. No distress.  HENT:  Head: Normocephalic and atraumatic.  Mouth/Throat: Oropharynx is clear and moist.  Neck: Normal range of motion. Neck supple.  Cardiovascular: Normal rate, regular rhythm and normal heart sounds.   Pulmonary/Chest: Effort normal and breath sounds normal. No respiratory distress.  Abdominal: Soft. Normal appearance. She exhibits no distension. There is tenderness in the right lower quadrant and suprapubic area. There is no rigidity, no rebound, no guarding, no CVA tenderness and no tenderness at McBurney's point.  Genitourinary: Uterus normal. There is no rash on the right labia. There is no rash on the left labia. Cervix exhibits no motion tenderness and no discharge. Right adnexum displays tenderness. Right adnexum displays no mass. Left adnexum displays no mass and no tenderness. No tenderness or bleeding in the vagina. No foreign body around the vagina. No vaginal discharge found.  Mild to moderate right adnexal tenderness w/o mass  Musculoskeletal: Normal range of motion.  Lymphadenopathy:    She has no cervical adenopathy.  Neurological: She is alert and oriented to person, place, and time. Coordination normal.  Skin: Skin is warm. No rash noted.  Psychiatric: She has a normal mood and affect.  Nursing note and vitals reviewed.   ED Course  Procedures (including critical care time) Labs Review Labs Reviewed  URINALYSIS, ROUTINE W REFLEX MICROSCOPIC (NOT AT [facility]) - Abnormal; Notable for the following:    Hgb urine dipstick TRACE (*)    All other components within normal limits  COMPREHENSIVE METABOLIC PANEL - Abnormal; Notable for the following:    Potassium 3.4 (*)    Glucose, Bld 112 (*)    Calcium 8.6 (*)    Total Protein 6.3 (*)    All other components within normal limits  CBC WITH DIFFERENTIAL/PLATELET - Abnormal; Notable for the following:    WBC 14.0 (*)    Neutro Abs 9.2 (*)    All other components within normal limits  URINE  MICROSCOPIC-ADD ON - Abnormal; Notable for the following:    Squamous Epithelial / LPF MANY (*)    Bacteria, UA MANY (*)    All other components within normal limits  WET PREP, GENITAL  PREGNANCY, URINE  HCG, QUANTITATIVE, PREGNANCY  GC/CHLAMYDIA PROBE AMP ([place]) NOT AT [facility]    Imaging Review No results found. I have personally reviewed and evaluated these images and lab results as part of my medical decision-making.   EKG Interpretation None      GC chlamydia cultures pending.   MDM   Final diagnoses:  Pelvic pain in female    Pt is well appearing.  Vitals stable.  Ambulates with a steady gait.  Mild to moderate tenderness of the right adnexa on pelvic exam, no CMT tenderness.  Clinical suspicion for acute abdomen, TOA or torsion is low.  Labs reviewed, elevated white ct, consistent with previous.   Pt is resting comfortably on the stretcher, non-toxic appearing.  Right pelvic pain  likely related to ovarian cyst.  I have scheduled her to return here at 7:45 am for pelvic US.  I have discussed patient findings and care plan with Dr. Elesa Massed.  Patient appears stable for d/c and agrees to plan.      Pauline Aus, PA-C 05/17/15 0204  Layla Maw Ward, DO 05/17/15 [phone]

## 2015-05-18 LAB — GC/CHLAMYDIA PROBE AMP (~~LOC~~) NOT AT ARMC
CHLAMYDIA, DNA PROBE: NEGATIVE
NEISSERIA GONORRHEA: NEGATIVE

## 2015-06-05 ENCOUNTER — Encounter (HOSPITAL_COMMUNITY): Payer: Self-pay | Admitting: Emergency Medicine

## 2015-06-05 ENCOUNTER — Emergency Department (HOSPITAL_COMMUNITY)
Admission: EM | Admit: 2015-06-05 | Discharge: 2015-06-05 | Disposition: A | Discharge status: Home / Self Care | Payer: Self-pay | Attending: Emergency Medicine | Admitting: Emergency Medicine

## 2015-06-05 ENCOUNTER — Emergency Department (HOSPITAL_COMMUNITY): Payer: Self-pay

## 2015-06-05 DIAGNOSIS — Z791 Long term (current) use of non-steroidal anti-inflammatories (NSAID): Secondary | ICD-10-CM | POA: Insufficient documentation

## 2015-06-05 DIAGNOSIS — F250 Schizoaffective disorder, bipolar type: Secondary | ICD-10-CM | POA: Insufficient documentation

## 2015-06-05 DIAGNOSIS — F419 Anxiety disorder, unspecified: Secondary | ICD-10-CM | POA: Insufficient documentation

## 2015-06-05 DIAGNOSIS — J40 Bronchitis, not specified as acute or chronic: Secondary | ICD-10-CM

## 2015-06-05 DIAGNOSIS — M791 Myalgia: Secondary | ICD-10-CM | POA: Insufficient documentation

## 2015-06-05 DIAGNOSIS — Z720 Tobacco use: Secondary | ICD-10-CM | POA: Insufficient documentation

## 2015-06-05 DIAGNOSIS — F329 Major depressive disorder, single episode, unspecified: Secondary | ICD-10-CM | POA: Insufficient documentation

## 2015-06-05 DIAGNOSIS — J209 Acute bronchitis, unspecified: Secondary | ICD-10-CM | POA: Insufficient documentation

## 2015-06-05 DIAGNOSIS — G629 Polyneuropathy, unspecified: Secondary | ICD-10-CM | POA: Insufficient documentation

## 2015-06-05 DIAGNOSIS — Z79899 Other long term (current) drug therapy: Secondary | ICD-10-CM | POA: Insufficient documentation

## 2015-06-05 DIAGNOSIS — Z792 Long term (current) use of antibiotics: Secondary | ICD-10-CM | POA: Insufficient documentation

## 2015-06-05 MED ORDER — ALBUTEROL SULFATE HFA 108 (90 BASE) MCG/ACT IN AERS
2.0000 | INHALATION_SPRAY | Freq: Once | RESPIRATORY_TRACT | Status: AC
  Administered 2015-06-05: 2 via RESPIRATORY_TRACT
  Filled 2015-06-05: qty 6.7

## 2015-06-05 MED ORDER — CIPROFLOXACIN HCL 500 MG PO TABS: 500.0000 mg | ORAL_TABLET | Freq: Two times a day (BID) | ORAL | Status: DC

## 2015-06-05 MED ORDER — CIPROFLOXACIN HCL 250 MG PO TABS
500.0000 mg | ORAL_TABLET | Freq: Once | ORAL | Status: AC
  Administered 2015-06-05: 500 mg via ORAL
  Filled 2015-06-05: qty 2

## 2015-06-05 MED ORDER — DEXAMETHASONE 4 MG PO TABS: 4.0000 mg | ORAL_TABLET | Freq: Two times a day (BID) | ORAL | Status: DC

## 2015-06-05 MED ORDER — PREDNISONE 10 MG PO TABS
60.0000 mg | ORAL_TABLET | Freq: Once | ORAL | Status: AC
  Administered 2015-06-05: 60 mg via ORAL
  Filled 2015-06-05 (×2): qty 1

## 2015-06-05 NOTE — ED Notes (Signed)
Patient complaining of cough x 5 days with body aches x 3 days. States she is coughing up "yellow phlegm."

## 2015-06-05 NOTE — ED Provider Notes (Signed)
CSN: [id number]     Arrival date & time 06/05/15  1138 History  By signing my name below, I, Heidi [name], attest that this documentation has been prepared under the direction and in the presence of [name], PA-C.  Electronically Signed: [name], ED Scribe. 06/05/2015. 12:46 PM.   Chief Complaint  Patient presents with  . Cough  . Generalized Body Aches   Patient is a 24 y.o. female presenting with cough. The history is provided by the patient. No language interpreter was used.  Cough Cough characteristics:  Productive Severity:  Moderate Onset quality:  Gradual Duration:  5 days Timing:  Intermittent Progression:  Unchanged Chronicity:  New Smoker: yes   Context: not exposure to allergens, not fumes, not occupational exposure and not sick contacts   Relieved by:  None tried Worsened by:  Nothing tried Ineffective treatments:  None tried Associated symptoms: myalgias   Associated symptoms: no fever and no rash   Risk factors: no chemical exposure and no recent travel     HPI Comments: Heidi [name] is a 24 y.o. female who presents to the Emergency Department complaining of intermittent, moderate, productive cough that started 5 days ago. Pt states generalized body aches as an associated symptom. She denies chemical exposure, recent travel, sick contacts or injuries to her chest or lungs. Pt smokes cigarettes. She denies fever, rash and hemoptysis.  Past Medical History  Diagnosis Date  . Neuropathy (HCC)     idopathic  . Anxiety   . Depression   . Schizo-affective schizophrenia [facility])    Past Surgical History  Procedure Laterality Date  . Dilation and curettage of uterus    . Cesarean section     History reviewed. No pertinent family history. Social History  Substance Use Topics  . Smoking status: Current Every Day Smoker -- 1.00 packs/day    Types: Cigarettes  . Smokeless tobacco: None  . Alcohol Use: Yes     Comment: occasionally    OB History     Gravida Para Term Preterm AB TAB SAB Ectopic Multiple Living            1     Review of Systems  Constitutional: Negative for fever.  Respiratory: Positive for cough.   Musculoskeletal: Positive for myalgias.  Skin: Negative for rash.  All other systems reviewed and are negative.  Allergies  Morphine and related  Home Medications   Prior to Admission medications   Medication Sig Start Date End Date Taking? Authorizing Provider  ALPRAZolam Prudy Feeler) 1 MG tablet Take 1 mg by mouth 4 (four) times daily.    Historical Provider, MD  ARIPiprazole (ABILIFY) 2 MG tablet Take 2 mg by mouth at bedtime.     Historical Provider, MD  cyclobenzaprine (FLEXERIL) 10 MG tablet Take 10 mg by mouth 3 (three) times daily as needed for muscle spasms.    Historical Provider, MD  gabapentin (NEURONTIN) 300 MG capsule Take 300 mg by mouth 3 (three) times daily.    Historical Provider, MD  HYDROcodone-acetaminophen (NORCO/VICODIN) 5-325 MG per tablet Take 1 tablet by mouth 3 (three) times daily as needed (pain).    Historical Provider, MD  meloxicam (MOBIC) 15 MG tablet Take 1 tablet (15 mg total) by mouth daily. 01/21/15   [name], DO  mupirocin cream (BACTROBAN) 2 % Apply 1 application topically 2 (two) times daily. 03/23/15   [name], PA-C  promethazine (PHENERGAN) 25 MG tablet Take 1 tablet (25 mg total) by mouth every  6 (six) hours as needed for nausea or vomiting. 03/23/15   [name], PA-C  sertraline (ZOLOFT) 100 MG tablet Take 100 mg by mouth daily.    Historical Provider, MD  traZODone (DESYREL) 50 MG tablet Take 50 mg by mouth at bedtime as needed for sleep.    Historical Provider, MD   BP 118/59 mmHg  Pulse 87  Temp(Src) 98.2 F (36.8 C) (Oral)  Resp 16  Ht  (1.702 m)  Wt 280 lb (127.007 kg)  BMI 43.84 kg/m2  SpO2 98%  LMP 06/04/2015 Physical Exam  Constitutional: She appears well-developed and well-nourished. No distress.  HENT:  Head: Normocephalic and atraumatic.  Airway  in patent Uvula mildly swollen No oral lesions Mild-to-moderate nasal congestion present  Eyes: Conjunctivae and EOM are normal. Pupils are equal, round, and reactive to light.  Neck: Neck supple. No tracheal deviation present.  Few palpable cervical lymph nodes present  Cardiovascular: Normal rate.   Pulmonary/Chest: Effort normal. No respiratory distress.  Symmetric rise and fall of the chest Scattered rhonchi, worse at the bases  Musculoskeletal: She exhibits no edema.  Negative Homan's  Skin: Skin is warm and dry.  Psychiatric: She has a normal mood and affect. Her behavior is normal.  Nursing note and vitals reviewed.  ED Course  Procedures   DIAGNOSTIC STUDIES: Oxygen Saturation is 98% on RA, normal by my interpretation.    COORDINATION OF CARE: 12:42 PM Discussed treatment plan with pt which includes discussion of x-ray results and smoking cessation. Will order an inhaler, steroid treatment and oral antibiotics. Pt agreed to plan.  Imaging Review Dg Chest 2 View  06/05/2015   CLINICAL DATA:  Cough, chest congestion, and mid chest pain for the past 5 days, current smoker  EXAM: CHEST  2 VIEW  COMPARISON:  PA and lateral chest x-ray of September 01, 2014  FINDINGS: The lungs are adequately inflated. There is minimal prominence of the interstitial markings bilaterally new since the previous study. There is no alveolar infiltrate or pleural effusion. The heart and pulmonary vascularity are normal. The mediastinum is normal in width. The bony thorax is unremarkable.  IMPRESSION: Mild interstitial prominence may reflect acute bronchitic change. There is no alveolar pneumonia. Follow-up radiographs are recommended if the patient's symptoms persist despite anticipated antibiotic therapy.   Electronically Signed   By: David  [place] M.D.   On: 06/05/2015 12:08   I have personally reviewed and evaluated these images as part of my medical decision-making.  MDM  Chest x-ray is consistent  with a bronchitis. Vital signs within normal limits. Pulse oximetry is 98% on room air. Within normal limits by my interpretation.  The patient will be asked to increase fluids. The patient will be treated with Decadron, albuterol, and Cipro. She will follow-up with her primary physician if not improving.    Final diagnoses:  None    *I have reviewed nursing notes, vital signs, and all appropriate lab and imaging results for this patient.**  **I personally performed the services described in this documentation, which was scribed in my presence. The recorded information has been reviewed and is accurate.[name], PA-C 06/05/15 1253  Mancel Bale, MD 06/05/15 [phone]

## 2015-06-05 NOTE — Discharge Instructions (Signed)
Your examination in your x-rays are consistent with bronchitis. Please use 2 puffs of albuterol every 4 hours. Please increase water, juices, and Gatorade soft. Please use Cipro and Decadron 2 times daily with food until all taken. Upper Respiratory Infection, Adult An upper respiratory infection (URI) is also sometimes known as the common cold. The upper respiratory tract includes the nose, sinuses, throat, trachea, and bronchi. Bronchi are the airways leading to the lungs. Most people improve within 1 week, but symptoms can last up to 2 weeks. A residual cough may last even longer.  CAUSES Many different viruses can infect the tissues lining the upper respiratory tract. The tissues become irritated and inflamed and often become very moist. Mucus production is also common. A cold is contagious. You can easily spread the virus to others by oral contact. This includes kissing, sharing a glass, coughing, or sneezing. Touching your mouth or nose and then touching a surface, which is then touched by another person, can also spread the virus. SYMPTOMS  Symptoms typically develop 1 to 3 days after you come in contact with a cold virus. Symptoms vary from person to person. They may include:  Runny nose.  Sneezing.  Nasal congestion.  Sinus irritation.  Sore throat.  Loss of voice (laryngitis).  Cough.  Fatigue.  Muscle aches.  Loss of appetite.  Headache.  Low-grade fever. DIAGNOSIS  You might diagnose your own cold based on familiar symptoms, since most people get a cold 2 to 3 times a year. Your caregiver can confirm this based on your exam. Most importantly, your caregiver can check that your symptoms are not due to another disease such as strep throat, sinusitis, pneumonia, asthma, or epiglottitis. Blood tests, throat tests, and X-rays are not necessary to diagnose a common cold, but they may sometimes be helpful in excluding other more serious diseases. Your caregiver will decide if any  further tests are required. RISKS AND COMPLICATIONS  You may be at risk for a more severe case of the common cold if you smoke cigarettes, have chronic heart disease (such as heart failure) or lung disease (such as asthma), or if you have a weakened immune system. The very young and very old are also at risk for more serious infections. Bacterial sinusitis, middle ear infections, and bacterial pneumonia can complicate the common cold. The common cold can worsen asthma and chronic obstructive pulmonary disease (COPD). Sometimes, these complications can require emergency medical care and may be life-threatening. PREVENTION  The best way to protect against getting a cold is to practice good hygiene. Avoid oral or hand contact with people with cold symptoms. Wash your hands often if contact occurs. There is no clear evidence that vitamin C, vitamin E, echinacea, or exercise reduces the chance of developing a cold. However, it is always recommended to get plenty of rest and practice good nutrition. TREATMENT  Treatment is directed at relieving symptoms. There is no cure. Antibiotics are not effective, because the infection is caused by a virus, not by bacteria. Treatment may include:  Increased fluid intake. Sports drinks offer valuable electrolytes, sugars, and fluids.  Breathing heated mist or steam (vaporizer or shower).  Eating chicken soup or other clear broths, and maintaining good nutrition.  Getting plenty of rest.  Using gargles or lozenges for comfort.  Controlling fevers with ibuprofen or acetaminophen as directed by your caregiver.  Increasing usage of your inhaler if you have asthma. Zinc gel and zinc lozenges, taken in the first 24 hours of the  common cold, can shorten the duration and lessen the severity of symptoms. Pain medicines may help with fever, muscle aches, and throat pain. A variety of non-prescription medicines are available to treat congestion and runny nose. Your caregiver  can make recommendations and may suggest nasal or lung inhalers for other symptoms.  HOME CARE INSTRUCTIONS   Only take over-the-counter or prescription medicines for pain, discomfort, or fever as directed by your caregiver.  Use a warm mist humidifier or inhale steam from a shower to increase air moisture. This may keep secretions moist and make it easier to breathe.  Drink enough water and fluids to keep your urine clear or pale yellow.  Rest as needed.  Return to work when your temperature has returned to normal or as your caregiver advises. You may need to stay home longer to avoid infecting others. You can also use a face mask and careful hand washing to prevent spread of the virus. SEEK MEDICAL CARE IF:   After the first few days, you feel you are getting worse rather than better.  You need your caregiver's advice about medicines to control symptoms.  You develop chills, worsening shortness of breath, or brown or red sputum. These may be signs of pneumonia.  You develop yellow or brown nasal discharge or pain in the face, especially when you bend forward. These may be signs of sinusitis.  You develop a fever, swollen neck glands, pain with swallowing, or white areas in the back of your throat. These may be signs of strep throat. SEEK IMMEDIATE MEDICAL CARE IF:   You have a fever.  You develop severe or persistent headache, ear pain, sinus pain, or chest pain.  You develop wheezing, a prolonged cough, cough up blood, or have a change in your usual mucus (if you have chronic lung disease).  You develop sore muscles or a stiff neck. Document Released: 02/12/2001 Document Revised: 11/11/2011 Document Reviewed: 11/24/2013 [facility] Patient Information 2015 [place], [place]. This information is not intended to replace advice given to you by your health care provider. Make sure you discuss any questions you have with your health care provider.

## 2015-06-22 ENCOUNTER — Ambulatory Visit (INDEPENDENT_AMBULATORY_CARE_PROVIDER_SITE_OTHER): Payer: Medicaid Other | Admitting: Family Medicine

## 2015-06-22 ENCOUNTER — Encounter: Payer: Self-pay | Admitting: Family Medicine

## 2015-06-22 VITALS — BP 139/94 | HR 94 | Temp 98.4°F | Ht 67.0 in | Wt 303.6 lb

## 2015-06-22 DIAGNOSIS — F419 Anxiety disorder, unspecified: Principal | ICD-10-CM | POA: Insufficient documentation

## 2015-06-22 DIAGNOSIS — K219 Gastro-esophageal reflux disease without esophagitis: Secondary | ICD-10-CM

## 2015-06-22 DIAGNOSIS — F32A Depression, unspecified: Secondary | ICD-10-CM

## 2015-06-22 DIAGNOSIS — F418 Other specified anxiety disorders: Secondary | ICD-10-CM

## 2015-06-22 DIAGNOSIS — F329 Major depressive disorder, single episode, unspecified: Secondary | ICD-10-CM

## 2015-06-22 MED ORDER — RANITIDINE HCL 150 MG PO TABS: 150.0000 mg | ORAL_TABLET | Freq: Two times a day (BID) | ORAL | Status: DC

## 2015-06-22 NOTE — Progress Notes (Signed)
BP 139/94 mmHg  Pulse 94  Temp(Src) 98.4 F (36.9 C) (Oral)  Ht 5\' 7"  (1.702 m)  Wt 303 lb 9.6 oz (137.712 kg)  BMI 47.54 kg/m2  LMP 06/04/2015   Subjective:    Patient ID: Heidi [name], female    DOB: 05/01/1991, 24 y.o.   MRN: [id number]  HPI: Heidi MyrtleJessica Hosmer is a 24 y.o. female presenting on 06/22/2015 for Abdominal Pain and Back Pain   HPI GERD She has epigastric abdominal pain over the past week that has been going through to her mid back. She does describe burping and belching symptoms but is often worse early in the morning she is not. She denies any vomiting or nausea or blood in her stool or dark tarry stools.  Anxiety and Depression She has been diagnosed with anxiety and depression and possibly bipolar versus schizoaffective disorder. She is currently on medications for anxiety and depression.   Relevant past medical, surgical, family and social history reviewed and updated as indicated. Interim medical history since our last visit reviewed. Allergies and medications reviewed and updated.  Review of Systems  Constitutional: Negative for fever and chills.  HENT: Negative for congestion, ear discharge and ear pain.   Eyes: Negative for redness and visual disturbance.  Respiratory: Negative for chest tightness and shortness of breath.   Cardiovascular: Negative for chest pain and leg swelling.  Gastrointestinal: Positive for nausea, abdominal pain and constipation. Negative for vomiting, diarrhea, blood in stool and abdominal distention.  Genitourinary: Negative for dysuria and difficulty urinating.  Musculoskeletal: Positive for back pain. Negative for gait problem.  Skin: Negative for rash.  Neurological: Negative for light-headedness and headaches.  Psychiatric/Behavioral: Positive for dysphoric mood and agitation. Negative for suicidal ideas, behavioral problems, confusion and sleep disturbance. The patient is nervous/anxious.   All other systems reviewed and are  negative.   Per HPI unless specifically indicated above  Social History   Social History  . Marital Status: Legally Separated    Spouse Name: N/A  . Number of Children: N/A  . Years of Education: N/A   Occupational History  . Not on file.   Social History Main Topics  . Smoking status: Current Every Day Smoker -- 1.00 packs/day    Types: Cigarettes  . Smokeless tobacco: Never Used  . Alcohol Use: Yes     Comment: occasionally   . Drug Use: Yes    Special: Marijuana     Comment: last use "1 month ago"  . Sexual Activity: Yes    Birth Control/ Protection: Surgical     Comment: boyfriend 1year   Other Topics Concern  . Not on file   Social History Narrative    Past Surgical History  Procedure Laterality Date  . Dilation and curettage of uterus    . Cesarean section      History reviewed. No pertinent family history.    Medication List       This list is accurate as of: 06/22/15  3:28 PM.  Always use your most recent med list.               cyclobenzaprine 10 MG tablet  Commonly known as:  FLEXERIL  Take 10 mg by mouth 3 (three) times daily as needed for muscle spasms.     gabapentin 300 MG capsule  Commonly known as:  NEURONTIN  Take 300 mg by mouth 3 (three) times daily.     HYDROcodone-acetaminophen 5-325 MG tablet  Commonly known as:  NORCO/VICODIN  Take 1 tablet by mouth 3 (three) times daily as needed (pain).     OLANZapine 5 MG tablet  Commonly known as:  ZYPREXA  Take 2.5 mg by mouth daily.     ranitidine 150 MG tablet  Commonly known as:  ZANTAC  Take 1 tablet (150 mg total) by mouth 2 (two) times daily.     sertraline 100 MG tablet  Commonly known as:  ZOLOFT  Take 100 mg by mouth daily.     traZODone 50 MG tablet  Commonly known as:  DESYREL  Take 50 mg by mouth at bedtime as needed for sleep.          Objective:    BP 139/94 mmHg  Pulse 94  Temp(Src) 98.4 F (36.9 C) (Oral)  Ht  (1.702 m)  Wt 303 lb 9.6 oz  (137.712 kg)  BMI 47.54 kg/m2  LMP 06/04/2015  Wt Readings from Last 3 Encounters:  06/22/15 303 lb 9.6 oz (137.712 kg)  06/05/15 280 lb (127.007 kg)  05/16/15 270 lb (122.471 kg)    Physical Exam  Constitutional: She is oriented to person, place, and time. She appears well-developed and well-nourished. No distress.  Eyes: Conjunctivae and EOM are normal. Pupils are equal, round, and reactive to light.  Cardiovascular: Normal rate and regular rhythm.   No murmur heard. Pulmonary/Chest: Effort normal and breath sounds normal. No respiratory distress. She has no wheezes.  Abdominal: Soft. Normal appearance and bowel sounds are normal. There is tenderness in the left upper quadrant. There is no rigidity, no rebound, no CVA tenderness and negative Murphy's sign.  Musculoskeletal: Normal range of motion. She exhibits no edema.       Thoracic back: She exhibits tenderness (right paraspinal tenderness). She exhibits normal range of motion, no bony tenderness, no swelling, no edema, no deformity and no laceration.  Neurological: She is alert and oriented to person, place, and time. Coordination normal.  Skin: Skin is warm and dry. No rash noted. She is not diaphoretic.  Psychiatric: She has a normal mood and affect. Her behavior is normal.  Vitals reviewed.   Results for orders placed or performed during the hospital encounter of 05/16/15  Wet prep, genital  Result Value Ref Range   Yeast Wet Prep HPF POC NONE SEEN NONE SEEN   Trich, Wet Prep NONE SEEN NONE SEEN   Clue Cells Wet Prep HPF POC FEW (A) NONE SEEN   WBC, Wet Prep HPF POC FEW (A) NONE SEEN  Urinalysis, Routine w reflex microscopic (not at [facility])  Result Value Ref Range   Color, Urine YELLOW YELLOW   APPearance CLEAR CLEAR   Specific Gravity, Urine 1.010 1.005 - 1.030   pH 6.5 5.0 - 8.0   Glucose, UA NEGATIVE NEGATIVE mg/dL   Hgb urine dipstick TRACE (A) NEGATIVE   Bilirubin Urine NEGATIVE NEGATIVE   Ketones, ur NEGATIVE  NEGATIVE mg/dL   Protein, ur NEGATIVE NEGATIVE mg/dL   Urobilinogen, UA 0.2 0.0 - 1.0 mg/dL   Nitrite NEGATIVE NEGATIVE   Leukocytes, UA NEGATIVE NEGATIVE  Pregnancy, urine  Result Value Ref Range   Preg Test, Ur NEGATIVE NEGATIVE  Comprehensive metabolic panel  Result Value Ref Range   Sodium 138 135 - 145 mmol/L   Potassium 3.4 (L) 3.5 - 5.1 mmol/L   Chloride 106 101 - 111 mmol/L   CO2 26 22 - 32 mmol/L   Glucose, Bld 112 (H) 65 - 99 mg/dL   BUN 9 6 - 20 mg/dL  Creatinine, Ser 0.72 0.44 - 1.00 mg/dL   Calcium 8.6 (L) 8.9 - 10.3 mg/dL   Total Protein 6.3 (L) 6.5 - 8.1 g/dL   Albumin 3.8 3.5 - 5.0 g/dL   AST 27 15 - 41 U/L   ALT 53 14 - 54 U/L   Alkaline Phosphatase 116 38 - 126 U/L   Total Bilirubin 0.3 0.3 - 1.2 mg/dL   GFR calc non Af Amer >60 >60 mL/min   GFR calc Af Amer >60 >60 mL/min   Anion gap 6 5 - 15  CBC with Differential  Result Value Ref Range   WBC 14.0 (H) 4.0 - 10.5 K/uL   RBC 4.37 3.87 - 5.11 MIL/uL   Hemoglobin 13.8 12.0 - 15.0 g/dL   HCT 40.9 81.1 - 91.4 %   MCV 90.6 78.0 - 100.0 fL   MCH 31.6 26.0 - 34.0 pg   MCHC 34.8 30.0 - 36.0 g/dL   RDW 78.2 95.6 - 21.3 %   Platelets 242 150 - 400 K/uL   Neutrophils Relative % 66 %   Neutro Abs 9.2 (H) 1.7 - 7.7 K/uL   Lymphocytes Relative 28 %   Lymphs Abs 3.9 0.7 - 4.0 K/uL   Monocytes Relative 4 %   Monocytes Absolute 0.6 0.1 - 1.0 K/uL   Eosinophils Relative 2 %   Eosinophils Absolute 0.3 0.0 - 0.7 K/uL   Basophils Relative 0 %   Basophils Absolute 0.0 0.0 - 0.1 K/uL  hCG, quantitative, pregnancy  Result Value Ref Range   hCG, Beta Chain, Quant, S <1 <5 mIU/mL  Urine microscopic-add on  Result Value Ref Range   Squamous Epithelial / LPF MANY (A) RARE   WBC, UA 0-2 <3 WBC/hpf   RBC / HPF 0-2 <3 RBC/hpf   Bacteria, UA MANY (A) RARE  GC/Chlamydia probe amp ([place])not at [facility]  Result Value Ref Range   Chlamydia Negative    Neisseria gonorrhea Negative       Assessment & Plan:   Problem  List Items Addressed This Visit      Other   Anxiety and depression - Primary    See psychiatry for anxiety and depression. She is also possibly been diagnosed with bipolar or schizoaffective disorder. We will wait for final diagnosis will get report from psych.       Other Visit Diagnoses    Gastroesophageal reflux disease without esophagitis        abd pain appears to be GERD radiating to back, we'll try Zantac.    Relevant Medications    ranitidine (ZANTAC) 150 MG tablet        Follow up plan: Return in about 4 weeks (around 07/20/2015), or if symptoms worsen or fail to improve, for [place], [employer].  Arville Care, MD [facility] 06/22/2015, 3:28 PM

## 2015-06-25 NOTE — Assessment & Plan Note (Addendum)
See psychiatry for anxiety and depression. She is also possibly been diagnosed with bipolar or schizoaffective disorder. We will wait for final diagnosis will get report from psych.

## 2015-07-21 ENCOUNTER — Ambulatory Visit (INDEPENDENT_AMBULATORY_CARE_PROVIDER_SITE_OTHER): Payer: Medicaid Other | Admitting: Family Medicine

## 2015-07-21 ENCOUNTER — Encounter: Payer: Self-pay | Admitting: Family Medicine

## 2015-07-21 VITALS — BP 129/73 | HR 78 | Temp 97.1°F | Ht 67.0 in | Wt 311.8 lb

## 2015-07-21 DIAGNOSIS — I152 Hypertension secondary to endocrine disorders: Secondary | ICD-10-CM | POA: Insufficient documentation

## 2015-07-21 DIAGNOSIS — I10 Essential (primary) hypertension: Secondary | ICD-10-CM

## 2015-07-21 DIAGNOSIS — Z13220 Encounter for screening for lipoid disorders: Secondary | ICD-10-CM | POA: Diagnosis not present

## 2015-07-21 DIAGNOSIS — Z131 Encounter for screening for diabetes mellitus: Secondary | ICD-10-CM | POA: Diagnosis not present

## 2015-07-21 DIAGNOSIS — E1159 Type 2 diabetes mellitus with other circulatory complications: Secondary | ICD-10-CM | POA: Insufficient documentation

## 2015-07-21 MED ORDER — HYDROCHLOROTHIAZIDE 12.5 MG PO CAPS: 12.5000 mg | ORAL_CAPSULE | Freq: Every day | ORAL | Status: DC

## 2015-07-21 NOTE — Progress Notes (Signed)
BP 129/73 mmHg  Pulse 78  Temp(Src) 97.1 F (36.2 C) (Oral)  Ht '5\' 7"'  (1.702 m)  Wt 311 lb 12.8 oz (141.432 kg)  BMI 48.82 kg/m2  LMP 07/04/2015   Subjective:    Patient ID: Heidi [name], female    DOB: [birth year], 24 y.o.   MRN: [id number]  HPI: Heidi [name] is a 24 y.o. female presenting on 07/21/2015 for Hypertension   HPI Hypertension Patient had elevated blood pressure she was here the last time and we are going to have her come back for a four-week recheck to see if still elevated. In the meantime she went to her OB/GYN doctor and they said that elevated there and saw that it was elevated with [place] and when ahead and started her on hydrochlorothiazide 12.5 mg daily. She has been on this for 2 days and her blood pressure today is 129/73. Patient denies headaches, blurred vision, chest pains, shortness of breath, or weakness. Denies any side effects from medication and is content with current medication.   Relevant past medical, surgical, family and social history reviewed and updated as indicated. Interim medical history since our last visit reviewed. Allergies and medications reviewed and updated.  Review of Systems  Constitutional: Negative for fever and chills.  HENT: Negative for congestion, ear discharge and ear pain.   Eyes: Negative for redness and visual disturbance.  Respiratory: Negative for chest tightness and shortness of breath.   Cardiovascular: Negative for chest pain and leg swelling.  Genitourinary: Negative for dysuria and difficulty urinating.  Musculoskeletal: Negative for back pain and gait problem.  Skin: Negative for rash.  Neurological: Negative for light-headedness and headaches.  Psychiatric/Behavioral: Negative for behavioral problems and agitation.  All other systems reviewed and are negative.   Per HPI unless specifically indicated above     Medication List       This list is accurate as of: 07/21/15 11:53 AM.  Always use your most  recent med list.               cyclobenzaprine 10 MG tablet  Commonly known as:  FLEXERIL  Take 10 mg by mouth 3 (three) times daily as needed for muscle spasms.     gabapentin 300 MG capsule  Commonly known as:  NEURONTIN  Take 300 mg by mouth 3 (three) times daily.     hydrochlorothiazide 12.5 MG capsule  Commonly known as:  MICROZIDE  Take 1 capsule (12.5 mg total) by mouth daily.     HYDROcodone-acetaminophen 5-325 MG tablet  Commonly known as:  NORCO/VICODIN  Take 1 tablet by mouth 3 (three) times daily as needed (pain).     ranitidine 150 MG tablet  Commonly known as:  ZANTAC  Take 1 tablet (150 mg total) by mouth 2 (two) times daily.     sertraline 100 MG tablet  Commonly known as:  ZOLOFT  Take 100 mg by mouth daily.     traZODone 50 MG tablet  Commonly known as:  DESYREL  Take 50 mg by mouth at bedtime as needed for sleep.           Objective:    BP 129/73 mmHg  Pulse 78  Temp(Src) 97.1 F (36.2 C) (Oral)  Ht '5\' 7"'  (1.702 m)  Wt 311 lb 12.8 oz (141.432 kg)  BMI 48.82 kg/m2  LMP 07/04/2015  Wt Readings from Last 3 Encounters:  07/21/15 311 lb 12.8 oz (141.432 kg)  06/22/15 303 lb 9.6 oz (137.712 kg)  06/05/15  280 lb (127.007 kg)    Physical Exam  Constitutional: She is oriented to person, place, and time. She appears well-developed and well-nourished. No distress.  Eyes: Conjunctivae and EOM are normal. Pupils are equal, round, and reactive to light.  Cardiovascular: Normal rate, regular rhythm, normal heart sounds and intact distal pulses.   No murmur heard. Pulmonary/Chest: Effort normal and breath sounds normal. No respiratory distress. She has no wheezes.  Musculoskeletal: Normal range of motion. She exhibits no edema or tenderness.  Neurological: She is alert and oriented to person, place, and time. Coordination normal.  Skin: Skin is warm and dry. No rash noted. She is not diaphoretic.  Psychiatric: She has a normal mood and affect. Her  behavior is normal.  Nursing note and vitals reviewed.   Results for orders placed or performed during the hospital encounter of 05/16/15  Wet prep, genital  Result Value Ref Range   Yeast Wet Prep HPF POC NONE SEEN NONE SEEN   Trich, Wet Prep NONE SEEN NONE SEEN   Clue Cells Wet Prep HPF POC FEW (A) NONE SEEN   WBC, Wet Prep HPF POC FEW (A) NONE SEEN  Urinalysis, Routine w reflex microscopic (not at [facility])  Result Value Ref Range   Color, Urine YELLOW YELLOW   APPearance CLEAR CLEAR   Specific Gravity, Urine 1.010 1.005 - 1.030   pH 6.5 5.0 - 8.0   Glucose, UA NEGATIVE NEGATIVE mg/dL   Hgb urine dipstick TRACE (A) NEGATIVE   Bilirubin Urine NEGATIVE NEGATIVE   Ketones, ur NEGATIVE NEGATIVE mg/dL   Protein, ur NEGATIVE NEGATIVE mg/dL   Urobilinogen, UA 0.2 0.0 - 1.0 mg/dL   Nitrite NEGATIVE NEGATIVE   Leukocytes, UA NEGATIVE NEGATIVE  Pregnancy, urine  Result Value Ref Range   Preg Test, Ur NEGATIVE NEGATIVE  Comprehensive metabolic panel  Result Value Ref Range   Sodium 138 135 - 145 mmol/L   Potassium 3.4 (L) 3.5 - 5.1 mmol/L   Chloride 106 101 - 111 mmol/L   CO2 26 22 - 32 mmol/L   Glucose, Bld 112 (H) 65 - 99 mg/dL   BUN 9 6 - 20 mg/dL   Creatinine, Ser 0.72 0.44 - 1.00 mg/dL   Calcium 8.6 (L) 8.9 - 10.3 mg/dL   Total Protein 6.3 (L) 6.5 - 8.1 g/dL   Albumin 3.8 3.5 - 5.0 g/dL   AST 27 15 - 41 U/L   ALT 53 14 - 54 U/L   Alkaline Phosphatase 116 38 - 126 U/L   Total Bilirubin 0.3 0.3 - 1.2 mg/dL   GFR calc non Af Amer >60 >60 mL/min   GFR calc Af Amer >60 >60 mL/min   Anion gap 6 5 - 15  CBC with Differential  Result Value Ref Range   WBC 14.0 (H) 4.0 - 10.5 K/uL   RBC 4.37 3.87 - 5.11 MIL/uL   Hemoglobin 13.8 12.0 - 15.0 g/dL   HCT 39.6 36.0 - 46.0 %   MCV 90.6 78.0 - 100.0 fL   MCH 31.6 26.0 - 34.0 pg   MCHC 34.8 30.0 - 36.0 g/dL   RDW 12.6 11.5 - 15.5 %   Platelets 242 150 - 400 K/uL   Neutrophils Relative % 66 %   Neutro Abs 9.2 (H) 1.7 - 7.7 K/uL    Lymphocytes Relative 28 %   Lymphs Abs 3.9 0.7 - 4.0 K/uL   Monocytes Relative 4 %   Monocytes Absolute 0.6 0.1 - 1.0 K/uL   Eosinophils  Relative 2 %   Eosinophils Absolute 0.3 0.0 - 0.7 K/uL   Basophils Relative 0 %   Basophils Absolute 0.0 0.0 - 0.1 K/uL  hCG, quantitative, pregnancy  Result Value Ref Range   hCG, Beta Chain, Quant, S <1 <5 mIU/mL  Urine microscopic-add on  Result Value Ref Range   Squamous Epithelial / LPF MANY (A) RARE   WBC, UA 0-2 <3 WBC/hpf   RBC / HPF 0-2 <3 RBC/hpf   Bacteria, UA MANY (A) RARE  GC/Chlamydia probe amp ([place])not at [facility]  Result Value Ref Range   Chlamydia Negative    Neisseria gonorrhea Negative       Assessment & Plan:   Problem List Items Addressed This Visit      Cardiovascular and Mediastinum   Essential hypertension, benign - Primary    patient has elevated blood pressure on 2 visits between here and her OB/GYN office and was started on hydrochlorothiazide. Continue hydrochlorothiazide.      Relevant Medications   hydrochlorothiazide (MICROZIDE) 12.5 MG capsule     Other   Diabetes mellitus screening   Relevant Orders   CMP14+EGFR   TSH   Lipid screening   Relevant Orders   Lipid panel       Follow up plan: Return in about 4 weeks (around 08/18/2015), or if symptoms worsen or fail to improve.  Counseling provided for all of the vaccine components Orders Placed This Encounter  Procedures  . CMP14+EGFR  . Lipid panel  . TSH    Caryl Pina, MD [facility] 07/21/2015, 11:53 AM

## 2015-07-24 NOTE — Assessment & Plan Note (Signed)
patient has elevated blood pressure on 2 visits between here and her OB/GYN office and was started on hydrochlorothiazide. Continue hydrochlorothiazide.

## 2015-08-11 ENCOUNTER — Telehealth: Payer: Self-pay | Admitting: Family Medicine

## 2015-08-11 NOTE — Telephone Encounter (Signed)
Patient states that she recently had an episode of what seemed to be extreme anxiety and she almost seems in a blur. She has a strong family history of seizures and after talking to her sisters she feels like she may be starting to have seizures as well based on what they are describing to her. Offered her an appt to come in but patient states that she already has an appt of 12-19 with [facility] and she will discuss it then. I advised patient that if episodes increase or worsen she needs to let us know right away so we can get her in for a visit or to seek help at the ER. Patient verbalized understanding

## 2015-08-21 ENCOUNTER — Ambulatory Visit: Payer: Self-pay | Admitting: Family Medicine

## 2015-09-26 ENCOUNTER — Other Ambulatory Visit: Payer: Self-pay

## 2015-09-26 DIAGNOSIS — Z131 Encounter for screening for diabetes mellitus: Secondary | ICD-10-CM

## 2015-09-26 DIAGNOSIS — Z13220 Encounter for screening for lipoid disorders: Secondary | ICD-10-CM

## 2015-09-26 DIAGNOSIS — I10 Essential (primary) hypertension: Secondary | ICD-10-CM

## 2015-09-26 DIAGNOSIS — F418 Other specified anxiety disorders: Secondary | ICD-10-CM

## 2015-09-26 NOTE — Progress Notes (Signed)
Lab only 

## 2015-09-27 ENCOUNTER — Telehealth: Payer: Self-pay | Admitting: Family Medicine

## 2015-09-27 LAB — CMP14+EGFR
ALBUMIN: 4.4 g/dL (ref 3.5–5.5)
ALK PHOS: 132 IU/L — AB (ref 39–117)
ALT: 32 IU/L (ref 0–32)
AST: 21 IU/L (ref 0–40)
Albumin/Globulin Ratio: 2.6 — ABNORMAL HIGH (ref 1.1–2.5)
BUN / CREAT RATIO: 9 (ref 8–20)
BUN: 7 mg/dL (ref 6–20)
Bilirubin Total: 0.4 mg/dL (ref 0.0–1.2)
CO2: 23 mmol/L (ref 18–29)
CREATININE: 0.74 mg/dL (ref 0.57–1.00)
Calcium: 9.1 mg/dL (ref 8.7–10.2)
Chloride: 100 mmol/L (ref 96–106)
GFR, EST AFRICAN AMERICAN: 129 mL/min/{1.73_m2} (ref >59)
GFR, EST NON AFRICAN AMERICAN: 112 mL/min/{1.73_m2} (ref >59)
GLOBULIN, TOTAL: 1.7 g/dL (ref 1.5–4.5)
Glucose: 69 mg/dL (ref 65–99)
Potassium: 4 mmol/L (ref 3.5–5.2)
SODIUM: 143 mmol/L (ref 134–144)
TOTAL PROTEIN: 6.1 g/dL (ref 6.0–8.5)

## 2015-09-27 LAB — LIPID PANEL
CHOL/HDL RATIO: 5.5 ratio — AB (ref 0.0–4.4)
Cholesterol, Total: 127 mg/dL (ref 100–199)
HDL: 23 mg/dL — AB (ref >39)
LDL CALC: 72 mg/dL (ref 0–99)
Triglycerides: 159 mg/dL — ABNORMAL HIGH (ref 0–149)
VLDL CHOLESTEROL CAL: 32 mg/dL (ref 5–40)

## 2015-09-27 LAB — TSH: TSH: 1.93 u[IU]/mL (ref 0.450–4.500)

## 2015-09-27 NOTE — Telephone Encounter (Signed)
Patient aware of results.

## 2015-09-29 ENCOUNTER — Ambulatory Visit: Payer: Self-pay | Admitting: Family Medicine

## 2015-10-02 ENCOUNTER — Encounter: Payer: Self-pay | Admitting: Family Medicine

## 2015-10-04 ENCOUNTER — Encounter: Payer: Self-pay | Admitting: Family Medicine

## 2015-10-04 ENCOUNTER — Ambulatory Visit (INDEPENDENT_AMBULATORY_CARE_PROVIDER_SITE_OTHER): Payer: Medicaid Other | Admitting: Family Medicine

## 2015-10-04 VITALS — BP 130/84 | HR 75 | Temp 99.7°F | Ht 67.0 in | Wt 309.2 lb

## 2015-10-04 DIAGNOSIS — I10 Essential (primary) hypertension: Secondary | ICD-10-CM

## 2015-10-04 DIAGNOSIS — M792 Neuralgia and neuritis, unspecified: Secondary | ICD-10-CM | POA: Diagnosis not present

## 2015-10-04 MED ORDER — HYDROCHLOROTHIAZIDE 12.5 MG PO CAPS: 12.5000 mg | ORAL_CAPSULE | Freq: Every day | ORAL | Status: DC

## 2015-10-04 MED ORDER — TRAMADOL HCL 50 MG PO TABS: 50.0000 mg | ORAL_TABLET | Freq: Three times a day (TID) | ORAL | Status: DC | PRN

## 2015-10-04 NOTE — Progress Notes (Signed)
BP 130/84 mmHg  Pulse 75  Temp(Src) 99.7 F (37.6 C) (Oral)  Ht '5\' 7"'  (1.702 m)  Wt 309 lb 3.2 oz (140.252 kg)  BMI 48.42 kg/m2  LMP 09/13/2015 (Approximate)   Subjective:    Patient ID: Heidi [name], female    DOB: 06-02-1991, 25 y.o.   MRN: [id number]  HPI: Caliegh Middlekauff is a 25 y.o. 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 She does not check it at that time because she does not have a machine that she does not know if it's upper down but it feels like what she felt previously when her blood pressure was high. She is currently taking hydrochlorothiazide 12.5 mg. I discussed that the symptoms of being high blood pressure or low blood pressure could both present similarly and because we don't know if it's high or low and it is normal here today in the office I don't know if we want to raise it at this point. She is going to find a drug store where she can go and check it so that she can no when those episodes happen if it is high or low. Patient denies headaches, chest pains, shortness of breath, or weakness. Denies any side effects from medication and is content with current medication.   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 She denies any sensory loss in her feet or weakness. She just gets a lot of burning and pain in her feet when she states standing for prolonged periods. The pain sometimes extends up the back of her legs and sometimes she gets burning in her back. The burning in her back is similar to what she had when she was pregnant. The pain is intermittent and is not present currently. Is mostly after being on her feet  for prolonged periods.   Relevant past medical, surgical, family and social history reviewed and updated as indicated. Interim medical history since our last visit reviewed. Allergies and medications reviewed and updated.  Review of Systems  Constitutional: Negative for fever and chills.  HENT: Negative for congestion, ear discharge and ear pain.   Eyes: Positive for visual disturbance. Negative for redness.  Respiratory: Negative for chest tightness and shortness of breath.   Cardiovascular: Negative for chest pain and leg swelling.  Genitourinary: Negative for dysuria and difficulty urinating.  Musculoskeletal: Positive for myalgias, back pain and arthralgias. Negative for joint swelling and gait problem.  Skin: Negative for rash.  Neurological: Positive for dizziness. Negative for light-headedness and headaches.  Psychiatric/Behavioral: Negative for behavioral problems and agitation.  All other systems reviewed and are negative.   Per HPI unless specifically indicated above     Medication List       This list is accurate as of: 10/04/15 11:59 PM.  Always use your most recent med list.               cyclobenzaprine 10 MG tablet  Commonly known as:  FLEXERIL  Take 10 mg by mouth 3 (three) times daily as needed for muscle spasms.     haloperidol 0.5 MG tablet  Commonly known as:  HALDOL  Take 0.5 mg by mouth daily.     hydrochlorothiazide 12.5 MG capsule  Commonly known as:  MICROZIDE  Take 1 capsule (12.5 mg total) by mouth daily.     ranitidine 150 MG tablet  Commonly known as:  ZANTAC  Take 1 tablet (150 mg total) by mouth 2 (two) times daily.     sertraline 100 MG tablet  Commonly known as:  ZOLOFT  Take 100 mg by mouth daily.     traMADol 50 MG tablet  Commonly known as:  ULTRAM  Take 1 tablet (50 mg total) by mouth every 8 (eight) hours as needed.     traZODone 50 MG tablet  Commonly known as:  DESYREL  Take 50 mg by mouth at bedtime as needed for sleep.             Objective:    BP 130/84 mmHg  Pulse 75  Temp(Src) 99.7 F (37.6 C) (Oral)  Ht '5\' 7"'  (1.702 m)  Wt 309 lb 3.2 oz (140.252 kg)  BMI 48.42 kg/m2  LMP 09/13/2015 (Approximate)  Wt Readings from Last 3 Encounters:  10/04/15 309 lb 3.2 oz (140.252 kg)  07/21/15 311 lb 12.8 oz (141.432 kg)  06/22/15 303 lb 9.6 oz (137.712 kg)    Physical Exam  Constitutional: She is oriented to person, place, and time. She appears well-developed and well-nourished. No distress.  Eyes: Conjunctivae and EOM are normal. Pupils are equal, round, and reactive to light.  Neck: Neck supple. No thyromegaly present.  Cardiovascular: Normal rate, regular rhythm, normal heart sounds and intact distal pulses.   No murmur heard. Pulmonary/Chest: Effort normal and breath sounds normal. No respiratory distress. She has no wheezes.  Musculoskeletal: Normal range of motion. She exhibits no edema or tenderness.  Lymphadenopathy:    She has no cervical adenopathy.  Neurological: She is alert and oriented to person, place, and time. She displays normal reflexes. No cranial nerve deficit. She exhibits normal muscle tone. Coordination normal.  Skin: Skin is warm and dry. No rash noted. She is not diaphoretic.  Psychiatric: She has a normal mood and affect. Her behavior is normal.  Nursing note and vitals reviewed.   Results for orders placed or performed in visit on 09/26/15  CMP14+EGFR  Result Value Ref Range   Glucose 69 65 - 99 mg/dL   BUN 7 6 - 20 mg/dL   Creatinine, Ser 0.74 0.57 - 1.00 mg/dL   GFR calc non Af Amer 112 >59 mL/min/1.73   GFR calc Af Amer 129 >59 mL/min/1.73   BUN/Creatinine Ratio 9 8 - 20   Sodium 143 134 - 144 mmol/L   Potassium 4.0 3.5 - 5.2 mmol/L   Chloride 100 96 - 106 mmol/L   CO2 23 18 - 29 mmol/L   Calcium 9.1 8.7 - 10.2 mg/dL   Total Protein 6.1 6.0 - 8.5 g/dL   Albumin 4.4 3.5 - 5.5 g/dL   Globulin, Total 1.7 1.5 - 4.5 g/dL   Albumin/Globulin Ratio 2.6 (H) 1.1 - 2.5    Bilirubin Total 0.4 0.0 - 1.2 mg/dL   Alkaline Phosphatase 132 (H) 39 - 117 IU/L   AST 21 0 - 40 IU/L   ALT 32 0 - 32 IU/L  TSH  Result Value Ref Range   TSH 1.930 0.450 - 4.500 uIU/mL  Lipid panel  Result Value Ref Range   Cholesterol, Total 127 100 - 199 mg/dL   Triglycerides 159 (H) 0 - 149 mg/dL   HDL 23 (  L) >39 mg/dL   VLDL Cholesterol Cal 32 5 - 40 mg/dL   LDL Calculated 72 0 - 99 mg/dL   Chol/HDL Ratio 5.5 (H) 0.0 - 4.4 ratio units      Assessment & Plan:   Problem List Items Addressed This Visit      Cardiovascular and Mediastinum   Essential hypertension, benign - Primary    Keep current dose of medications and check at home and see where it is during the dizzy episodes.      Relevant Medications   hydrochlorothiazide (MICROZIDE) 12.5 MG capsule    Other Visit Diagnoses    Neuropathic pain        Both legs extending the back, would like to try tramadol, recommended inserts for her feet to support her flat feet and abdominal exercises to help with posture        Follow up plan: Return if symptoms worsen or fail to improve.  Counseling provided for all of the vaccine components No orders of the defined types were placed in this encounter.    Caryl Pina, MD [facility] 10/05/2015, 8:33 AM

## 2015-10-05 NOTE — Assessment & Plan Note (Signed)
Keep current dose of medications and check at home and see where it is during the dizzy episodes.

## 2015-10-10 ENCOUNTER — Telehealth: Payer: Self-pay | Admitting: Family Medicine

## 2015-10-10 NOTE — Telephone Encounter (Signed)
Tell her to go ahead and double her hydrochlorothiazide to 25 mg. Continue to take home blood pressures so we can see how it is going.

## 2015-10-10 NOTE — Telephone Encounter (Signed)
Stp and she states when she was having blurry vision and headaches and her BP was 158/100, she took her HCTZ 12.5 this morning. After sitting for a few minutes her BP came down to 130/88. Please advise.

## 2015-10-10 NOTE — Telephone Encounter (Signed)
Pt aware to start taking 25 mg of the HCTZ and will monitor BPs at home.

## 2015-11-10 ENCOUNTER — Encounter: Payer: Self-pay | Admitting: Family Medicine

## 2015-11-10 ENCOUNTER — Ambulatory Visit (INDEPENDENT_AMBULATORY_CARE_PROVIDER_SITE_OTHER): Payer: Medicaid Other | Admitting: Family Medicine

## 2015-11-10 VITALS — BP 136/86 | HR 85 | Temp 98.5°F | Ht 67.0 in | Wt 304.4 lb

## 2015-11-10 DIAGNOSIS — I10 Essential (primary) hypertension: Secondary | ICD-10-CM

## 2015-11-10 DIAGNOSIS — N946 Dysmenorrhea, unspecified: Secondary | ICD-10-CM

## 2015-11-10 DIAGNOSIS — M792 Neuralgia and neuritis, unspecified: Secondary | ICD-10-CM | POA: Diagnosis not present

## 2015-11-10 LAB — PREGNANCY, URINE: PREG TEST UR: NEGATIVE

## 2015-11-10 MED ORDER — PREGABALIN 50 MG PO CAPS: 50.0000 mg | ORAL_CAPSULE | Freq: Three times a day (TID) | ORAL | Status: DC

## 2015-11-10 MED ORDER — LISINOPRIL 20 MG PO TABS: 20.0000 mg | ORAL_TABLET | Freq: Every day | ORAL | Status: DC

## 2015-11-10 NOTE — Assessment & Plan Note (Signed)
Likely associated with psychological disorders and fibromyalgia

## 2015-11-10 NOTE — Progress Notes (Signed)
BP 136/86 mmHg  Pulse 85  Temp(Src) 98.5 F (36.9 C) (Oral)  Ht  (1.702 m)  Wt 304 lb 6.4 oz (138.075 kg)  BMI 47.66 kg/m2  LMP 10/15/2015 (Approximate)   Subjective:    Patient ID: Heidi [name], female    DOB: August 17, 1991, 25 y.o.   MRN: [id number]  HPI: Heidi [name] is a 25 y.o. female presenting on 11/10/2015 for Annual Exam and Possible Pregnancy   HPI Hypertension recheck Patient is coming in today for hypertension recheck. She is currently on hydrochlorothiazide 12.5 mg daily. She has been taking her blood pressure at home and has been consistently getting a lot of numbers in the 145/90 range. Here in the office today is 136/86 but because of the home measurements patient does feel like she needs something a little bit more. She used to be on lisinopril before and liked that medication would like to go back on it. Patient denies headaches, blurred vision, chest pains, shortness of breath, or weakness. Denies any side effects from medication and is content with current medication.   Neuropathic pain Patient has been having neuropathic pain in both of her arms and both of her legs. She describes it as a tingling and burning sensation. She has had this for quite a few years and has been on different medications for her previously. She was on Lyrica at one time and felt like it helped greatly. We also discussed sending her for an neuropathy testing and she does have insurance now so she would like to go ahead and go do it. She denies any decreased sensation or back pain or neck pain more than what she has had muscular issues. She has not had any falls or trauma to her neck or back.  Missed menstrual period. She is 3 days late on her menstrual period she does have the Esure device but she was just concerned about possible ectopic because she has not had her period recently. She does not like that Esure and would like to get it removed because it does sometimes cause her to have  irregular and heavy periods and abdominal pain. She denies any current lower abdominal pain.  Relevant past medical, surgical, family and social history reviewed and updated as indicated. Interim medical history since our last visit reviewed. Allergies and medications reviewed and updated.  Review of Systems  Constitutional: Negative for fever and chills.  HENT: Negative for congestion, ear discharge and ear pain.   Eyes: Negative for redness and visual disturbance.  Respiratory: Negative for chest tightness and shortness of breath.   Cardiovascular: Negative for chest pain and leg swelling.  Gastrointestinal: Negative for abdominal pain, diarrhea, constipation, blood in stool and anal bleeding.  Genitourinary: Positive for menstrual problem. Negative for dysuria and difficulty urinating.  Musculoskeletal: Negative for back pain and gait problem.  Skin: Negative for rash.  Neurological: Positive for numbness (Tingling and burning and numbness in all 4 extremities). Negative for dizziness, tremors, facial asymmetry, speech difficulty, weakness, light-headedness and headaches.  Psychiatric/Behavioral: Negative for behavioral problems and agitation.  All other systems reviewed and are negative.   Per HPI unless specifically indicated above     Medication List       This list is accurate as of: 11/10/15 10:13 AM.  Always use your most recent med list.               haloperidol 0.5 MG tablet  Commonly known as:  HALDOL  Take 0.5 mg by  mouth daily.     hydrochlorothiazide 12.5 MG capsule  Commonly known as:  MICROZIDE  Take 1 capsule (12.5 mg total) by mouth daily.     lisinopril 20 MG tablet  Commonly known as:  PRINIVIL,ZESTRIL  Take 1 tablet (20 mg total) by mouth daily.     pregabalin 50 MG capsule  Commonly known as:  LYRICA  Take 1 capsule (50 mg total) by mouth 3 (three) times daily.     ranitidine 150 MG tablet  Commonly known as:  ZANTAC  Take 1 tablet (150 mg  total) by mouth 2 (two) times daily.     sertraline 100 MG tablet  Commonly known as:  ZOLOFT  Take 100 mg by mouth daily.     traMADol 50 MG tablet  Commonly known as:  ULTRAM  Take 1 tablet (50 mg total) by mouth every 8 (eight) hours as needed.     traZODone 50 MG tablet  Commonly known as:  DESYREL  Take 50 mg by mouth at bedtime as needed for sleep.           Objective:    BP 136/86 mmHg  Pulse 85  Temp(Src) 98.5 F (36.9 C) (Oral)  Ht 5\' 7"  (1.702 m)  Wt 304 lb 6.4 oz (138.075 kg)  BMI 47.66 kg/m2  LMP 10/15/2015 (Approximate)  Wt Readings from Last 3 Encounters:  11/10/15 304 lb 6.4 oz (138.075 kg)  10/04/15 309 lb 3.2 oz (140.252 kg)  07/21/15 311 lb 12.8 oz (141.432 kg)    Physical Exam  Constitutional: She is oriented to person, place, and time. She appears well-developed and well-nourished. No distress.  Eyes: Conjunctivae and EOM are normal. Pupils are equal, round, and reactive to light.  Neck: Neck supple. No thyromegaly present.  Cardiovascular: Normal rate, regular rhythm, normal heart sounds and intact distal pulses.   No murmur heard. Pulmonary/Chest: Effort normal and breath sounds normal. No respiratory distress. She has no wheezes.  Abdominal: Soft. Bowel sounds are normal. She exhibits no distension. There is no tenderness. There is no rebound and no guarding.  Musculoskeletal: Normal range of motion. She exhibits no edema or tenderness.  Lymphadenopathy:    She has no cervical adenopathy.  Neurological: She is alert and oriented to person, place, and time. She has normal strength and normal reflexes. She displays no tremor and normal reflexes. A sensory deficit (Subjective sensory deficit that is partial in both hands and feet, equal bilaterally) is present. No cranial nerve deficit. She exhibits normal muscle tone. She displays no seizure activity. Coordination normal.  Skin: Skin is warm and dry. No rash noted. She is not diaphoretic.    Psychiatric: She has a normal mood and affect. Her behavior is normal.  Nursing note and vitals reviewed.   Results for orders placed or performed in visit on 11/10/15  Pregnancy, urine  Result Value Ref Range   Preg Test, Ur Negative Negative      Assessment & Plan:   Problem List Items Addressed This Visit      Cardiovascular and Mediastinum   Essential hypertension, benign   Relevant Medications   lisinopril (PRINIVIL,ZESTRIL) 20 MG tablet     Other   Neuropathic pain    Likely associated with psychological disorders and fibromyalgia      Relevant Medications   pregabalin (LYRICA) 50 MG capsule   Other Relevant Orders   Ambulatory referral to Neurology    Other Visit Diagnoses    Dysmenorrhea    -  Primary    Urine pregnancy negative, does not like a sure and would like to see about getting it removed if insurance will pay for it    Relevant Orders    Pregnancy, urine (Completed)        Follow up plan: Return in about 3 months (around 02/10/2016), or if symptoms worsen or fail to improve, for Recheck blood pressure and Lyrica.  Counseling provided for all of the vaccine components Orders Placed This Encounter  Procedures  . Pregnancy, urine  . Ambulatory referral to Neurology    Arville Care, MD [facility] 11/10/2015, 10:13 AM

## 2015-11-13 LAB — HM DIABETES EYE EXAM

## 2015-11-16 ENCOUNTER — Ambulatory Visit: Payer: Medicaid Other | Admitting: Neurology

## 2015-11-16 ENCOUNTER — Telehealth: Payer: Self-pay | Admitting: *Deleted

## 2015-11-16 NOTE — Telephone Encounter (Addendum)
Patient called the answering service at 8:02am to cancel her 8am appt.

## 2015-11-22 ENCOUNTER — Ambulatory Visit: Payer: Medicaid Other | Admitting: Neurology

## 2015-11-29 ENCOUNTER — Ambulatory Visit (INDEPENDENT_AMBULATORY_CARE_PROVIDER_SITE_OTHER): Payer: Medicaid Other | Admitting: Neurology

## 2015-11-29 ENCOUNTER — Other Ambulatory Visit: Payer: Self-pay | Admitting: Neurology

## 2015-11-29 ENCOUNTER — Encounter: Payer: Self-pay | Admitting: Neurology

## 2015-11-29 VITALS — BP 132/82 | HR 78 | Ht 67.0 in | Wt 302.0 lb

## 2015-11-29 DIAGNOSIS — M792 Neuralgia and neuritis, unspecified: Secondary | ICD-10-CM | POA: Diagnosis not present

## 2015-11-29 NOTE — Progress Notes (Signed)
PATIENT: Heidi [name] DOB: [birth year]  Chief Complaint  Patient presents with  . Pain    She is here with her boyfriend, Baird Lyons.  She has been having burning pain, numbness, tingling in her feet, legs and more recently in her hands and arms (right side worse than left).  Her feet and legs started in 2015.  Her hands and arms started in 2016.  She also has an intermittent, sharp pain in her scapular regions.     HISTORICAL  Heidi [name] is a 25 year old right-handed female, accompanied by her boyfriend Baird Lyons, seen in refer by her primary care physician  Dr Elige Radon Dettinger in November 29 2015 for evaluation of numbness  She had past medical history of hypertension, depression anxiety  She started to notice bilateral plantar feet paresthesia since December 2014, initial symptoms or burning sensation at bilateral plantar feet, as if she steps on sharp object, it gradually spreading to the top of her feet, also intermittently involving posterior thigh with associated low back pain. She was getting a diagnosis of peripheral neuropathy, lumbosacral radiculopathy by [facility] neurologists, per patient, there was no test was performed, she was treated with Norco 10/325 mg 3 times a day, gabapentin up to 600 mg 3 times a day, she complains of lightheadedness with the medication.  She lost insurance from 2015 till recently, over the past 2 years, she had increased bilateral lower extremity paresthesia, also began to involving bilateral upper extremity, especially since September 2016, she complains of bilateral wrist pain, right hand paresthesia, wrist pain with deep palpitation, she also has intermittent left wrist pain, she has worsening low back pain, left upper back tender spots,   She denies gait difficulty,No bowel and bladder incontinence, she has mild neck pain, radiating pain to bilateral shoulder.  In addition, she complains long-standing history of intermittent headache, usually  preceded by visual distortion, followed by left side severe pounding headache was associated light noise sensitivity, nauseous, lasting for couple hours, relieved by lying down resting for a while,  She was started on Lyrica 50 mg 3 times a day, tramadol 50 mg 3 times a day since February 2017, which does help her symptoms,,  We reviewed laboratory evaluation January 2017, normal CMP, CBC, TSH  REVIEW OF SYSTEMS: Full 14 system review of systems performed and notable only for: Fatigue, palpitation, shortness of breath, cough, increased thirst, joint pain, cramps, allergy, memory loss, headache, numbness, weakness, dizziness, insomnia, sleepiness, depression, anxiety, decreased energy.  ALLERGIES: Allergies  Allergen Reactions  . Lorazepam     Sedation  . Morphine And Related Other (See Comments)    Makes pt.aggressive  . Quetiapine     Sweating, irritability    HOME MEDICATIONS: Current Outpatient Prescriptions  Medication Sig Dispense Refill  . haloperidol (HALDOL) 0.5 MG tablet Take 0.5 mg by mouth daily.    Marland Kitchen lisinopril (PRINIVIL,ZESTRIL) 20 MG tablet Take 1 tablet (20 mg total) by mouth daily. 90 tablet 3  . pregabalin (LYRICA) 50 MG capsule Take 1 capsule (50 mg total) by mouth 3 (three) times daily. 90 capsule 2  . ranitidine (ZANTAC) 150 MG tablet Take 1 tablet (150 mg total) by mouth 2 (two) times daily. 60 tablet 1  . sertraline (ZOLOFT) 100 MG tablet Take 100 mg by mouth daily.    . traMADol (ULTRAM) 50 MG tablet Take 1 tablet (50 mg total) by mouth every 8 (eight) hours as needed. 30 tablet 1  . traZODone (DESYREL) 50 MG tablet Take  50 mg by mouth at bedtime as needed for sleep.     No current facility-administered medications for this visit.    PAST MEDICAL HISTORY: Past Medical History  Diagnosis Date  . Neuropathy (HCC)     idopathic  . Anxiety   . Depression   . Schizo-affective schizophrenia (HCC)   . Back pain     PAST SURGICAL HISTORY: Past Surgical  History  Procedure Laterality Date  . Dilation and curettage of uterus    . Cesarean section      FAMILY HISTORY: Family History  Problem Relation Age of Onset  . Depression Mother   . Hypertension Mother   . Diabetes Mother   . Mental illness Father   . Hypertension Father   . Diabetes Father   . Cancer Maternal Aunt     breast cancer  . Diabetes Paternal Grandmother   . Hypertension Paternal Grandmother   . Arthritis Paternal Grandmother   . Stroke Paternal Grandfather   . Heart disease Paternal Grandfather   . Hypertension Paternal Grandfather   . Diabetes Paternal Grandfather     SOCIAL HISTORY:  Social History   Social History  . Marital Status: Legally Separated    Spouse Name: N/A  . Number of Children: 1  . Years of Education: HS   Occupational History  . Unemployed    Social History Main Topics  . Smoking status: Current Every Day Smoker -- 1.00 packs/day for 10 years    Types: Cigarettes  . Smokeless tobacco: Never Used  . Alcohol Use: 0.0 oz/week    0 Standard drinks or equivalent per week     Comment: Occasionally   . Drug Use: Yes    Special: Marijuana     Comment: Occasionally  . Sexual Activity: Yes    Birth Control/ Protection: Surgical     Comment: boyfriend 1year   Other Topics Concern  . Not on file   Social History Narrative   Lives at home with her boyfriend.   Right-handed.   Drinks approximately two 2-liter sodas per day.   Drinks 1 cup coffee per day.        PHYSICAL EXAM   Filed Vitals:   11/29/15 0959  BP: 132/82  Pulse: 78  Height: 5\' 7"  (1.702 m)  Weight: 302 lb (136.986 kg)    Not recorded      Body mass index is 47.29 kg/(m^2).  PHYSICAL EXAMNIATION:  Gen: NAD, conversant, well nourised, obese, well groomed                     Cardiovascular: Regular rate rhythm, no peripheral edema, warm, nontender. Eyes: Conjunctivae clear without exudates or hemorrhage Neck: Supple, no carotid bruise. Pulmonary:  Clear to auscultation bilaterally   NEUROLOGICAL EXAM:  MENTAL STATUS: Speech:    Speech is normal; fluent and spontaneous with normal comprehension.  Cognition:     Orientation to time, place and person     Normal recent and remote memory     Normal Attention span and concentration     Normal Language, naming, repeating,spontaneous speech     Fund of knowledge   CRANIAL NERVES: CN II: Visual fields are full to confrontation. Fundoscopic exam is normal with sharp discs and no vascular changes. Pupils are round equal and briskly reactive to light. CN III, IV, VI: extraocular movement are normal. No ptosis. CN V: Facial sensation is intact to pinprick in all 3 divisions bilaterally. Corneal responses are intact.  CN VII: Face is symmetric with normal eye closure and smile. CN VIII: Hearing is normal to rubbing fingers CN IX, X: Palate elevates symmetrically. Phonation is normal. CN XI: Head turning and shoulder shrug are intact CN XII: Tongue is midline with normal movements and no atrophy.  MOTOR: There is no pronator drift of out-stretched arms. Muscle bulk and tone are normal. Muscle strength is normal.  REFLEXES: Reflexes are 2+ and symmetric at the biceps, triceps, knees, and ankles. Plantar responses are flexor.  SENSORY: Mildly length dependent decreased to light touch, pinprick to ankle level, well-preserved positional sensation and vibration sensation at fingers and toes  COORDINATION: Rapid alternating movements and fine finger movements are intact. There is no dysmetria on finger-to-nose and heel-knee-shin.    GAIT/STANCE: Posture is normal. Gait is steady with normal steps, base, arm swing, and turning. Heel and toe walking are normal. Tandem gait is normal.  Romberg is absent.   DIAGNOSTIC DATA (LABS, IMAGING, TESTING) - I reviewed patient records, labs, notes, testing and imaging myself where available.   ASSESSMENT AND PLAN  Cayci Mcnabb is a 25 y.o.  female   Paresthesia  Differentiation diagnosis includes peripheral neuropathy  Laboratory evaluations  EMG nerve conduction study  Continue Lyrica 50 mg 3 times a day  Chronic migraine with aura  Ibuprofen, Tylenol as needed   Levert Feinstein, M.D. Ph.D.  [facility] [address] [place], [place] [address] Ph: [phone] Fax: [phone]  CC:  Nils Pyle, MD

## 2015-11-30 ENCOUNTER — Telehealth: Payer: Self-pay | Admitting: Neurology

## 2015-11-30 LAB — SEDIMENTATION RATE: Sed Rate: 7 mm/hr (ref 0–32)

## 2015-11-30 LAB — HGB A1C W/O EAG: HEMOGLOBIN A1C: 6 % — AB (ref 4.8–5.6)

## 2015-11-30 LAB — ANA W/REFLEX IF POSITIVE
Anti Nuclear Antibody(ANA): POSITIVE — AB
Centromere Ab Screen: <0.2 AI (ref 0.0–0.9)
Chromatin Ab SerPl-aCnc: <0.2 AI (ref 0.0–0.9)
ENA SM AB SER-ACNC: 0.4 AI (ref 0.0–0.9)
ENA SSB (LA) Ab: <0.2 AI (ref 0.0–0.9)
SCL 70: 3.8 AI — AB (ref 0.0–0.9)

## 2015-11-30 LAB — VITAMIN D 25 HYDROXY (VIT D DEFICIENCY, FRACTURES): Vit D, 25-Hydroxy: 22.3 ng/mL — ABNORMAL LOW (ref 30.0–100.0)

## 2015-11-30 LAB — HIV ANTIBODY (ROUTINE TESTING W REFLEX): HIV SCREEN 4TH GENERATION: NONREACTIVE

## 2015-11-30 LAB — VITAMIN B12: Vitamin B-12: 1158 pg/mL — ABNORMAL HIGH (ref 211–946)

## 2015-11-30 LAB — RPR: RPR Ser Ql: NONREACTIVE

## 2015-11-30 LAB — FOLATE: FOLATE: 11.8 ng/mL (ref >3.0)

## 2015-11-30 LAB — C-REACTIVE PROTEIN: CRP: 13.8 mg/L — AB (ref 0.0–4.9)

## 2015-11-30 NOTE — Telephone Encounter (Signed)
Per Dr Terrace ArabiaYan, spoke with patient and informed her A1c is 6.0, elevated over past few months. Suggested she have PCP keep watch on A1c. Informed her that Vit D is mildly low, to buy OTC Vit D3, take 1000 units daily. Other labs are normal. Reminded her of EMG/NCS on 12/18/15. She verbalized understanding, agreement.

## 2015-11-30 NOTE — Telephone Encounter (Signed)
Please call patient, laboratory evaluation showed elevated A1c 6.0, indicating elevated glucose over the past few months, mildly low vitamin D 22, normal should be 30 and above, she should take over-the-counter vitamin D3 supplement 1000 units daily,   Rest of the labs were normal. Keep EMG/NCS appt in April 17.

## 2015-12-04 ENCOUNTER — Encounter (HOSPITAL_COMMUNITY): Payer: Self-pay | Admitting: Emergency Medicine

## 2015-12-04 ENCOUNTER — Emergency Department (HOSPITAL_COMMUNITY)
Admission: EM | Admit: 2015-12-04 | Discharge: 2015-12-04 | Disposition: A | Discharge status: Home / Self Care | Payer: Medicaid Other | Attending: Emergency Medicine | Admitting: Emergency Medicine

## 2015-12-04 DIAGNOSIS — R51 Headache: Secondary | ICD-10-CM | POA: Diagnosis not present

## 2015-12-04 DIAGNOSIS — R1030 Lower abdominal pain, unspecified: Secondary | ICD-10-CM | POA: Diagnosis not present

## 2015-12-04 DIAGNOSIS — R6883 Chills (without fever): Secondary | ICD-10-CM | POA: Insufficient documentation

## 2015-12-04 DIAGNOSIS — F251 Schizoaffective disorder, depressive type: Secondary | ICD-10-CM | POA: Diagnosis not present

## 2015-12-04 DIAGNOSIS — F17210 Nicotine dependence, cigarettes, uncomplicated: Secondary | ICD-10-CM | POA: Insufficient documentation

## 2015-12-04 DIAGNOSIS — Z79899 Other long term (current) drug therapy: Secondary | ICD-10-CM | POA: Diagnosis not present

## 2015-12-04 DIAGNOSIS — R109 Unspecified abdominal pain: Secondary | ICD-10-CM

## 2015-12-04 LAB — CBC WITH DIFFERENTIAL/PLATELET
BASOS ABS: 0 10*3/uL (ref 0.0–0.1)
Basophils Relative: 0 %
EOS PCT: 2 %
Eosinophils Absolute: 0.3 10*3/uL (ref 0.0–0.7)
HCT: 39.3 % (ref 36.0–46.0)
Hemoglobin: 13.4 g/dL (ref 12.0–15.0)
LYMPHS ABS: 4 10*3/uL (ref 0.7–4.0)
LYMPHS PCT: 30 %
MCH: 30.6 pg (ref 26.0–34.0)
MCHC: 34.1 g/dL (ref 30.0–36.0)
MCV: 89.7 fL (ref 78.0–100.0)
MONO ABS: 0.6 10*3/uL (ref 0.1–1.0)
MONOS PCT: 5 %
Neutro Abs: 8.1 10*3/uL — ABNORMAL HIGH (ref 1.7–7.7)
Neutrophils Relative %: 63 %
PLATELETS: 263 10*3/uL (ref 150–400)
RBC: 4.38 MIL/uL (ref 3.87–5.11)
RDW: 13 % (ref 11.5–15.5)
WBC: 13 10*3/uL — ABNORMAL HIGH (ref 4.0–10.5)

## 2015-12-04 LAB — BASIC METABOLIC PANEL
Anion gap: 6 (ref 5–15)
BUN: 8 mg/dL (ref 6–20)
CALCIUM: 8.6 mg/dL — AB (ref 8.9–10.3)
CO2: 27 mmol/L (ref 22–32)
Chloride: 105 mmol/L (ref 101–111)
Creatinine, Ser: 0.78 mg/dL (ref 0.44–1.00)
GFR calc Af Amer: >60 mL/min (ref >60)
GFR calc non Af Amer: >60 mL/min (ref >60)
GLUCOSE: 108 mg/dL — AB (ref 65–99)
Potassium: 3.8 mmol/L (ref 3.5–5.1)
Sodium: 138 mmol/L (ref 135–145)

## 2015-12-04 LAB — POC URINE PREG, ED: Preg Test, Ur: NEGATIVE

## 2015-12-04 LAB — URINALYSIS, ROUTINE W REFLEX MICROSCOPIC
Bilirubin Urine: NEGATIVE
GLUCOSE, UA: NEGATIVE mg/dL
Hgb urine dipstick: NEGATIVE
KETONES UR: NEGATIVE mg/dL
Leukocytes, UA: NEGATIVE
Nitrite: NEGATIVE
PH: 6 (ref 5.0–8.0)
Protein, ur: NEGATIVE mg/dL
SPECIFIC GRAVITY, URINE: 1.025 (ref 1.005–1.030)

## 2015-12-04 MED ORDER — IBUPROFEN 400 MG PO TABS: 800.0000 mg | ORAL_TABLET | Freq: Four times a day (QID) | ORAL | Status: DC | PRN

## 2015-12-04 MED ORDER — KETOROLAC TROMETHAMINE 60 MG/2ML IM SOLN
60.0000 mg | Freq: Once | INTRAMUSCULAR | Status: AC
  Administered 2015-12-04: 60 mg via INTRAMUSCULAR
  Filled 2015-12-04: qty 2

## 2015-12-04 NOTE — ED Notes (Signed)
Pt c/o cramping to her lower abd, upper thighs, back and buttocks. Pt states she started her cycle 4/1.

## 2015-12-04 NOTE — ED Provider Notes (Signed)
CSN: [id number]     Arrival date & time 12/04/15  2017 History  By signing my name below, I, [name], attest that this documentation has been prepared under the direction and in the presence of Marily MemosJason Ernan Runkles, MD. Electronically Signed: Randell PatientMarrissa [name], ED Scribe. 12/04/2015. 3:00 PM.   Chief Complaint  Patient presents with  . Abdominal Cramping   The history is provided by the patient. No language interpreter was used.   HPI Comments: Heidi [name] is a 25 y.o. female with PMHx of neuropathy and back pain who presents to the Emergency Department complaining of intermittent, cramping, lower abdominal pain onset 3 days ago, that radiates to her back, upper thigh, and buttocks intermittently. Patient reports that she missed her period last month and that her period started this month with worse cramps but a lighter blood flow than normal. She endorses HA, nausea, chills, and polyuria. She has rested and taken Tylenol without relief. She notes similar symptoms in the past but states current symptoms are worse in severity. She takes tramadol, sertraline, lisinopril, Haldol. Per patient, she notes that she has had coils inserted into her fallopian tubes for purposes of permanent birth control. Denies taking ibuprofen or other pain medications. Denies taking a pregnancy test. Denies vomiting, dysuria, rash, cough, fevers above 101.  Past Medical History  Diagnosis Date  . Neuropathy (HCC)     idopathic  . Anxiety   . Depression   . Schizo-affective schizophrenia (HCC)   . Back pain    Past Surgical History  Procedure Laterality Date  . Dilation and curettage of uterus    . Cesarean section     Family History  Problem Relation Age of Onset  . Depression Mother   . Hypertension Mother   . Diabetes Mother   . Mental illness Father   . Hypertension Father   . Diabetes Father   . Cancer Maternal Aunt     breast cancer  . Diabetes Paternal Grandmother   . Hypertension Paternal  Grandmother   . Arthritis Paternal Grandmother   . Stroke Paternal Grandfather   . Heart disease Paternal Grandfather   . Hypertension Paternal Grandfather   . Diabetes Paternal Grandfather    Social History  Substance Use Topics  . Smoking status: Current Every Day Smoker -- 1.00 packs/day for 10 years    Types: Cigarettes  . Smokeless tobacco: Never Used  . Alcohol Use: 0.0 oz/week    0 Standard drinks or equivalent per week     Comment: Occasionally    OB History    Gravida Para Term Preterm AB TAB SAB Ectopic Multiple Living            1     Review of Systems  Constitutional: Positive for chills. Negative for fever.  Respiratory: Negative for cough.   Gastrointestinal: Positive for nausea and abdominal pain. Negative for vomiting.  Endocrine: Positive for polyuria.  Genitourinary: Negative for dysuria.  Skin: Negative for rash.  Neurological: Positive for headaches.  All other systems reviewed and are negative.     Allergies  Lorazepam; Morphine and related; and Quetiapine  Home Medications   Prior to Admission medications   Medication Sig Start Date End Date Taking? Authorizing Provider  cholecalciferol (VITAMIN D) 1000 units tablet Take 1,000 Units by mouth daily.   Yes Historical Provider, MD  haloperidol (HALDOL) 0.5 MG tablet Take 0.5 mg by mouth at bedtime.    Yes Historical Provider, MD  lisinopril (PRINIVIL,ZESTRIL) 20 MG tablet  Take 1 tablet (20 mg total) by mouth daily. 11/10/15  Yes Elige Radon Dettinger, MD  pregabalin (LYRICA) 50 MG capsule Take 1 capsule (50 mg total) by mouth 3 (three) times daily. 11/10/15  Yes Elige Radon Dettinger, MD  sertraline (ZOLOFT) 100 MG tablet Take 100 mg by mouth at bedtime.    Yes Historical Provider, MD  traMADol (ULTRAM) 50 MG tablet Take 1 tablet (50 mg total) by mouth every 8 (eight) hours as needed. 10/04/15  Yes Elige Radon Dettinger, MD  traZODone (DESYREL) 50 MG tablet Take 50 mg by mouth at bedtime as needed for sleep.   Yes  Historical Provider, MD  ibuprofen (ADVIL,MOTRIN) 400 MG tablet Take 2 tablets (800 mg total) by mouth every 6 (six) hours as needed for cramping. 12/04/15   Barbara Cower Kla Bily, MD   BP 132/78 mmHg  Pulse 70  Temp(Src) 99.2 F (37.3 C) (Oral)  Resp 18  Ht  (1.702 m)  Wt 300 lb (136.079 kg)  BMI 46.98 kg/m2  SpO2 99%  LMP 12/02/2015 Physical Exam  Constitutional: She is oriented to person, place, and time. She appears well-developed and well-nourished. No distress.  HENT:  Head: Normocephalic and atraumatic.  Eyes: Conjunctivae and EOM are normal.  Neck: Neck supple. No tracheal deviation present.  Cardiovascular: Normal rate, regular rhythm and normal heart sounds.  Exam reveals no gallop and no friction rub.   No murmur heard. Pulmonary/Chest: Effort normal and breath sounds normal. No respiratory distress. She has no wheezes. She has no rales.  Musculoskeletal: Normal range of motion.  Bilateral paraspinal tenderness.  Neurological: She is alert and oriented to person, place, and time.  Skin: Skin is warm and dry.  Psychiatric: She has a normal mood and affect. Her behavior is normal.  Nursing note and vitals reviewed.   ED Course  Procedures   DIAGNOSTIC STUDIES: Oxygen Saturation is 99% on RA, normal by my interpretation.    COORDINATION OF CARE: 9:59 PM Will order labs and urinalysis. Discussed treatment plan with pt at bedside and pt agreed to plan.   Labs Review Labs Reviewed  CBC WITH DIFFERENTIAL/PLATELET - Abnormal; Notable for the following:    WBC 13.0 (*)    Neutro Abs 8.1 (*)    All other components within normal limits  BASIC METABOLIC PANEL - Abnormal; Notable for the following:    Glucose, Bld 108 (*)    Calcium 8.6 (*)    All other components within normal limits  URINALYSIS, ROUTINE W REFLEX MICROSCOPIC (NOT AT [facility] [place] [facility])  POC URINE PREG, ED    Imaging Review No results found. I have personally reviewed and evaluated these images and lab results as  part of my medical decision-making.   EKG Interpretation None      MDM   Final diagnoses:  Abdominal cramping    Abdominal cramping related to menstrual cycle, not pregnant, not UTI. No medications that are worrisome for myalgias. No fever or lab abnormalities to suggest myositis. Will dc on NSAIDs and gynecology follow up.   New Prescriptions: Discharge Medication List as of 12/04/2015 11:28 PM    START taking these medications   Details  ibuprofen (ADVIL,MOTRIN) 400 MG tablet Take 2 tablets (800 mg total) by mouth every 6 (six) hours as needed for cramping., Starting 12/04/2015, Until Discontinued, Print         I have personally and contemperaneously reviewed labs and imaging and used in my decision making as above.   A medical screening exam  was performed and I feel the patient has had an appropriate workup for their chief complaint at this time and likelihood of emergent condition existing is low. Their vital signs are stable. They have been counseled on decision, discharge, follow up and which symptoms necessitate immediate return to the emergency department.  They verbally stated understanding and agreement with plan and discharged in stable condition.    I personally performed the services described in this documentation, which was scribed in my presence. The recorded information has been reviewed and is accurate.    Marily Memos, MD 12/05/15 1500

## 2015-12-04 NOTE — ED Notes (Signed)
Pt alert & oriented x4, stable gait. Patient given discharge instructions, paperwork & prescription(s). Patient  instructed to stop at the registration desk to finish any additional paperwork. Patient verbalized understanding. Pt left department w/ no further questions. 

## 2015-12-18 ENCOUNTER — Telehealth: Payer: Self-pay | Admitting: Neurology

## 2015-12-18 ENCOUNTER — Telehealth: Payer: Self-pay | Admitting: *Deleted

## 2015-12-18 ENCOUNTER — Ambulatory Visit: Payer: Medicaid Other | Admitting: Family Medicine

## 2015-12-18 ENCOUNTER — Encounter: Payer: Medicaid Other | Admitting: Neurology

## 2015-12-18 NOTE — Telephone Encounter (Signed)
Patient called 15 minutes prior to her arrival time to state she had overslept and could not make her appointment.

## 2015-12-18 NOTE — Telephone Encounter (Signed)
I called her twice, laboratory evaluation showed positive ANA, elevated scleroderma  antibody, mild elevated C reactive protein, WBC, I have faxed the result to her primary care physician, she will contact her primary care physician for further evaluation,

## 2015-12-18 NOTE — Telephone Encounter (Signed)
Spoke to patient - she would like a call back from Dr. Terrace ArabiaYan concerning her ANA w/ reflex lab.  She can be reached on her mobile # [phone].

## 2015-12-18 NOTE — Telephone Encounter (Signed)
Pt called back about lab results. [phone].

## 2015-12-18 NOTE — Telephone Encounter (Signed)
Pt called said she just woke up, her alarm did not go off on her phone. Pt asked if there was another time she could come today, when I told her there was not she began to cry uncontrollably. A man took over the phone call after about 2-3 minutes of her sobbing and another appt was scheduled for 5/18. FYI

## 2015-12-18 NOTE — Telephone Encounter (Signed)
Pt would like a call about lab results. [phone]

## 2015-12-19 ENCOUNTER — Encounter: Payer: Self-pay | Admitting: Neurology

## 2015-12-20 ENCOUNTER — Encounter: Payer: Self-pay | Admitting: Family Medicine

## 2015-12-20 ENCOUNTER — Ambulatory Visit (INDEPENDENT_AMBULATORY_CARE_PROVIDER_SITE_OTHER): Payer: Medicaid Other | Admitting: Family Medicine

## 2015-12-20 VITALS — BP 132/81 | HR 73 | Temp 98.8°F | Ht 67.0 in | Wt 303.4 lb

## 2015-12-20 DIAGNOSIS — K5900 Constipation, unspecified: Secondary | ICD-10-CM | POA: Diagnosis not present

## 2015-12-20 DIAGNOSIS — R7303 Prediabetes: Secondary | ICD-10-CM

## 2015-12-20 DIAGNOSIS — H9202 Otalgia, left ear: Secondary | ICD-10-CM | POA: Diagnosis not present

## 2015-12-20 DIAGNOSIS — R768 Other specified abnormal immunological findings in serum: Secondary | ICD-10-CM

## 2015-12-20 DIAGNOSIS — R894 Abnormal immunological findings in specimens from other organs, systems and tissues: Secondary | ICD-10-CM

## 2015-12-20 NOTE — Assessment & Plan Note (Signed)
Will get an appointment with Heidi [name] for her

## 2015-12-20 NOTE — Progress Notes (Signed)
BP 132/81 mmHg  Pulse 73  Temp(Src) 98.8 F (37.1 C) (Oral)  Ht 5\' 7"  (1.702 m)  Wt 303 lb 6.4 oz (137.621 kg)  BMI 47.51 kg/m2  LMP 12/02/2015   Subjective:    Patient ID: Heidi [name], female    DOB: 09/20/1990, 25 y.o.   MRN: [id number]  HPI: Heidi MyrtleJessica Wixom is a 25 y.o. female presenting on 12/20/2015 for Discuss neurology visit; Constipation, abdominal pain, loose stools; and Left ear pain   HPI Prediabetes Patient is coming in today for a follow-up on her neurology visit where they did some extensive blood work. One of the testing that they did was a hemoglobin A1c which came back as 6.0. We discussed diet and exercise and the need for weight loss to help stave off diabetes at a young age. 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 She says IVS does run in her family and is concerned about that. Hers has been going on for the past couple months. She has used some Dulcolax intermittently and it usually helps. She denies any fevers or chills or blood in her stool or nausea or vomiting.  Left ear pain Patient gets sharp twinges in her left ear last a few seconds and has been happening intermittently for the past couple weeks. She denies any fevers or chills or drainage from the ear.  Relevant past medical, surgical, family and social history reviewed and updated as indicated. Interim medical history since our last visit reviewed. Allergies and medications reviewed and updated.  Review of Systems  Constitutional: Negative for fever and chills.  HENT: Positive for ear pain. Negative for congestion, ear discharge, sinus pressure, sneezing, sore throat and tinnitus.   Eyes: Negative for redness and visual disturbance.  Respiratory: Negative for cough, chest  tightness and shortness of breath.   Cardiovascular: Negative for chest pain and leg swelling.  Gastrointestinal: Positive for abdominal pain and constipation. Negative for nausea, vomiting, diarrhea, blood in stool, abdominal distention, anal bleeding and rectal pain.  Genitourinary: Negative for dysuria and difficulty urinating.  Musculoskeletal: Negative for back pain and gait problem.  Skin: Negative for rash.  Neurological: Negative for light-headedness and headaches.  Psychiatric/Behavioral: Negative for behavioral problems and agitation.  All other systems reviewed and are negative.   Per HPI unless specifically indicated above     Medication List       This list is accurate as of: 12/20/15  1:28 PM.  Always use your most recent med list.               cholecalciferol 1000 units tablet  Commonly known as:  VITAMIN D  Take 1,000 Units by mouth daily.     haloperidol 0.5 MG tablet  Commonly known as:  HALDOL  Take 0.5 mg by mouth at bedtime.     ibuprofen 400 MG tablet  Commonly known as:  ADVIL,MOTRIN  Take 2 tablets (800 mg total) by mouth every 6 (six) hours as needed for cramping.     lisinopril 20 MG tablet  Commonly known as:  PRINIVIL,ZESTRIL  Take 1 tablet (20 mg total) by mouth daily.     pregabalin 50 MG capsule  Commonly known as:  LYRICA  Take 1 capsule (50 mg total) by mouth 3 (three) times daily.  sertraline 100 MG tablet  Commonly known as:  ZOLOFT  Take 100 mg by mouth at bedtime.     traMADol 50 MG tablet  Commonly known as:  ULTRAM  Take 1 tablet (50 mg total) by mouth every 8 (eight) hours as needed.     traZODone 50 MG tablet  Commonly known as:  DESYREL  Take 50 mg by mouth at bedtime as needed for sleep.           Objective:    BP 132/81 mmHg  Pulse 73  Temp(Src) 98.8 F (37.1 C) (Oral)  Ht  (1.702 m)  Wt 303 lb 6.4 oz (137.621 kg)  BMI 47.51 kg/m2  LMP 12/02/2015  Wt Readings from Last 3 Encounters:  12/20/15  303 lb 6.4 oz (137.621 kg)  12/04/15 300 lb (136.079 kg)  11/29/15 302 lb (136.986 kg)    Physical Exam  Constitutional: She is oriented to person, place, and time. She appears well-developed and well-nourished. No distress.  HENT:  Right Ear: External ear normal.  Left Ear: External ear normal.  Nose: Nose normal.  Mouth/Throat: Oropharynx is clear and moist. No oropharyngeal exudate.  Eyes: Conjunctivae and EOM are normal. Pupils are equal, round, and reactive to light.  Neck: Neck supple. No thyromegaly present.  Cardiovascular: Normal rate, regular rhythm, normal heart sounds and intact distal pulses.   No murmur heard. Pulmonary/Chest: Effort normal and breath sounds normal. No respiratory distress. She has no wheezes.  Abdominal: Soft. Bowel sounds are normal. She exhibits no distension. There is tenderness (Mild diffuse). There is no rebound and no guarding.  Musculoskeletal: Normal range of motion. She exhibits no edema or tenderness.  Lymphadenopathy:    She has no cervical adenopathy.  Neurological: She is alert and oriented to person, place, and time. No cranial nerve deficit. Coordination normal.  Skin: Skin is warm and dry. No rash noted. She is not diaphoretic.  Psychiatric: She has a normal mood and affect. Her behavior is normal.  Nursing note and vitals reviewed.   Results for orders placed or performed during the hospital encounter of 12/04/15  CBC with Differential  Result Value Ref Range   WBC 13.0 (H) 4.0 - 10.5 K/uL   RBC 4.38 3.87 - 5.11 MIL/uL   Hemoglobin 13.4 12.0 - 15.0 g/dL   HCT 13.2 44.0 - 10.2 %   MCV 89.7 78.0 - 100.0 fL   MCH 30.6 26.0 - 34.0 pg   MCHC 34.1 30.0 - 36.0 g/dL   RDW 72.5 36.6 - 44.0 %   Platelets 263 150 - 400 K/uL   Neutrophils Relative % 63 %   Neutro Abs 8.1 (H) 1.7 - 7.7 K/uL   Lymphocytes Relative 30 %   Lymphs Abs 4.0 0.7 - 4.0 K/uL   Monocytes Relative 5 %   Monocytes Absolute 0.6 0.1 - 1.0 K/uL   Eosinophils Relative 2  %   Eosinophils Absolute 0.3 0.0 - 0.7 K/uL   Basophils Relative 0 %   Basophils Absolute 0.0 0.0 - 0.1 K/uL  Basic metabolic panel  Result Value Ref Range   Sodium 138 135 - 145 mmol/L   Potassium 3.8 3.5 - 5.1 mmol/L   Chloride 105 101 - 111 mmol/L   CO2 27 22 - 32 mmol/L   Glucose, Bld 108 (H) 65 - 99 mg/dL   BUN 8 6 - 20 mg/dL   Creatinine, Ser 3.47 0.44 - 1.00 mg/dL   Calcium 8.6 (L) 8.9 - 10.3 mg/dL  GFR calc non Af Amer >60 >60 mL/min   GFR calc Af Amer >60 >60 mL/min   Anion gap 6 5 - 15  Urinalysis, Routine w reflex microscopic (not at [facility])  Result Value Ref Range   Color, Urine YELLOW YELLOW   APPearance CLEAR CLEAR   Specific Gravity, Urine 1.025 1.005 - 1.030   pH 6.0 5.0 - 8.0   Glucose, UA NEGATIVE NEGATIVE mg/dL   Hgb urine dipstick NEGATIVE NEGATIVE   Bilirubin Urine NEGATIVE NEGATIVE   Ketones, ur NEGATIVE NEGATIVE mg/dL   Protein, ur NEGATIVE NEGATIVE mg/dL   Nitrite NEGATIVE NEGATIVE   Leukocytes, UA NEGATIVE NEGATIVE  POC urine preg, ED (not at [facility])  Result Value Ref Range   Preg Test, Ur NEGATIVE NEGATIVE      Assessment & Plan:   Problem List Items Addressed This Visit      Other   Prediabetes - Primary    Will get an appointment with Tammy for her       Other Visit Diagnoses    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    Constipation, unspecified constipation type        Uses MiraLAX to try and get the bowels moving and recent persistent, does not work return and we'll do further testing.        Follow up plan: Return in about 2 months (around 02/19/2016), or if symptoms worsen or fail to improve, for Recheck anxiety and depression.  Counseling provided for all of the vaccine components Orders Placed This Encounter  Procedures  . Ambulatory referral to Rheumatology    Arville Care, MD [facility] 12/20/2015, 1:28 PM

## 2016-01-08 ENCOUNTER — Ambulatory Visit: Payer: Medicaid Other | Admitting: Pharmacist

## 2016-01-10 ENCOUNTER — Encounter: Payer: Self-pay | Admitting: Adult Health

## 2016-01-10 ENCOUNTER — Ambulatory Visit (INDEPENDENT_AMBULATORY_CARE_PROVIDER_SITE_OTHER): Payer: Medicaid Other | Admitting: Adult Health

## 2016-01-10 VITALS — BP 114/80 | HR 76 | Ht 68.0 in | Wt 304.0 lb

## 2016-01-10 DIAGNOSIS — R1031 Right lower quadrant pain: Secondary | ICD-10-CM | POA: Diagnosis not present

## 2016-01-10 DIAGNOSIS — R140 Abdominal distension (gaseous): Secondary | ICD-10-CM | POA: Insufficient documentation

## 2016-01-10 DIAGNOSIS — R350 Frequency of micturition: Secondary | ICD-10-CM | POA: Insufficient documentation

## 2016-01-10 DIAGNOSIS — R319 Hematuria, unspecified: Secondary | ICD-10-CM | POA: Insufficient documentation

## 2016-01-10 DIAGNOSIS — M545 Low back pain, unspecified: Secondary | ICD-10-CM

## 2016-01-10 DIAGNOSIS — M549 Dorsalgia, unspecified: Secondary | ICD-10-CM | POA: Insufficient documentation

## 2016-01-10 DIAGNOSIS — R109 Unspecified abdominal pain: Secondary | ICD-10-CM | POA: Insufficient documentation

## 2016-01-10 HISTORY — DX: Abdominal distension (gaseous): R14.0

## 2016-01-10 HISTORY — DX: Unspecified abdominal pain: R10.9

## 2016-01-10 HISTORY — DX: Hematuria, unspecified: R31.9

## 2016-01-10 HISTORY — DX: Frequency of micturition: R35.0

## 2016-01-10 LAB — POCT URINALYSIS DIPSTICK
Glucose, UA: NEGATIVE
LEUKOCYTES UA: NEGATIVE
Nitrite, UA: NEGATIVE
Protein, UA: NEGATIVE

## 2016-01-10 NOTE — Patient Instructions (Signed)
Return in 1 week for GYN US Then see me in 2-3 days after that Will talk when urine and GC/CHL back

## 2016-01-10 NOTE — Progress Notes (Signed)
Subjective:     Patient ID: Heidi [name], female   DOB: Apr 21, 1991, 25 y.o.   MRN: [id number]  HPI Heidi [name] is a 25 year old white female, single, G2P1, new to this practice, in complaining of abdominal pain ,bloating, cramping in legs with her period, which is light and urinary frequency and pressure(which seems to be worse when relaxed, and dribbles some).She had the ESSURE placed in 2014, in [place], and she feels all this discomfort is related to it.She was seen in ER 12/04/15 for abdominal pain. PCP is [facility] and neurologist is Dr Ernst Breach she has appt in September with rheumatologist,had elevated ANA and ESR.And she sees chiropractor, already for back.   She says she has had trich and chlamydia in 2011. She wants ESSURE removed at some point.   Review of Systems Patient denies any headaches, hearing loss, fatigue, blurred vision, shortness of breath, chest pain,  problems with bowel movements, or intercourse. No joint pain or mood swings.See HPI for positives.  Reviewed past medical,surgical, social and family history. Reviewed medications and allergies.     Objective:   Physical Exam BP 114/80 mmHg  Pulse 76  Ht '5\' 8"'  (1.727 m)  Wt 304 lb (137.893 kg)  BMI 46.23 kg/m2  LMP 01/03/2016 Urine trace blood, Skin warm and dry, has tattoos.Has caries in front teeth.  Neck: mid line trachea, normal thyroid, good ROM, no lymphadenopathy noted. Lungs: clear to ausculation bilaterally. Cardiovascular: regular rate and rhythm.Abdomen is soft and obese and has tenderness RLQ area, no HSM noted.Has tenderness along spine and to the right near scapula. Pelvic: external genitalia is normal in appearance no lesions, vagina:good color, moisture and rugae,urethra has no lesions or masses noted, cervix:smooth and bulbous, no CMT, uterus: normal size, shape and contour, non tender, no masses felt, adnexa: no masses, RLQ tenderness noted. Bladder is non tender and no masses felt.  Discussed that  will get [place] to assess uterus and tubes  And will sent urine for UA C&S and GC/CHL.She is aware that I do not do surgery, but can refer to MD in practice to assess after [place] results back.    Assessment:      RLQ pain Urinary frequency Bloating Hematuria Back pain     Plan:     UA C&S sent GC/CHL sent on urine Return in 1 week for gyn [place] Then see me 2-3 days later to review

## 2016-01-11 LAB — URINALYSIS, ROUTINE W REFLEX MICROSCOPIC
Bilirubin, UA: NEGATIVE
GLUCOSE, UA: NEGATIVE
KETONES UA: NEGATIVE
Leukocytes, UA: NEGATIVE
NITRITE UA: NEGATIVE
Protein, UA: NEGATIVE
RBC, UA: NEGATIVE
SPEC GRAV UA: 1.011 (ref 1.005–1.030)
UUROB: 0.2 mg/dL (ref 0.2–1.0)
pH, UA: 7 (ref 5.0–7.5)

## 2016-01-12 LAB — GC/CHLAMYDIA PROBE AMP
Chlamydia trachomatis, NAA: NEGATIVE
Neisseria gonorrhoeae by PCR: NEGATIVE

## 2016-01-12 LAB — URINE CULTURE

## 2016-01-15 ENCOUNTER — Encounter: Payer: Self-pay | Admitting: Pharmacist

## 2016-01-15 ENCOUNTER — Ambulatory Visit (INDEPENDENT_AMBULATORY_CARE_PROVIDER_SITE_OTHER): Payer: Medicaid Other | Admitting: Pharmacist

## 2016-01-15 VITALS — BP 128/72 | HR 80 | Ht 67.0 in | Wt 302.0 lb

## 2016-01-15 DIAGNOSIS — R7303 Prediabetes: Secondary | ICD-10-CM | POA: Diagnosis not present

## 2016-01-15 NOTE — Progress Notes (Signed)
Subjective:    Heidi [name] is a 25 y.o. female who presents for an initial evaluation of prediabetes. She has a significant family history of DM - mother and paternal grandfather.   She also reports that she is currently being worked up and has testing scheduled for the next few weeks for neuropathy / nerve conduction study and abdominal pain / abd [place].   Cardiovascular risk factors: hypertension, obesity (BMI >= 30 kg/m2), sedentary lifestyle and smoking/ tobacco exposure  Outpatient Encounter Prescriptions as of 01/15/2016  Medication Sig  . cholecalciferol (VITAMIN D) 1000 units tablet Take 1,000 Units by mouth daily.  . cyclobenzaprine (FLEXERIL) 10 MG tablet Take 10 mg by mouth as needed for muscle spasms.  . haloperidol (HALDOL) 0.5 MG tablet Take 0.5 mg by mouth at bedtime.   Marland Kitchen lisinopril (PRINIVIL,ZESTRIL) 20 MG tablet Take 1 tablet (20 mg total) by mouth daily.  . pregabalin (LYRICA) 50 MG capsule Take 1 capsule (50 mg total) by mouth 3 (three) times daily.  . sertraline (ZOLOFT) 100 MG tablet Take 100 mg by mouth at bedtime.   . traMADol (ULTRAM) 50 MG tablet Take 1 tablet (50 mg total) by mouth every 8 (eight) hours as needed.  . traZODone (DESYREL) 50 MG tablet Take 50 mg by mouth at bedtime as needed for sleep.   No facility-administered encounter medications on file as of 01/15/2016.    Weight trend: stable Prior visit with dietician: no Current diet: in general, an "unhealthy" diet  Patient drinks lots of regular soda (I hate the taste of diet sodas and artificial sweetners), skips breakfast, first meal of day is around 1 or 2 pm; I eat "whatever is available" for lunch; dinner is usually a meat, vegetable and starch.  Past Weight loss attempt - self directed diet - used caloris counting app - lost about 10 lbs.  She reports that she does not like group classes and has never tried anything like Weight Watchers.   Current exercise: none - due to pain / nerve pain  Is She  on ACE inhibitor or angiotensin II receptor blocker?  Yes  lisinopril (Prinivil)    The following portions of the patient's history were reviewed and updated as appropriate: allergies, current medications, past family history, past medical history, past social history, past surgical history and problem list.  Objective:    LMP 01/03/2016  A1c = 6.00% (11/29/2015)   Lab Review GLUCOSE (mg/dL)  Date Value  16/06/9603 69   GLUCOSE, BLD (mg/dL)  Date Value  54/05/8118 108*  05/16/2015 112*  03/23/2015 125*   CO2 (mmol/L)  Date Value  12/04/2015 27  09/26/2015 23  05/16/2015 26   BUN (mg/dL)  Date Value  14/78/2956 8  09/26/2015 7  05/16/2015 9  03/23/2015 13   CREATININE, SER (mg/dL)  Date Value  21/30/8657 0.78  09/26/2015 0.74  05/16/2015 0.72    Assessment:   Pre diabetes.    Plan:    1.  Rx changes: discussed possibly trying metformin however until w/u of abdominal pain is complete I don't think it it best to start metformin    I also discussed pharmacotherapeutic options for weight loss.  Medicaid does not cover any approved weight loss agents.  Phentermine is one of the few that is not too expensive.  However, I would also weight to consider this until after her up coming testing.  2.  Education: Reviewed pre diabetes vs. Diabetes.   3. Discussed CHO counting diet and ways to  decreased calories in diet.  Patient was given sample diet with ideas for breakfast, lunch and dinner.   5.  Recommend daily calorie goal of 1500 - 1800 calories.  She is to restart using calorie tracking app on phone.  Recommended she eat small meal or snack every 3 to 4 hours. Very important to have breakfast.  6.  Recommended increase physical activity - goal is 150 minutes per week however patient is to not start physical activity until cleared by neurologist.  7. Follow up: 4 weeks with PCP and 8 weeks with me.

## 2016-01-15 NOTE — Patient Instructions (Signed)
Prediabetes Many people have heard about type 2 diabetes, but its common precursor, prediabetes, doesn't get as much attention. Prediabetes is estimated by CDC to affect 86 million Americans (this includes 51% of people 65 years and older), and an estimated 90% of people with prediabetes don't even know it. According to the CDC, 15-30% of these individuals will develop type 2 diabetes within five years. In other words, as many as 26 million people that currently have prediabetes could develop type 2 diabetes by 2020, effectively doubling the number of people with type 2 diabetes in the [place].  What is prediabetes? Prediabetes is a condition where blood sugar levels are higher than normal, but not high enough to be diagnosed as type 2 diabetes. This occurs when the body has problems in processing glucose properly, and sugar starts to build up in the bloodstream instead of fueling cells in muscles and tissues. Insulin is the hormone that tells cells to take up glucose, and in prediabetes, people typically initially develop insulin resistance (where the body's cells can't respond to insulin as well), and over time (if no actions are taken to reverse the situation) the ability to produce sufficient insulin is reduced. People with prediabetes also commonly have high blood pressure as well as abnormal blood lipids (e.g. cholesterol). These often occur prior to the rise of blood glucose levels.  What are the symptoms of prediabetes? People typically do not have symptoms of prediabetes, which is partially why up to 90% of people don't know they have it. The ADA reports that some people with prediabetes may develop symptoms of type 2 diabetes, though even many people diagnosed with type 2 diabetes show little or no symptoms initially at diagnosis.  How is prediabetes diagnosed? According to the American Diabetes Association, prediabetes can be diagnosed through one of the following tests: 1. A glycated hemoglobin  test, also known as HbA1c or simply A1c, gives an idea of the body's average blood sugar levels from the past two or three months. It is usually done with a small drop of blood from a fingerstick or as part of having blood taken in a doctor's office, hospital, or laboratory. A1c Level Diagnosis  Less than 5.7% Normal  5.7% to 6.4% Prediabetes  6.5% and higher Diabetes  2. A fasting plasma glucose (FPG) test measures a person's blood glucose level after fasting (not eating) for eight hours - this is typically done in the morning. If a test shows positive for prediabetes, a second test should be taken on a different day to confirm the diagnosis. FPG Level Diagnosis  Less than 100 mg/dl Normal  100 mg/dl to 125 mg/dl Prediabetes  126 mg/dl and higher Diabetes   Who is at risk of developing prediabetes? A well-known paper published in the Lancet in 2010 recommends screening for type 2 diabetes (which would also screen for prediabetes) every 3-5 years in all adults over the age of 45, regardless of other risk factors. Overweight and obese adults (a BMI >25 kg/m2) are also at significantly greater risk for developing prediabetes, as well as people with a family history of type 2 diabetes. According to the CDC, several other factors can have moderate influences on prediabetes risk in addition to age, weight, and family history: People with an African American, Hispanic/Latino, American Indian, Asian American, or Pacific Islander racial or ethnic background. The 2015 ADA Standards of Medical Care recommendations suggest Asian Americans with a BMI of 23 or above be screened for type 2 diabetes.    Women with a history of diabetes during pregnancy ("gestational diabetes") or have given birth to a baby weighing nine pounds or more. People who are physically active fewer than three times a week. The CDC offers a fast, online screening test for evaluating the risk for prediabetes. The ADA also offers a screening  test to assess type 2 diabetes risk. Of course, these tests do not themselves confirm a prediabetes diagnosis, but just if someone may be at higher risk of developing it.  Why do people develop prediabetes? Prediabetes develops through a combination of factors that are still being investigated. For sure, lifestyle factors (food, exercise, stress, sleep) play a role, but family history and genetics certainly do as well. It is easy to assume that prediabetes is the result of being overweight, but the relationship is not that simple. While obesity is one underlying cause of insulin resistance, many overweight individuals may never develop prediabetes or type 2 diabetes, and a minority of people with prediabetes have never been overweight. To make matters worse, it can be increasingly difficult to make healthy choices in today's toxic food environment that steers all of us to make the wrong food choices, and there are many factors that can contribute to weight gain in addition to diet.  Is a prediabetes diagnosis serious? There has been significant debate around the term 'prediabetes,' and whether it should be considered cause for alarm. On the one hand, it serves as a risk factor for type 2 diabetes and a host of other complications, including heart disease, and ultimately prediabetes implies that a degree of metabolic problems have started to occur in the body. On the other hand, it places a diagnosis on many people who may never develop type 2 diabetes. Again, according to the CDC, 15-30% of those with prediabetes will develop type 2 diabetes within five years. However, a 2012 Lancet article cites 5-10% of those with prediabetes each year will also revert back to healthy blood sugars. What's critical is not necessarily the cutoff itself, but where someone falls within the ranges listed above. The level of risk of developing type 2 diabetes is closely related to A1c or FPG at diagnosis. Those in the higher ranges  (A1c closer to 6.4%, FPG closer to 125 mg/dl) are much more likely to progress to type 2 diabetes, whereas those at lower ranges (A1c closer to 5.7%, FPG closer to 100 mg/dl) are relatively more likely to revert back to normal glucose levels or stay within the prediabetes range. Age of diagnosis and the level of insulin production still occurring at diagnosis also impact the chances of reverting to normoglycemia (normal blood sugar levels).  What can people with prediabetes do to avoid the progression from prediabetes to type 2 diabetes? The most important action people diagnosed with prediabetes can take is to focus on living a healthy lifestyle. This includes making healthy food choices, controlling portions, and increasing physical activity. Regarding weight control, research shows losing 5-7% (often about 10-20 lbs.) from your initial body weight and keeping off as much of that weight over time as possible is critical to lowering the risk of type 2 diabetes. This task is of course easier said than done, but sustained weight loss over time can be key to improving health and delaying or preventing the onset of type 2 diabetes. Several prediabetes interventions exist based on evidence from the landmark Diabetes Prevention Program (DPP) study. The DPP study reported that moderate weight loss (5-7% of body weight, or ~10-15 lbs.   for someone weighing 200 lbs.), counseling, and education on healthy eating and behavior reduced the risk of developing type 2 diabetes by 58%. Data presented at the ADA 2014 conference showed that after 15 years of follow-up of the DPP study groups, the results were still encouraging: 27% of those in the original lifestyle group had a significant reduction in type 2 diabetes progression compared to the control group. If you or someone you know has been told they have prediabetes, here are a few helpful resources: In-person diabetes prevention programs: The CDC offers a one year long  lifestyle change program through its National Diabetes Prevention Program (NDPP) at various locations throughout the [place] to help participants adopt healthy habits and prevent or delay progression to type 2 diabetes. This program is a major undertaking by the CDC to translate the findings from the DPP study into a real world setting, a significant effort indeed! Online diabetes prevention programs: The CDC has now given pending recognition status to three digital prevention programs: [facility] [facility], [facility], and [facility]. These offer the same one year long educational curriculum as the DPP study, but in an online format. Some insurance companies and employers cover these programs, and you can find more information at the links above. These digital versions are excellent options for those who live far away from NDPP locations or who prefer the anonymity and convenience of doing the program online. Metformin: The DPP study found that metformin, the safest first-line therapy for type 2 diabetes, may help delay the onset of type 2 diabetes in people with prediabetes. Participants who took the low-cost generic drug had a 31% reduced risk of developing type 2 diabetes compared to the control group (those not on metformin or intensive lifestyle intervention). Again, 15-year follow up data showed that 17% of those on metformin continued to have a significant reduction in type 2 progression. At this time, metformin (or any other medication, for that matter) is not currently FDA approved for prediabetes, and it is sometimes prescribed "off-label" by a healthcare provider. Your healthcare provider can give you more information and determine whether metformin is a good option for you.  Can prediabetes be "cured"? In the early stages of prediabetes (and type 2 diabetes), diligent attention to food choices and activity, and most importantly weight loss, can improve blood sugar numbers, effectively "reversing" the disease  and reducing the odds of developing type 2 diabetes. However, some people may have underlying factors (such as family history and genetics) that put them at a greater risk of type 2 diabetes, meaning they will always require careful attention to blood sugar levels and lifestyle choices. Returning to old habits will likely put someone back on the road to prediabetes, and eventually, type 2 diabetes   

## 2016-01-16 ENCOUNTER — Encounter: Payer: Self-pay | Admitting: Family Medicine

## 2016-01-16 ENCOUNTER — Other Ambulatory Visit: Payer: Medicaid Other

## 2016-01-16 ENCOUNTER — Ambulatory Visit (INDEPENDENT_AMBULATORY_CARE_PROVIDER_SITE_OTHER): Payer: Medicaid Other

## 2016-01-16 ENCOUNTER — Encounter: Payer: Self-pay | Admitting: Obstetrics & Gynecology

## 2016-01-16 DIAGNOSIS — R1031 Right lower quadrant pain: Secondary | ICD-10-CM | POA: Diagnosis not present

## 2016-01-16 DIAGNOSIS — N888 Other specified noninflammatory disorders of cervix uteri: Secondary | ICD-10-CM | POA: Diagnosis not present

## 2016-01-16 DIAGNOSIS — R140 Abdominal distension (gaseous): Secondary | ICD-10-CM | POA: Diagnosis not present

## 2016-01-16 DIAGNOSIS — N854 Malposition of uterus: Secondary | ICD-10-CM

## 2016-01-16 NOTE — Progress Notes (Signed)
PELVIC US TA/TV, homogenous anteverted uterus, EEC 6 mm,normal ov's bilat,bilat Essure coils were seen in the adnexa's,no free fluid,mult small nabothian cysts,ov's appear to be mobile,no pain during ultrasound

## 2016-01-18 ENCOUNTER — Ambulatory Visit (INDEPENDENT_AMBULATORY_CARE_PROVIDER_SITE_OTHER): Payer: Medicaid Other | Admitting: Neurology

## 2016-01-18 DIAGNOSIS — R202 Paresthesia of skin: Secondary | ICD-10-CM | POA: Diagnosis not present

## 2016-01-18 DIAGNOSIS — M542 Cervicalgia: Secondary | ICD-10-CM | POA: Diagnosis not present

## 2016-01-18 DIAGNOSIS — M792 Neuralgia and neuritis, unspecified: Secondary | ICD-10-CM | POA: Diagnosis not present

## 2016-01-18 DIAGNOSIS — M79609 Pain in unspecified limb: Secondary | ICD-10-CM | POA: Insufficient documentation

## 2016-01-18 NOTE — Progress Notes (Signed)
HISTORICAL  Heidi [name] is a 25 year old right-handed female, accompanied by her boyfriend Heidi [name], seen in refer by her primary care physician  Dr Elige Radon Dettinger in November 29 2015 for evaluation of numbness  She had past medical history of hypertension, depression anxiety  She started to notice bilateral plantar feet paresthesia since December 2014, initial symptoms or burning sensation at bilateral plantar feet, as if she steps on sharp object, it gradually spreading to the top of her feet, also intermittently involving posterior thigh with associated low back pain. She was getting a diagnosis of peripheral neuropathy, lumbosacral radiculopathy by [facility] neurologists, per patient, there was no test was performed, she was treated with Norco 10/325 mg 3 times a day, gabapentin up to 600 mg 3 times a day, she complains of lightheadedness with the medication.  She lost insurance from 2015 till recently, over the past 2 years, she had increased bilateral lower extremity paresthesia, also began to involving bilateral upper extremity, especially since September 2016, she complains of bilateral wrist pain, right hand paresthesia, wrist pain with deep palpitation, she also has intermittent left wrist pain, she has worsening low back pain, left upper back tender spots,   She denies gait difficulty,No bowel and bladder incontinence, she has mild neck pain, radiating pain to bilateral shoulder.  In addition, she complains long-standing history of intermittent headache, usually preceded by visual distortion, followed by left side severe pounding headache was associated light noise sensitivity, nauseous, lasting for couple hours, relieved by lying down resting for a while,  She was started on Lyrica 50 mg 3 times a day, tramadol 50 mg 3 times a day since February 2017, which does help her symptoms,,  We reviewed laboratory evaluation January 2017, normal CMP, CBC, TSH  Update Jan 18 2016:  Patient returned for electrodiagnostic study today which was normal there was no evidence of large fiber peripheral neuropathy of bilateral lumbar radiculopathy  We also reviewed laboratory evaluation in 2017, mild elevated WBC 13, normal CMP with exception of mild elevated glucose, A1c was 6.0, vitamin D was mildly decreased 22, positive ANA,  She complains of significant body achy pain, low back pain, neck pain, bilateral lower extremity, occasionally upper extremity paresthesia, difficulty moving around  REVIEW OF SYSTEMS: Full 14 system review of systems performed and notable only for: Fatigue, palpitation, shortness of breath, cough, increased thirst, joint pain, cramps, allergy, memory loss, headache, numbness, weakness, dizziness, insomnia, sleepiness, depression, anxiety, decreased energy.  ALLERGIES: Allergies  Allergen Reactions  . Lorazepam     Sedation  . Morphine And Related Other (See Comments)    Makes pt.aggressive  . Quetiapine     Sweating, irritability    HOME MEDICATIONS: Current Outpatient Prescriptions  Medication Sig Dispense Refill  . cholecalciferol (VITAMIN D) 1000 units tablet Take 1,000 Units by mouth daily.    . cyclobenzaprine (FLEXERIL) 10 MG tablet Take 10 mg by mouth as needed for muscle spasms.    . haloperidol (HALDOL) 0.5 MG tablet Take 0.5 mg by mouth at bedtime.     Marland Kitchen lisinopril (PRINIVIL,ZESTRIL) 20 MG tablet Take 1 tablet (20 mg total) by mouth daily. 90 tablet 3  . pregabalin (LYRICA) 50 MG capsule Take 1 capsule (50 mg total) by mouth 3 (three) times daily. 90 capsule 2  . sertraline (ZOLOFT) 100 MG tablet Take 100 mg by mouth at bedtime.     . traMADol (ULTRAM) 50 MG tablet Take 1 tablet (50 mg total) by mouth every  8 (eight) hours as needed. 30 tablet 1  . traZODone (DESYREL) 50 MG tablet Take 50 mg by mouth at bedtime as needed for sleep.     No current facility-administered medications for this visit.    PAST MEDICAL  HISTORY: Past Medical History  Diagnosis Date  . Neuropathy (HCC)     idopathic  . Anxiety   . Depression   . Schizo-affective schizophrenia (HCC)   . Back pain   . Hypertension   . Abdominal pain 01/10/2016  . Urinary frequency 01/10/2016  . Hematuria 01/10/2016  . Bloating 01/10/2016  . Herpes simplex virus (HSV) infection   . Prediabetes     PAST SURGICAL HISTORY: Past Surgical History  Procedure Laterality Date  . Dilation and curettage of uterus    . Cesarean section    . Essure tubal ligation    . Tubal ligation      FAMILY HISTORY: Family History  Problem Relation Age of Onset  . Depression Mother   . Hypertension Mother   . Diabetes Mother   . Mental illness Father   . Hypertension Father   . Diabetes Father   . Cancer Maternal Aunt   . Diabetes Paternal Grandmother   . Hypertension Paternal Grandmother   . Arthritis Paternal Grandmother   . Stroke Paternal Grandfather   . Heart disease Paternal Grandfather   . Hypertension Paternal Grandfather   . Diabetes Paternal Grandfather   . Schizophrenia Maternal Grandfather   . Alcohol abuse Maternal Grandfather   . Other Sister     recovering addict  . Schizophrenia Sister   . Irritable bowel syndrome Sister   . Other Sister     recovering addict  . Bipolar disorder Sister   . Seizures Sister     SOCIAL HISTORY:  Social History   Social History  . Marital Status: Legally Separated    Spouse Name: N/A  . Number of Children: 1  . Years of Education: HS   Occupational History  . Unemployed    Social History Main Topics  . Smoking status: Current Every Day Smoker -- 1.00 packs/day for 10 years    Types: Cigarettes  . Smokeless tobacco: Never Used  . Alcohol Use: 0.0 oz/week    0 Standard drinks or equivalent per week     Comment: Occasionally   . Drug Use: Yes    Special: Marijuana     Comment: Occasionally  . Sexual Activity: Yes    Birth Control/ Protection: Surgical     Comment: tubal    Other Topics Concern  . Not on file   Social History Narrative   Lives at home with her boyfriend.   Right-handed.   Drinks approximately two 2-liter sodas per day.   Drinks 1 cup coffee per day.        PHYSICAL EXAM   There were no vitals filed for this visit.  Not recorded      There is no weight on file to calculate BMI.  PHYSICAL EXAMNIATION:  Gen: NAD, conversant, well nourised, obese, well groomed                     Cardiovascular: Regular rate rhythm, no peripheral edema, warm, nontender. Eyes: Conjunctivae clear without exudates or hemorrhage Neck: Supple, no carotid bruise. Pulmonary: Clear to auscultation bilaterally   NEUROLOGICAL EXAM:  MENTAL STATUS: Speech:    Speech is normal; fluent and spontaneous with normal comprehension.  Cognition:     Orientation to  time, place and person     Normal recent and remote memory     Normal Attention span and concentration     Normal Language, naming, repeating,spontaneous speech     Fund of knowledge   CRANIAL NERVES: CN II: Visual fields are full to confrontation. Fundoscopic exam is normal with sharp discs and no vascular changes. Pupils are round equal and briskly reactive to light. CN III, IV, VI: extraocular movement are normal. No ptosis. CN V: Facial sensation is intact to pinprick in all 3 divisions bilaterally. Corneal responses are intact.  CN VII: Face is symmetric with normal eye closure and smile. CN VIII: Hearing is normal to rubbing fingers CN IX, X: Palate elevates symmetrically. Phonation is normal. CN XI: Head turning and shoulder shrug are intact CN XII: Tongue is midline with normal movements and no atrophy.  MOTOR: There is no pronator drift of out-stretched arms. Muscle bulk and tone are normal. Muscle strength is normal.  REFLEXES: Reflexes are 2+ and symmetric at the biceps, triceps, knees, and ankles. Plantar responses are flexor.  SENSORY: Mildly length dependent decreased to  light touch, pinprick to ankle level, well-preserved positional sensation and vibration sensation at fingers and toes  COORDINATION: Rapid alternating movements and fine finger movements are intact. There is no dysmetria on finger-to-nose and heel-knee-shin.    GAIT/STANCE: Posture is normal. Gait is steady with normal steps, base, arm swing, and turning. Heel and toe walking are normal. Tandem gait is normal.  Romberg is absent.   DIAGNOSTIC DATA (LABS, IMAGING, TESTING) - I reviewed patient records, labs, notes, testing and imaging myself where available.   ASSESSMENT AND PLAN  Heidi [name] is a 25 y.o. female   Paresthesia  Involving both upper and lower extremity, today's electrodiagnostic testing failed to demonstrate large fiber peripheral neuropathy or lumbosacral radiculopathy  Potential localization to central nervous system,  Proceed with MRI of cervical spine Vitamin D deficiency:  Start over-the-counter vitamin D3 supplement 1000 units daily Chronic migraine with aura  Ibuprofen, Tylenol as needed   Levert FeinsteinYijun Tredarius Cobern, M.D. Ph.D.  [facility] [address], [address] [place], [place] [address] Ph: [phone] Fax: [phone]  CC:  Nils PyleJoshua A Dettinger, MD

## 2016-01-18 NOTE — Procedures (Signed)
   NCS (NERVE CONDUCTION STUDY) WITH EMG (ELECTROMYOGRAPHY) REPORT   STUDY DATE: Jan 18 2016 PATIENT NAME: Heidi [name] DOB: 02/05/1991 MRN: [id number]    TECHNOLOGIST: Gearldine ShownLorraine Jones ELECTROMYOGRAPHER: Levert FeinsteinYan, Keelon Zurn M.D.  CLINICAL INFORMATION:  25 year old female presented with bilateral feet paresthesia since 2014, progressively getting worse, also complains of chronic neck low back pain.  FINDINGS: NERVE CONDUCTION STUDY: Left peroneal sensory response was normal. Left peroneal to EDB, left tibial motor responses were normal. Bilateral median, ulnar sensory and motor responses were normal.  NEEDLE ELECTROMYOGRAPHY: Selective needle examinations were performed at bilateral lower extremity muscles and bilateral lumbar sacral paraspinal muscles.  Needle examination of bilateral tibialis anterior, tibialis posterior, vastus lateralis were normal.  There was no spontaneous activity at bilateral lumbosacral paraspinal muscles, bilateral L4-5 S1.  IMPRESSION:   This is an abnormal study, there is a no electrodiagnostic evidence of large fiber peripheral neuropathy, or bilateral lumbosacral radiculopathy.   INTERPRETING PHYSICIAN:   Levert FeinsteinYan, Placido Hangartner M.D. Ph.D. [facility] [address], [address] [place], [place] [address] [phone]

## 2016-01-19 ENCOUNTER — Other Ambulatory Visit: Payer: Self-pay

## 2016-01-19 MED ORDER — PREDNISONE 20 MG PO TABS: ORAL_TABLET | ORAL | Status: DC

## 2016-01-22 ENCOUNTER — Encounter: Payer: Self-pay | Admitting: Family Medicine

## 2016-01-22 MED ORDER — CYCLOBENZAPRINE HCL 10 MG PO TABS: 10.0000 mg | ORAL_TABLET | Freq: Three times a day (TID) | ORAL | Status: DC | PRN

## 2016-01-27 ENCOUNTER — Encounter: Payer: Self-pay | Admitting: Family Medicine

## 2016-01-31 ENCOUNTER — Other Ambulatory Visit: Payer: Self-pay

## 2016-01-31 DIAGNOSIS — M25569 Pain in unspecified knee: Secondary | ICD-10-CM

## 2016-01-31 DIAGNOSIS — M25579 Pain in unspecified ankle and joints of unspecified foot: Secondary | ICD-10-CM

## 2016-01-31 MED ORDER — PREDNISONE 20 MG PO TABS: ORAL_TABLET | ORAL | Status: DC

## 2016-01-31 MED ORDER — MELOXICAM 7.5 MG PO TABS: 7.5000 mg | ORAL_TABLET | Freq: Every day | ORAL | Status: DC

## 2016-02-12 ENCOUNTER — Ambulatory Visit (INDEPENDENT_AMBULATORY_CARE_PROVIDER_SITE_OTHER): Payer: Medicaid Other | Admitting: Family Medicine

## 2016-02-12 ENCOUNTER — Encounter: Payer: Self-pay | Admitting: Family Medicine

## 2016-02-12 VITALS — BP 134/75 | HR 86 | Temp 97.4°F | Ht 67.0 in | Wt 311.2 lb

## 2016-02-12 DIAGNOSIS — J4520 Mild intermittent asthma, uncomplicated: Secondary | ICD-10-CM

## 2016-02-12 DIAGNOSIS — I10 Essential (primary) hypertension: Secondary | ICD-10-CM

## 2016-02-12 DIAGNOSIS — R7303 Prediabetes: Secondary | ICD-10-CM | POA: Diagnosis not present

## 2016-02-12 LAB — BAYER DCA HB A1C WAIVED: HB A1C: 6.6 % (ref <7.0)

## 2016-02-12 MED ORDER — ALBUTEROL SULFATE HFA 108 (90 BASE) MCG/ACT IN AERS: 2.0000 | INHALATION_SPRAY | Freq: Four times a day (QID) | RESPIRATORY_TRACT | Status: DC | PRN

## 2016-02-12 MED ORDER — METFORMIN HCL 500 MG PO TABS: 500.0000 mg | ORAL_TABLET | Freq: Two times a day (BID) | ORAL | Status: DC

## 2016-02-12 NOTE — Addendum Note (Signed)
Addended by: Quay BurowPOTTER, Jachelle Fluty K on: 02/12/2016 02:03 PM   Modules accepted: Orders, SmartSet

## 2016-02-12 NOTE — Progress Notes (Signed)
BP 134/75 mmHg  Pulse 86  Temp(Src) 97.4 F (36.3 C) (Oral)  Ht  (1.702 m)  Wt 311 lb 3.2 oz (141.159 kg)  BMI 48.73 kg/m2  LMP 01/15/2016 (Approximate)   Subjective:    Patient ID: Heidi [name], female    DOB: 03/23/1991, 25 y.o.   MRN: [id number]  HPI: Heidi [name] is a 25 y.o. female presenting on 02/12/2016 for Hypertension and Asthma   HPI Hypertension recheck Patient is coming in for blood pressure recheck today. Her blood pressure is 134/75. She has had normal numbers that her right about there will when she checks it at home. She denies any issues with the medication. She says sometimes she feels flushed and inhalers for the pulsating ears at night and thinks her blood pressure may be up as she's never checked it when it's like that. Patient denies headaches, blurred vision, chest pains, shortness of breath, or weakness. Denies any side effects from medication and is content with current medication.   Prediabetes Patient is coming in for recheck for prediabetes. Her last hemoglobin A1c was 6.0. She has been trying to do diet Is down 3 pounds from previous but has not changed her diet or exercise that much from previous. She denies any issues with her feet or her vision.  Wheezing and chest tightness Patient comes in today with complaints of wheezing and chest tightness is been going on for the past few weeks. She says is been intermittent. She said she was diagnosed with asthma which was a child. She does admit that she is still smoking over a pack per day. She denies any fevers or chills or shortness of breath currently in the office today but does have this intermittent chest tightness and would at least like her rescue inhaler to help when she gets like this. She says it's been happening about 2 times a week currently. She is not taking an allergy pill despite having some allergic symptoms such as rhinitis and postnasal drip with the seasonal changes. She has a cough is  nonproductive.  Relevant past medical, surgical, family and social history reviewed and updated as indicated. Interim medical history since our last visit reviewed. Allergies and medications reviewed and updated.  Review of Systems  Constitutional: Negative for fever and chills.  HENT: Negative for congestion, ear discharge and ear pain.   Eyes: Negative for redness and visual disturbance.  Respiratory: Positive for cough, chest tightness, shortness of breath and wheezing.   Cardiovascular: Negative for chest pain and leg swelling.  Genitourinary: Negative for dysuria and difficulty urinating.  Musculoskeletal: Negative for back pain and gait problem.  Skin: Negative for rash.  Neurological: Negative for light-headedness and headaches.  Psychiatric/Behavioral: Negative for behavioral problems and agitation.  All other systems reviewed and are negative.   Per HPI unless specifically indicated above     Medication List       This list is accurate as of: 02/12/16 10:19 AM.  Always use your most recent med list.               albuterol 108 (90 Base) MCG/ACT inhaler  Commonly known as:  PROVENTIL HFA;VENTOLIN HFA  Inhale 2 puffs into the lungs every 6 (six) hours as needed for wheezing or shortness of breath.     cholecalciferol 1000 units tablet  Commonly known as:  VITAMIN D  Take 1,000 Units by mouth daily.     cyclobenzaprine 10 MG tablet  Commonly known as:  FLEXERIL  Take 1 tablet (10 mg total) by mouth 3 (three) times daily as needed for muscle spasms.     Fish Oil 1200 MG Caps  Take 1,200 mg by mouth.     haloperidol 0.5 MG tablet  Commonly known as:  HALDOL  Take 0.5 mg by mouth at bedtime.     lisinopril 20 MG tablet  Commonly known as:  PRINIVIL,ZESTRIL  Take 1 tablet (20 mg total) by mouth daily.     meloxicam 7.5 MG tablet  Commonly known as:  MOBIC  Take 1 tablet (7.5 mg total) by mouth daily.     Milk Thistle 1000 MG Caps  Take by mouth.      sertraline 100 MG tablet  Commonly known as:  ZOLOFT  Take 100 mg by mouth at bedtime.     traZODone 50 MG tablet  Commonly known as:  DESYREL  Take 50 mg by mouth at bedtime as needed for sleep.     Turmeric 500 MG Caps  Take 1,500 mg by mouth.     Zinc 100 MG Tabs  Take by mouth.           Objective:    BP 134/75 mmHg  Pulse 86  Temp(Src) 97.4 F (36.3 C) (Oral)  Ht 5\' 7"  (1.702 m)  Wt 311 lb 3.2 oz (141.159 kg)  BMI 48.73 kg/m2  LMP 01/15/2016 (Approximate)  Wt Readings from Last 3 Encounters:  02/12/16 311 lb 3.2 oz (141.159 kg)  01/15/16 302 lb (136.986 kg)  01/10/16 304 lb (137.893 kg)    Physical Exam  Constitutional: She is oriented to person, place, and time. She appears well-developed and well-nourished. No distress.  HENT:  Right Ear: External ear normal.  Left Ear: External ear normal.  Nose: Nose normal.  Mouth/Throat: Oropharynx is clear and moist. No oropharyngeal exudate.  Eyes: Conjunctivae and EOM are normal. Pupils are equal, round, and reactive to light.  Neck: Neck supple. No thyromegaly present.  Cardiovascular: Normal rate, regular rhythm, normal heart sounds and intact distal pulses.   No murmur heard. Pulmonary/Chest: Effort normal and breath sounds normal. No respiratory distress. She has no wheezes. She has no rales.  Musculoskeletal: Normal range of motion. She exhibits no edema or tenderness.  Lymphadenopathy:    She has no cervical adenopathy.  Neurological: She is alert and oriented to person, place, and time. Coordination normal.  Skin: Skin is warm and dry. No rash noted. She is not diaphoretic.  Psychiatric: She has a normal mood and affect. Her behavior is normal.  Nursing note and vitals reviewed.     Assessment & Plan:   Problem List Items Addressed This Visit      Cardiovascular and Mediastinum   Essential hypertension, benign - Primary     Other   Prediabetes   Relevant Orders   Bayer DCA Hb A1c Waived    Other  Visit Diagnoses    Asthma, mild intermittent, uncomplicated        Relevant Medications    albuterol (PROVENTIL HFA;VENTOLIN HFA) 108 (90 Base) MCG/ACT inhaler        Follow up plan: Return in about 3 months (around 05/14/2016), or if symptoms worsen or fail to improve, for Recheck blood pressure and prediabetes.  Counseling provided for all of the vaccine components Orders Placed This Encounter  Procedures  . [facility] Hb A1c Waived    Arville CareJoshua Mushka Laconte, MD [facility] 02/12/2016, 10:19 AM

## 2016-02-14 ENCOUNTER — Encounter: Payer: Self-pay | Admitting: Family Medicine

## 2016-02-19 ENCOUNTER — Encounter: Payer: Self-pay | Admitting: Family Medicine

## 2016-02-24 ENCOUNTER — Encounter: Payer: Self-pay | Admitting: Family Medicine

## 2016-02-27 ENCOUNTER — Ambulatory Visit: Payer: Medicaid Other | Admitting: Neurology

## 2016-02-29 ENCOUNTER — Encounter: Payer: Self-pay | Admitting: Family Medicine

## 2016-02-29 ENCOUNTER — Ambulatory Visit: Payer: Medicaid Other | Admitting: Family Medicine

## 2016-02-29 NOTE — Telephone Encounter (Signed)
Patient called and appointment scheduled for today at 4:25pm.

## 2016-03-01 ENCOUNTER — Encounter: Payer: Self-pay | Admitting: Family Medicine

## 2016-03-01 ENCOUNTER — Ambulatory Visit (INDEPENDENT_AMBULATORY_CARE_PROVIDER_SITE_OTHER): Payer: Medicaid Other | Admitting: Family Medicine

## 2016-03-01 VITALS — BP 139/81 | HR 83 | Temp 98.0°F | Ht 67.0 in | Wt 311.6 lb

## 2016-03-01 DIAGNOSIS — I10 Essential (primary) hypertension: Secondary | ICD-10-CM | POA: Diagnosis not present

## 2016-03-01 MED ORDER — AMLODIPINE BESYLATE 2.5 MG PO TABS: 2.5000 mg | ORAL_TABLET | Freq: Every day | ORAL | Status: DC

## 2016-03-01 MED ORDER — CYCLOBENZAPRINE HCL 10 MG PO TABS
10.0000 mg | ORAL_TABLET | Freq: Three times a day (TID) | ORAL | Status: DC | PRN
Start: 2016-03-01 — End: 2016-08-02

## 2016-03-01 NOTE — Progress Notes (Signed)
BP 139/81 mmHg  Pulse 83  Temp(Src) 98 F (36.7 C) (Oral)  Ht 5\' 7"  (1.702 m)  Wt 311 lb 9.6 oz (141.341 kg)  BMI 48.79 kg/m2  LMP 01/15/2016 (Approximate)   Subjective:    Patient ID: Heidi [name], female    DOB: 07/29/1991, 25 y.o.   MRN: [id number]  HPI: Heidi MyrtleJessica Agresti is a 25 y.o. female presenting on 03/01/2016 for Hypertension and Medication Refill   HPI Hypertension. Patient comes in today for hypertension checkup. Her blood pressure today is 139/81 and she has been running about that when she comes for office. She brings her home meter with her so she can check it against ours and it is accurate today against ours. At home in the evening she says she's been consistently getting blood pressures in the 160s and 170s over 100. After she waits an hour it will come down to 145/95. She was just concerned because her blood pressure still been running so high. She is currently on lisinopril 20 mg. Patient denies headaches, blurred vision, chest pains, shortness of breath, or weakness. Denies any side effects from medication and is content with current medication.   Relevant past medical, surgical, family and social history reviewed and updated as indicated. Interim medical history since our last visit reviewed. Allergies and medications reviewed and updated.  Review of Systems  Constitutional: Negative for fever and chills.  HENT: Negative for congestion, ear discharge and ear pain.   Eyes: Negative for redness and visual disturbance.  Respiratory: Negative for chest tightness and shortness of breath.   Cardiovascular: Negative for chest pain and leg swelling.  Genitourinary: Negative for dysuria and difficulty urinating.  Musculoskeletal: Negative for back pain and gait problem.  Skin: Negative for rash.  Neurological: Negative for dizziness, light-headedness and headaches.  Psychiatric/Behavioral: Negative for behavioral problems and agitation.  All other systems reviewed and  are negative.   Per HPI unless specifically indicated above     Medication List       This list is accurate as of: 03/01/16 10:51 AM.  Always use your most recent med list.               albuterol 108 (90 Base) MCG/ACT inhaler  Commonly known as:  PROVENTIL HFA;VENTOLIN HFA  Inhale 2 puffs into the lungs every 6 (six) hours as needed for wheezing or shortness of breath.     amLODipine 2.5 MG tablet  Commonly known as:  NORVASC  Take 1 tablet (2.5 mg total) by mouth daily.     cholecalciferol 1000 units tablet  Commonly known as:  VITAMIN D  Take 1,000 Units by mouth daily.     cyclobenzaprine 10 MG tablet  Commonly known as:  FLEXERIL  Take 1 tablet (10 mg total) by mouth 3 (three) times daily as needed for muscle spasms.     Fish Oil 1200 MG Caps  Take 1,200 mg by mouth.     haloperidol 0.5 MG tablet  Commonly known as:  HALDOL  Take 0.5 mg by mouth at bedtime.     lisinopril 20 MG tablet  Commonly known as:  PRINIVIL,ZESTRIL  Take 1 tablet (20 mg total) by mouth daily.     meloxicam 7.5 MG tablet  Commonly known as:  MOBIC  Take 1 tablet (7.5 mg total) by mouth daily.     metFORMIN 500 MG tablet  Commonly known as:  GLUCOPHAGE  Take 1 tablet (500 mg total) by mouth 2 (two) times daily  with a meal.     Milk Thistle 1000 MG Caps  Take by mouth.     OLANZapine 10 MG tablet  Commonly known as:  ZYPREXA  Take 10 mg by mouth at bedtime.     sertraline 100 MG tablet  Commonly known as:  ZOLOFT  Take 100 mg by mouth at bedtime.     traZODone 50 MG tablet  Commonly known as:  DESYREL  Take 50 mg by mouth at bedtime as needed for sleep.     Turmeric 500 MG Caps  Take 1,500 mg by mouth.     Zinc 100 MG Tabs  Take by mouth.           Objective:    BP 139/81 mmHg  Pulse 83  Temp(Src) 98 F (36.7 C) (Oral)  Ht 5\' 7"  (1.702 m)  Wt 311 lb 9.6 oz (141.341 kg)  BMI 48.79 kg/m2  LMP 01/15/2016 (Approximate)  Wt Readings from Last 3 Encounters:    03/01/16 311 lb 9.6 oz (141.341 kg)  02/12/16 311 lb 3.2 oz (141.159 kg)  01/15/16 302 lb (136.986 kg)    Physical Exam  Constitutional: She is oriented to person, place, and time. She appears well-developed and well-nourished. No distress.  Eyes: Conjunctivae and EOM are normal. Pupils are equal, round, and reactive to light.  Neck: Neck supple. No thyromegaly present.  Cardiovascular: Normal rate, regular rhythm, normal heart sounds and intact distal pulses.   No murmur heard. Pulmonary/Chest: Effort normal and breath sounds normal. No respiratory distress. She has no wheezes.  Musculoskeletal: Normal range of motion. She exhibits no edema or tenderness.  Lymphadenopathy:    She has no cervical adenopathy.  Neurological: She is alert and oriented to person, place, and time. Coordination normal.  Skin: Skin is warm and dry. No rash noted. She is not diaphoretic.  Psychiatric: She has a normal mood and affect. Her behavior is normal.  Nursing note and vitals reviewed.   Results for orders placed or performed in visit on 02/12/16  Bayer DCA Hb A1c Waived  Result Value Ref Range   Bayer DCA Hb A1c Waived 6.6 <7.0 %      Assessment & Plan:   Problem List Items Addressed This Visit      Cardiovascular and Mediastinum   Essential hypertension, benign - Primary    Patient has been getting elevated blood pressures in the 170s over 100s and 160s over 100 in the evening to home. She was splitting the lisinopril taking half in the morning and half in the evening but it was not working. We will add amlodipine 2.5 mg in the evening.      Relevant Medications   amLODipine (NORVASC) 2.5 MG tablet       Follow up plan: Return in about 4 weeks (around 03/29/2016), or if symptoms worsen or fail to improve, for Hypertension recheck.  Counseling provided for all of the vaccine components No orders of the defined types were placed in this encounter.    Arville CareJoshua Reynalda Canny, MD [facility] 03/01/2016, 10:51 AM

## 2016-03-01 NOTE — Assessment & Plan Note (Signed)
Patient has been getting elevated blood pressures in the 170s over 100s and 160s over 100 in the evening to home. She was splitting the lisinopril taking half in the morning and half in the evening but it was not working. We will add amlodipine 2.5 mg in the evening.

## 2016-03-18 ENCOUNTER — Ambulatory Visit: Payer: Self-pay | Admitting: Pharmacist

## 2016-03-19 ENCOUNTER — Encounter: Payer: Self-pay | Admitting: Family Medicine

## 2016-03-28 ENCOUNTER — Ambulatory Visit (INDEPENDENT_AMBULATORY_CARE_PROVIDER_SITE_OTHER): Payer: Medicaid Other | Admitting: Pharmacist

## 2016-03-28 ENCOUNTER — Encounter: Payer: Self-pay | Admitting: Pharmacist

## 2016-03-28 DIAGNOSIS — E119 Type 2 diabetes mellitus without complications: Secondary | ICD-10-CM

## 2016-03-28 HISTORY — DX: Type 2 diabetes mellitus without complications: E11.9

## 2016-03-28 MED ORDER — ACCU-CHEK MULTICLIX LANCETS MISC: 3 refills | Status: DC

## 2016-03-28 MED ORDER — GLUCOSE BLOOD VI STRP: ORAL_STRIP | 3 refills | Status: DC

## 2016-03-28 NOTE — Progress Notes (Signed)
Subjective:    Danamarie Minami is a 25 y.o. female who presents for reevlauation of prediabetes which has progressed to diabetes.  Last A1c = 6.6%.  She has taken 2 courses of prednisone for all over joint pain and she has also started Zyprexa  qd about 1 month ago.   Her weight has increase which she attributes to several things - not feeling well and less activity, boyfriend has changed jobs and they have less income coming in and they cannot always afford to buy healthy foods.    Cardiovascular risk factors: hypertension, obesity (BMI >= 30 kg/m2), sedentary lifestyle and smoking/ tobacco exposure  Outpatient Encounter Prescriptions as of 03/28/2016  Medication Sig  . albuterol (PROVENTIL HFA;VENTOLIN HFA) 108 (90 Base) MCG/ACT inhaler Inhale 2 puffs into the lungs every 6 (six) hours as needed for wheezing or shortness of breath.  Marland Kitchen amLODipine (NORVASC) 2.5 MG tablet Take 1 tablet (2.5 mg total) by mouth daily.  . cholecalciferol (VITAMIN D) 1000 units tablet Take 1,000 Units by mouth daily.  . cyclobenzaprine (FLEXERIL) 10 MG tablet Take 1 tablet (10 mg total) by mouth 3 (three) times daily as needed for muscle spasms.  . haloperidol (HALDOL) 0.5 MG tablet Take 0.5 mg by mouth at bedtime.   Marland Kitchen lisinopril (PRINIVIL,ZESTRIL) 20 MG tablet Take 1 tablet (20 mg total) by mouth daily.   IBU   Take 4 tablet (=800mg ) every 6 hours as needed  . meloxicam (MOBIC) 7.5 MG tablet (not taking - states she was told by pharmacy she could not take this prior to dental extractions) Take 1 tablet (7.5 mg total) by mouth daily.  . metFORMIN (GLUCOPHAGE) 500 MG tablet Take 1 tablet (500 mg total) by mouth 2 (two) times daily with a meal.  . Milk Thistle 1000 MG CAPS Take by mouth.  . OLANZapine (ZYPREXA) 10 MG tablet Take 10 mg by mouth at bedtime.  . Omega-3 Fatty Acids (FISH OIL) 1200 MG CAPS Take 1,200 mg by mouth.  . sertraline (ZOLOFT) 100 MG tablet Take 100 mg by mouth at bedtime.   . traZODone  (DESYREL) 50 MG tablet Take 50 mg by mouth at bedtime as needed for sleep.  . Turmeric 500 MG CAPS Take 1,500 mg by mouth.  . Zinc 100 MG TABS Take by mouth.   No facility-administered encounter medications on file as of 03/28/2016.     Weight trend: increased by 10 lbs over last month Prior visit with CDE - yes Current diet: in general, an "unhealthy" diet  Patient drinks lots of regular soda, I eat "whatever is available" for lunch; dinner is usually a meat, vegetable and starch.  Current exercise: none - due to pain / nerve pain  Is She on ACE inhibitor or angiotensin II receptor blocker?  Yes  lisinopril (Prinivil)    The following portions of the patient's history were reviewed and updated as appropriate: allergies, current medications, past family history, past medical history, past social history, past surgical history and problem list.  Objective:    BP 130/80   Pulse 85   Ht  (1.702 m)   Wt (!) 322 lb (146.1 kg)   BMI 50.43 kg/m   A1c = 6.00% (11/29/2015) A1c = 6.6% (02/12/2016)   Lab Review Glucose (mg/dL)  Date Value  14/78/2956 69   Glucose, Bld (mg/dL)  Date Value  21/30/8657 108 (H)  05/16/2015 112 (H)  03/23/2015 125 (H)   CO2 (mmol/L)  Date Value  12/04/2015 27  09/26/2015 23  05/16/2015 26   BUN (mg/dL)  Date Value  30/13/1438 8  09/26/2015 7  05/16/2015 9  03/23/2015 13   Creatinine, Ser (mg/dL)  Date Value  88/75/7972 0.78  09/26/2015 0.74  05/16/2015 0.72    Assessment:   Type 2 DM - newly diagnosed.   Medication Management - on high doses of IBU and patient states not helping Obesity   Plan:    1.  Rx changes:   Continue metfromin XR 500mg  BID with food  Discussed other options for add ons - GLP-1 or SGLT which would also help with weight  Encouraged her to discuss weight increase with psychiatrist -- there might her other option she is willing to change Zyprexa to that are more weight neutral  Restart mobic since it  helps more with inflammation and pain.  Stop IBU since not helping  2.  Discussed BG goals and taught how to use Accu-Check Guide glucometer - Rx sent to pharmacy for testing supplies.  Advised to check QD at varying times.  Patient is to call me if BG is not decreasing to goals discussed.   3. Reviewed CHO counting diet and ways to decreased calories in diet.  Patient was given sample diet with ideas for breakfast, lunch and dinner.  Patient was given list of community resources for food assistance. 4.  Recommended increase physical activity - start with 10 or 20 minutes dialy and work up as able.  5. Follow up: PCP in 2 weeks and me in 3 months

## 2016-03-28 NOTE — Patient Instructions (Signed)
Diabetes and Standards of Medical Care   Diabetes is complicated. You may find that your diabetes team includes a dietitian, nurse, diabetes educator, eye doctor, and more. To help everyone know what is going on and to help you get the care you deserve, the following schedule of care was developed to help keep you on track. Below are the tests, exams, vaccines, medicines, education, and plans you will need.  Blood Glucose Goals Prior to meals = 80 - 130 Within 2 hours of the start of a meal = less than 180  HbA1c test (goal is less than 6.5% - your last value was 6.6%) This test shows how well you have controlled your glucose over the past 2 to 3 months. It is used to see if your diabetes management plan needs to be adjusted.   It is performed at least 2 times a year if you are meeting treatment goals.  It is performed 4 times a year if therapy has changed or if you are not meeting treatment goals.  Blood pressure test  This test is performed at every routine medical visit. The goal is less than 140/90 mmHg for most people, but 130/80 mmHg in some cases. Ask your health care provider about your goal.  Dental exam  Follow up with the dentist regularly.  Eye exam  If you are diagnosed with type 1 diabetes as a child, get an exam upon reaching the age of 51 years or older and have had diabetes for 3 to 5 years. Yearly eye exams are recommended after that initial eye exam.  If you are diagnosed with type 1 diabetes as an adult, get an exam within 5 years of diagnosis and then yearly.  If you are diagnosed with type 2 diabetes, get an exam as soon as possible after the diagnosis and then yearly.  Foot care exam  Visual foot exams are performed at every routine medical visit. The exams check for cuts, injuries, or other problems with the feet.  A comprehensive foot exam should be done yearly. This includes visual inspection as well as assessing foot pulses and testing for loss of  sensation.  Check your feet nightly for cuts, injuries, or other problems with your feet. Tell your health care provider if anything is not healing.  Kidney function test (urine microalbumin)  This test is performed once a year.  Type 1 diabetes: The first test is performed 5 years after diagnosis.  Type 2 diabetes: The first test is performed at the time of diagnosis.  A serum creatinine and estimated glomerular filtration rate (eGFR) test is done once a year to assess the level of chronic kidney disease (CKD), if present.  Lipid profile (cholesterol, HDL, LDL, triglycerides)  Performed every 5 years for most people.  The goal for LDL is less than 100 mg/dL. If you are at high risk, the goal is less than 70 mg/dL.  The goal for HDL is 40 mg/dL to 50 mg/dL for men and 50 mg/dL to 60 mg/dL for women. An HDL cholesterol of 60 mg/dL or higher gives some protection against heart disease.  The goal for triglycerides is less than 150 mg/dL.  Influenza vaccine, pneumococcal vaccine, and hepatitis B vaccine  The influenza vaccine is recommended yearly.  The pneumococcal vaccine is generally given once in a lifetime. However, there are some instances when another vaccination is recommended. Check with your health care provider.  The hepatitis B vaccine is also recommended for adults with diabetes.  Diabetes self-management education  Education is recommended at diagnosis and ongoing as needed.  Treatment plan  Your treatment plan is reviewed at every medical visit.  Document Released: 06/16/2009 Document Revised: 04/21/2013 Document Reviewed: 01/19/2013 [employer] Patient Information 2014 [employer].   

## 2016-04-01 ENCOUNTER — Encounter: Payer: Self-pay | Admitting: Family Medicine

## 2016-04-01 ENCOUNTER — Ambulatory Visit (INDEPENDENT_AMBULATORY_CARE_PROVIDER_SITE_OTHER): Payer: Medicaid Other | Admitting: Family Medicine

## 2016-04-01 VITALS — BP 134/86 | HR 78 | Temp 98.7°F | Ht 67.0 in | Wt 318.6 lb

## 2016-04-01 DIAGNOSIS — I10 Essential (primary) hypertension: Secondary | ICD-10-CM | POA: Diagnosis not present

## 2016-04-01 DIAGNOSIS — M545 Low back pain, unspecified: Secondary | ICD-10-CM

## 2016-04-01 DIAGNOSIS — E119 Type 2 diabetes mellitus without complications: Secondary | ICD-10-CM

## 2016-04-01 MED ORDER — METFORMIN HCL 500 MG PO TABS
500.0000 mg | ORAL_TABLET | Freq: Two times a day (BID) | ORAL | 1 refills | Status: DC
Start: 2016-04-01 — End: 2017-04-16

## 2016-04-01 MED ORDER — MELOXICAM 7.5 MG PO TABS: 15.0000 mg | ORAL_TABLET | Freq: Every day | ORAL | 2 refills | Status: DC

## 2016-04-01 MED ORDER — LANCETS 30G MISC: 11 refills | Status: DC

## 2016-04-01 NOTE — Progress Notes (Signed)
BP 134/86 (BP Location: Left Arm, Patient Position: Sitting, Cuff Size: Large)   Pulse 78   Temp 98.7 F (37.1 C) (Oral)   Ht 5\' 7"  (1.702 m)   Wt (!) 318 lb 9.6 oz (144.5 kg)   LMP 03/11/2016 (Approximate)   BMI 49.90 kg/m    Subjective:    Patient ID: Heidi [name], female    DOB: 1991/02/26, 25 y.o.   MRN: [id number]  HPI: Heidi [name] is a 25 y.o. female presenting on 04/01/2016 for Hypertension (followup, patient reports readings at home have been normal) and Back Pain (lower back, has had episodes of sharp pain recently)   HPI Hypertension recheck Patient is coming in for a blood pressure recheck today. She is currently on amlodipine and lisinopril. Her blood pressure today is 134/86 Patient denies headaches, blurred vision, chest pains, shortness of breath, or weakness. Denies any side effects from medication and is content with current medication.   Type 2 diabetes recheck Patient was recently diagnosed with type 2 diabetes and is been recently started on medications and also had education by Lyndal Pulley PhD. She has been doing well on her medications and denies any major issues. She has not seen an ophthalmologist yet this year. She denies any issues with her feet.  Midline low back pain and other arthralgias Patient has midline low back pain and some other aches in her joints that is something that we've known about and are trying to get her to a rheumatologist 4. She has been diagnosed with possible scleroderma. She has an appointment with them in September to get established. She has been on low-dose meloxicam 7.5 mg. She was that she can get to give her low but more. it was helping at the lower dose but now does not anymore.  Relevant past medical, surgical, family and social history reviewed and updated as indicated. Interim medical history since our last visit reviewed. Allergies and medications reviewed and updated.  Review of Systems  Constitutional: Negative for  chills and fever.  HENT: Negative for congestion, ear discharge and ear pain.   Eyes: Negative for redness and visual disturbance.  Respiratory: Negative for chest tightness and shortness of breath.   Cardiovascular: Negative for chest pain and leg swelling.  Genitourinary: Negative for difficulty urinating and dysuria.  Musculoskeletal: Positive for arthralgias, back pain and myalgias. Negative for gait problem and joint swelling.  Skin: Negative for rash.  Neurological: Negative for light-headedness and headaches.  Psychiatric/Behavioral: Negative for agitation and behavioral problems.  All other systems reviewed and are negative.   Per HPI unless specifically indicated above     Medication List       Accurate as of 04/01/16 10:38 AM. Always use your most recent med list.          albuterol 108 (90 Base) MCG/ACT inhaler Commonly known as:  PROVENTIL HFA;VENTOLIN HFA Inhale 2 puffs into the lungs every 6 (six) hours as needed for wheezing or shortness of breath.   amLODipine 2.5 MG tablet Commonly known as:  NORVASC Take 1 tablet (2.5 mg total) by mouth daily.   cholecalciferol 1000 units tablet Commonly known as:  VITAMIN D Take 1,000 Units by mouth daily.   cyclobenzaprine 10 MG tablet Commonly known as:  FLEXERIL Take 1 tablet (10 mg total) by mouth 3 (three) times daily as needed for muscle spasms.   Fish Oil 1200 MG Caps Take 1,200 mg by mouth.   glucose blood test strip Commonly known as:  ACCU-CHEK GUIDE Use to check BG once daily at varying times of day.   haloperidol 0.5 MG tablet Commonly known as:  HALDOL Take 0.5 mg by mouth at bedtime.   ibuprofen 200 MG tablet Commonly known as:  ADVIL,MOTRIN Take 800 mg by mouth every 8 (eight) hours as needed.   Lancets 30G Misc Dispense Accu-Chek guide lancets.  Check blood sugar at varying times once per day.   lisinopril 20 MG tablet Commonly known as:  PRINIVIL,ZESTRIL Take 1 tablet (20 mg total) by  mouth daily.   meloxicam 7.5 MG tablet Commonly known as:  MOBIC Take 2 tablets (15 mg total) by mouth daily.   metFORMIN 500 MG tablet Commonly known as:  GLUCOPHAGE Take 1 tablet (500 mg total) by mouth 2 (two) times daily with a meal.   Milk Thistle 1000 MG Caps Take 1 capsule by mouth daily.   OLANZapine 10 MG tablet Commonly known as:  ZYPREXA Take 10 mg by mouth at bedtime.   sertraline 100 MG tablet Commonly known as:  ZOLOFT Take 100 mg by mouth at bedtime.   traZODone 50 MG tablet Commonly known as:  DESYREL Take 50 mg by mouth at bedtime as needed for sleep.   Turmeric 500 MG Caps Take 1,500 mg by mouth.   Zinc 100 MG Tabs Take 50 mg by mouth daily.          Objective:    BP 134/86 (BP Location: Left Arm, Patient Position: Sitting, Cuff Size: Large)   Pulse 78   Temp 98.7 F (37.1 C) (Oral)   Ht  (1.702 m)   Wt (!) 318 lb 9.6 oz (144.5 kg)   LMP 03/11/2016 (Approximate)   BMI 49.90 kg/m   Wt Readings from Last 3 Encounters:  04/01/16 (!) 318 lb 9.6 oz (144.5 kg)  03/28/16 (!) 322 lb (146.1 kg)  03/01/16 (!) 311 lb 9.6 oz (141.3 kg)    Physical Exam  Constitutional: She is oriented to person, place, and time. She appears well-developed and well-nourished. No distress.  Eyes: Conjunctivae and EOM are normal. Pupils are equal, round, and reactive to light.  Cardiovascular: Normal rate, regular rhythm, normal heart sounds and intact distal pulses.   No murmur heard. Pulmonary/Chest: Effort normal and breath sounds normal. No respiratory distress. She has no wheezes.  Musculoskeletal: Normal range of motion. She exhibits tenderness (Low back pain, negative straight leg raise. No weakness noted on exam. Unable to elicit any other arthralgia on exam). She exhibits no edema.  Neurological: She is alert and oriented to person, place, and time. Coordination normal.  Skin: Skin is warm and dry. No rash noted. She is not diaphoretic.  Psychiatric: She  has a normal mood and affect. Her behavior is normal.  Nursing note and vitals reviewed.     Assessment & Plan:   Problem List Items Addressed This Visit      Cardiovascular and Mediastinum   Essential hypertension, benign     Endocrine   Type 2 diabetes mellitus (HCC) - Primary   Relevant Medications   Lancets 30G MISC   metFORMIN (GLUCOPHAGE) 500 MG tablet    Other Visit Diagnoses    Midline low back pain without sciatica       Relevant Medications   meloxicam (MOBIC) 7.5 MG tablet       Follow up plan: Return in about 3 months (around 07/02/2016), or if symptoms worsen or fail to improve, for Diabetes and hypertension recheck.  Counseling provided  for all of the vaccine components No orders of the defined types were placed in this encounter.   Arville Care, MD [facility] 04/01/2016, 10:38 AM

## 2016-04-08 ENCOUNTER — Encounter: Payer: Self-pay | Admitting: Family Medicine

## 2016-04-24 ENCOUNTER — Encounter: Payer: Self-pay | Admitting: Family Medicine

## 2016-05-15 ENCOUNTER — Ambulatory Visit: Payer: Medicaid Other | Admitting: Family Medicine

## 2016-06-04 ENCOUNTER — Encounter: Payer: Self-pay | Admitting: *Deleted

## 2016-07-01 ENCOUNTER — Encounter: Payer: Self-pay | Admitting: Pharmacist

## 2016-07-01 ENCOUNTER — Ambulatory Visit (INDEPENDENT_AMBULATORY_CARE_PROVIDER_SITE_OTHER): Payer: Medicaid Other | Admitting: Pharmacist

## 2016-07-01 VITALS — BP 114/70 | HR 60 | Ht 67.0 in | Wt 317.0 lb

## 2016-07-01 DIAGNOSIS — Z6841 Body Mass Index (BMI) 40.0 and over, adult: Secondary | ICD-10-CM | POA: Diagnosis not present

## 2016-07-01 DIAGNOSIS — IMO0001 Reserved for inherently not codable concepts without codable children: Secondary | ICD-10-CM

## 2016-07-01 DIAGNOSIS — E119 Type 2 diabetes mellitus without complications: Secondary | ICD-10-CM | POA: Diagnosis not present

## 2016-07-01 DIAGNOSIS — E6609 Other obesity due to excess calories: Secondary | ICD-10-CM

## 2016-07-01 LAB — BAYER DCA HB A1C WAIVED: HB A1C: 6.6 % (ref <7.0)

## 2016-07-01 NOTE — Patient Instructions (Signed)
Diabetes and Standards of Medical Care   Diabetes is complicated. You may find that your diabetes team includes a dietitian, nurse, diabetes educator, eye doctor, and more. To help everyone know what is going on and to help you get the care you deserve, the following schedule of care was developed to help keep you on track. Below are the tests, exams, vaccines, medicines, education, and plans you will need.  Blood Glucose Goals Prior to meals = 80 - 130 Within 2 hours of the start of a meal = less than 180  HbA1c test (goal is less than 6.5% - your last value was 6.6%) This test shows how well you have controlled your glucose over the past 2 to 3 months. It is used to see if your diabetes management plan needs to be adjusted.   It is performed at least 2 times a year if you are meeting treatment goals.  It is performed 4 times a year if therapy has changed or if you are not meeting treatment goals.  Blood pressure test  This test is performed at every routine medical visit. The goal is less than 140/90 mmHg for most people, but 130/80 mmHg in some cases. Ask your health care provider about your goal.  Dental exam  Follow up with the dentist regularly.  Eye exam  If you are diagnosed with type 1 diabetes as a child, get an exam upon reaching the age of 10 years or older and have had diabetes for 3 to 5 years. Yearly eye exams are recommended after that initial eye exam.  If you are diagnosed with type 1 diabetes as an adult, get an exam within 5 years of diagnosis and then yearly.  If you are diagnosed with type 2 diabetes, get an exam as soon as possible after the diagnosis and then yearly.  Foot care exam  Visual foot exams are performed at every routine medical visit. The exams check for cuts, injuries, or other problems with the feet.  A comprehensive foot exam should be done yearly. This includes visual inspection as well as assessing foot pulses and testing for loss of  sensation.  Check your feet nightly for cuts, injuries, or other problems with your feet. Tell your health care provider if anything is not healing.  Kidney function test (urine microalbumin)  This test is performed once a year.  Type 1 diabetes: The first test is performed 5 years after diagnosis.  Type 2 diabetes: The first test is performed at the time of diagnosis.  A serum creatinine and estimated glomerular filtration rate (eGFR) test is done once a year to assess the level of chronic kidney disease (CKD), if present.  Lipid profile (cholesterol, HDL, LDL, triglycerides)  Performed every 5 years for most people.  The goal for LDL is less than 100 mg/dL. If you are at high risk, the goal is less than 70 mg/dL.  The goal for HDL is 40 mg/dL to 50 mg/dL for men and 50 mg/dL to 60 mg/dL for women. An HDL cholesterol of 60 mg/dL or higher gives some protection against heart disease.  The goal for triglycerides is less than 150 mg/dL.  Influenza vaccine, pneumococcal vaccine, and hepatitis B vaccine  The influenza vaccine is recommended yearly.  The pneumococcal vaccine is generally given once in a lifetime. However, there are some instances when another vaccination is recommended. Check with your health care provider.  The hepatitis B vaccine is also recommended for adults with diabetes.    Diabetes self-management education  Education is recommended at diagnosis and ongoing as needed.  Treatment plan  Your treatment plan is reviewed at every medical visit.  Document Released: 06/16/2009 Document Revised: 04/21/2013 Document Reviewed: 01/19/2013 [employer] Patient Information 2014 [employer].   

## 2016-07-01 NOTE — Progress Notes (Signed)
Subjective:    Heidi [name] is a 25 y.o. female who presents for reevlauation type 2 DM.  She was first diagnosed about 4 months ago.   Last A1c = 6.6%.   7 days average = 167 (#2 readings) 90 day average = 178 (#5 readings)  Shanda BumpsJessica reports that her pain has improved with steroid injections and meloxicam.  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  Her scheduled varies between 4 to 9 hours a day. She really enjoys being able to work again.    Cardiovascular risk factors: hypertension, obesity (BMI >= 30 kg/m2), sedentary lifestyle and smoking/ tobacco exposure   Current Outpatient Prescriptions:  .  albuterol (PROVENTIL HFA;VENTOLIN HFA) 108 (90 Base) MCG/ACT inhaler, Inhale 2 puffs into the lungs every 6 (six) hours as needed for wheezing or shortness of breath., Disp: 1 Inhaler, Rfl: 0 .  amLODipine (NORVASC) 2.5 MG tablet, Take 1 tablet (2.5 mg total) by mouth daily., Disp: 90 tablet, Rfl: 3 .  cyclobenzaprine (FLEXERIL) 10 MG tablet, Take 1 tablet (10 mg total) by mouth 3 (three) times daily as needed for muscle spasms., Disp: 30 tablet, Rfl: 2 .  glucose blood (ACCU-CHEK GUIDE) test strip, Use to check BG once daily at varying times of day., Disp: 100 each, Rfl: 3 .  ibuprofen (ADVIL,MOTRIN) 200 MG tablet, Take 800 mg by mouth every 8 (eight) hours as needed., Disp: , Rfl:  .  Lancets 30G MISC, Dispense Accu-Chek guide lancets.  Check blood sugar at varying times once per day., Disp: 100 each, Rfl: 11 .  lisinopril (PRINIVIL,ZESTRIL) 20 MG tablet, Take 1 tablet (20 mg total) by mouth daily., Disp: 90 tablet, Rfl: 3 .  meloxicam (MOBIC) 7.5 MG tablet, Take 2 tablets (15 mg total) by mouth daily., Disp: 60 tablet, Rfl: 2 .  metFORMIN (GLUCOPHAGE) 500 MG tablet, Take 1 tablet (500 mg total) by mouth 2 (two) times daily with a meal., Disp: 180 tablet, Rfl: 1 .  sertraline (ZOLOFT) 100 MG tablet, Take 200 mg by mouth at bedtime. , Disp: , Rfl:  .  cholecalciferol (VITAMIN  D) 1000 units tablet, Take 1,000 Units by mouth daily., Disp: , Rfl:  .  Milk Thistle 1000 MG CAPS, Take 1 capsule by mouth daily. , Disp: , Rfl:  .  OLANZapine (ZYPREXA) 10 MG tablet, Take 10 mg by mouth at bedtime., Disp: , Rfl:  .  Omega-3 Fatty Acids (FISH OIL) 1200 MG CAPS, Take 1,200 mg by mouth., Disp: , Rfl:  .  traZODone (DESYREL) 50 MG tablet, Take 50 mg by mouth at bedtime as needed for sleep., Disp: , Rfl:  .  Turmeric 500 MG CAPS, Take 1,500 mg by mouth., Disp: , Rfl:  .  Zinc 100 MG TABS, Take 50 mg by mouth daily. , Disp: , Rfl:    Weight trend: decreased by 5# since last visit 03/28/16 Prior visit with CDE - yes Current diet: in general, a "healthy" diet  , follow CHO counting diet.  Patient drinks water or Powerade (60 calories per serving) Current exercise: none - due to pain / nerve pain  Is She on ACE inhibitor or angiotensin II receptor blocker?  Yes  lisinopril (Prinivil)    The following portions of the patient's history were reviewed and updated as appropriate: allergies, current medications, past family history, past medical history, past social history, past surgical history and problem list.  Objective:    BP 114/70   Pulse 60  Ht 5\' 7"  (1.702 m)   Wt (!) 317 lb (143.8 kg)   BMI 49.65 kg/m   A1c = 6.00% (11/29/2015) A1c = 6.6% (02/12/2016)   Lab Review Glucose (mg/dL)  Date Value  65/78/469601/24/2017 69   Glucose, Bld (mg/dL)  Date Value  29/52/841304/11/2015 108 (H)  05/16/2015 112 (H)  03/23/2015 125 (H)   CO2 (mmol/L)  Date Value  12/04/2015 27  09/26/2015 23  05/16/2015 26   BUN (mg/dL)  Date Value  24/40/102704/11/2015 8  09/26/2015 7  05/16/2015 9  03/23/2015 13   Creatinine, Ser (mg/dL)  Date Value  25/36/644004/11/2015 0.78  09/26/2015 0.74  05/16/2015 0.72    Assessment:   Type 2 DM - controlled Obesity - weight down 2# since 04/01/2016   Plan:    1.  Rx changes:   Continue metfromin XR 500mg  BID with food  Discussed other options for add ons -  GLP-1 or SGLT which would also help with weight 2.  Discussed BG goals - recommended she increase frequencyof checking BG to 1-2 times per week   3. Reviewed CHO / Calorie counting diet and ways to decreased calories in diet. 4.  Recommended increase physical activity - start with 10 or 20 minutes dialy and work up as able.  5. Follow up: PCP in 2 weeks and me in 6 months  Orders Placed This Encounter  Procedures  . Bayer DCA Hb A1c Waived  . Microalbumin / creatinine urine ratio     Patient ID: Heidi MyrtleJessica Tullis, female   DOB: 03/12/1991, 25 y.o.   MRN: [id number]

## 2016-07-02 LAB — MICROALBUMIN / CREATININE URINE RATIO
Creatinine, Urine: 127.7 mg/dL
MICROALB/CREAT RATIO: 25.2 mg/g{creat} (ref 0.0–30.0)
MICROALBUM., U, RANDOM: 32.2 ug/mL

## 2016-07-04 ENCOUNTER — Emergency Department (HOSPITAL_COMMUNITY): Payer: Medicaid Other

## 2016-07-04 ENCOUNTER — Encounter (HOSPITAL_COMMUNITY): Payer: Self-pay | Admitting: Emergency Medicine

## 2016-07-04 ENCOUNTER — Emergency Department (HOSPITAL_COMMUNITY)
Admission: EM | Admit: 2016-07-04 | Discharge: 2016-07-04 | Disposition: A | Discharge status: Home / Self Care | Payer: Medicaid Other | Attending: Emergency Medicine | Admitting: Emergency Medicine

## 2016-07-04 ENCOUNTER — Encounter: Payer: Self-pay | Admitting: Family Medicine

## 2016-07-04 DIAGNOSIS — F17210 Nicotine dependence, cigarettes, uncomplicated: Secondary | ICD-10-CM | POA: Diagnosis not present

## 2016-07-04 DIAGNOSIS — I10 Essential (primary) hypertension: Secondary | ICD-10-CM | POA: Diagnosis present

## 2016-07-04 DIAGNOSIS — F419 Anxiety disorder, unspecified: Secondary | ICD-10-CM

## 2016-07-04 DIAGNOSIS — M791 Myalgia: Secondary | ICD-10-CM | POA: Insufficient documentation

## 2016-07-04 DIAGNOSIS — Z79899 Other long term (current) drug therapy: Secondary | ICD-10-CM | POA: Diagnosis not present

## 2016-07-04 DIAGNOSIS — E119 Type 2 diabetes mellitus without complications: Secondary | ICD-10-CM | POA: Diagnosis not present

## 2016-07-04 DIAGNOSIS — Z791 Long term (current) use of non-steroidal anti-inflammatories (NSAID): Secondary | ICD-10-CM | POA: Diagnosis not present

## 2016-07-04 HISTORY — DX: Unspecified osteoarthritis, unspecified site: M19.90

## 2016-07-04 LAB — URINALYSIS, ROUTINE W REFLEX MICROSCOPIC
Bilirubin Urine: NEGATIVE
GLUCOSE, UA: NEGATIVE mg/dL
HGB URINE DIPSTICK: NEGATIVE
KETONES UR: NEGATIVE mg/dL
LEUKOCYTES UA: NEGATIVE
Nitrite: NEGATIVE
PH: 5.5 (ref 5.0–8.0)
Protein, ur: NEGATIVE mg/dL
Specific Gravity, Urine: 1.01 (ref 1.005–1.030)

## 2016-07-04 LAB — CBC WITH DIFFERENTIAL/PLATELET
Basophils Absolute: 0 10*3/uL (ref 0.0–0.1)
Basophils Relative: 0 %
EOS ABS: 0.2 10*3/uL (ref 0.0–0.7)
EOS PCT: 2 %
HCT: 38.4 % (ref 36.0–46.0)
Hemoglobin: 13 g/dL (ref 12.0–15.0)
LYMPHS ABS: 3.3 10*3/uL (ref 0.7–4.0)
LYMPHS PCT: 26 %
MCH: 31 pg (ref 26.0–34.0)
MCHC: 33.9 g/dL (ref 30.0–36.0)
MCV: 91.6 fL (ref 78.0–100.0)
MONO ABS: 0.6 10*3/uL (ref 0.1–1.0)
MONOS PCT: 4 %
Neutro Abs: 8.7 10*3/uL — ABNORMAL HIGH (ref 1.7–7.7)
Neutrophils Relative %: 68 %
PLATELETS: 251 10*3/uL (ref 150–400)
RBC: 4.19 MIL/uL (ref 3.87–5.11)
RDW: 13.1 % (ref 11.5–15.5)
WBC: 12.9 10*3/uL — ABNORMAL HIGH (ref 4.0–10.5)

## 2016-07-04 LAB — COMPREHENSIVE METABOLIC PANEL
ALBUMIN: 3.8 g/dL (ref 3.5–5.0)
ALT: 46 U/L (ref 14–54)
AST: 29 U/L (ref 15–41)
Alkaline Phosphatase: 98 U/L (ref 38–126)
Anion gap: 6 (ref 5–15)
BUN: 9 mg/dL (ref 6–20)
CHLORIDE: 104 mmol/L (ref 101–111)
CO2: 24 mmol/L (ref 22–32)
CREATININE: 0.78 mg/dL (ref 0.44–1.00)
Calcium: 8.7 mg/dL — ABNORMAL LOW (ref 8.9–10.3)
GFR calc Af Amer: >60 mL/min (ref >60)
GFR calc non Af Amer: >60 mL/min (ref >60)
GLUCOSE: 116 mg/dL — AB (ref 65–99)
POTASSIUM: 3.5 mmol/L (ref 3.5–5.1)
Sodium: 134 mmol/L — ABNORMAL LOW (ref 135–145)
Total Bilirubin: 0.2 mg/dL — ABNORMAL LOW (ref 0.3–1.2)
Total Protein: 6.6 g/dL (ref 6.5–8.1)

## 2016-07-04 LAB — PREGNANCY, URINE: Preg Test, Ur: NEGATIVE

## 2016-07-04 LAB — TROPONIN I: Troponin I: <0.03 ng/mL (ref <0.03)

## 2016-07-04 MED ORDER — LORAZEPAM 2 MG/ML IJ SOLN
1.0000 mg | Freq: Once | INTRAMUSCULAR | Status: AC
  Administered 2016-07-04: 1 mg via INTRAVENOUS
  Filled 2016-07-04: qty 1

## 2016-07-04 NOTE — ED Provider Notes (Signed)
[facility]-EMERG[facility]Y DEPT Provider Note   CSN: [id number] Arrival date & time: 07/04/16  2012  By signing my name below, I, Heidi [name], attest that this documentation has been prepared under the direction and in the presence of physician practitioner, Jacalyn LefevreJulie Irie Fiorello, MD. Electronically Signed: Linna Darnerussell [name], Scribe. 07/04/2016. 8:53 PM.  History   Chief Complaint Chief Complaint  Patient presents with  . Hypertension    The history is provided by the patient. No language interpreter was used.     HPI Comments: Heidi [name] is a 25 y.o. female who presents to the Emergency Department complaining of elevated blood pressure beginning about 2 hours ago. Pt reports she started having numbness/tingling in her face and neck 3 hours ago, and then drove to work. She states driving makes her anxious, and her numbness/tingling worsened en route to work which increased her anxiety. Pt reports she started having chest pain, SOB, and palpitations upon arrival to work at [employer]. She states she measured her blood pressure several times at the store and got readings around 170/110 each time. Pt reports she feels disoriented currently and is having hot flashes and chills. She notes pain in her left arm. Pt reports she uses Lisinopril for HTN daily and took her dosage today. She notes she has medication for anxiety but has not used it in a long time because she has not had an anxiety attack recently. She is a regular smoker. She denies abdominal pain, nausea, vomiting, weakness, syncope, or any other associated symptoms.  Past Medical History:  Diagnosis Date  . Abdominal pain 01/10/2016  . Anxiety   . Back pain   . Bloating 01/10/2016  . Depression   . Hematuria 01/10/2016  . Herpes simplex virus (HSV) infection   . Hypertension   . Neuropathy (HCC)    idopathic  . Osteoarthritis   . Prediabetes   . Schizo-affective schizophrenia (HCC)   . Type 2 diabetes mellitus (HCC) 03/28/2016  . Urinary  frequency 01/10/2016    Patient Active Problem List   Diagnosis Date Noted  . Type 2 diabetes mellitus (HCC) 03/28/2016  . Paresthesia 01/18/2016  . Pain in limb 01/18/2016  . Neuropathic pain 11/10/2015  . Essential hypertension, benign 07/21/2015  . Anxiety and depression 06/22/2015    Past Surgical History:  Procedure Laterality Date  . CESAREAN SECTION    . DILATION AND CURETTAGE OF UTERUS    . ESSURE TUBAL LIGATION    . TUBAL LIGATION      OB History    Gravida Para Term Preterm AB Living   2 1     1 1    SAB TAB Ectopic Multiple Live Births   1               Home Medications    Prior to Admission medications   Medication Sig Start Date End Date Taking? Authorizing Provider  albuterol (PROVENTIL HFA;VENTOLIN HFA) 108 (90 Base) MCG/ACT inhaler Inhale 2 puffs into the lungs every 6 (six) hours as needed for wheezing or shortness of breath. 02/12/16  Yes Elige RadonJoshua A Dettinger, MD  amLODipine (NORVASC) 2.5 MG tablet Take 1 tablet (2.5 mg total) by mouth daily. 03/01/16  Yes Elige RadonJoshua A Dettinger, MD  cyclobenzaprine (FLEXERIL) 10 MG tablet Take 1 tablet (10 mg total) by mouth 3 (three) times daily as needed for muscle spasms. 03/01/16  Yes Elige RadonJoshua A Dettinger, MD  ibuprofen (ADVIL,MOTRIN) 200 MG tablet Take 800 mg by mouth every 8 (eight) hours as needed.  Yes Historical Provider, MD  lisinopril (PRINIVIL,ZESTRIL) 20 MG tablet Take 1 tablet (20 mg total) by mouth daily. 11/10/15  Yes Elige Radon Dettinger, MD  meloxicam (MOBIC) 7.5 MG tablet Take 2 tablets (15 mg total) by mouth daily. 04/01/16  Yes Elige Radon Dettinger, MD  metFORMIN (GLUCOPHAGE) 500 MG tablet Take 1 tablet (500 mg total) by mouth 2 (two) times daily with a meal. 04/01/16  Yes Elige Radon Dettinger, MD  OLANZapine (ZYPREXA) 10 MG tablet Take 10 mg by mouth at bedtime.   Yes Historical Provider, MD  sertraline (ZOLOFT) 100 MG tablet Take 200 mg by mouth at bedtime.    Yes Historical Provider, MD  traZODone (DESYREL) 50 MG  tablet Take 50 mg by mouth at bedtime as needed for sleep.   Yes Historical Provider, MD  glucose blood (ACCU-CHEK GUIDE) test strip Use to check BG once daily at varying times of day. 03/28/16   Henrene Pastor, PharmD  Lancets 30G MISC Dispense Accu-Chek guide lancets.  Check blood sugar at varying times once per day. 04/01/16   Elige Radon Dettinger, MD    Family History Family History  Problem Relation Age of Onset  . Depression Mother   . Hypertension Mother   . Diabetes Mother   . Mental illness Father   . Hypertension Father   . Diabetes Father   . Cancer Maternal Aunt   . Diabetes Paternal Grandmother   . Hypertension Paternal Grandmother   . Arthritis Paternal Grandmother   . Stroke Paternal Grandfather   . Heart disease Paternal Grandfather   . Hypertension Paternal Grandfather   . Diabetes Paternal Grandfather   . Schizophrenia Maternal Grandfather   . Alcohol abuse Maternal Grandfather   . Other Sister     recovering addict  . Schizophrenia Sister   . Irritable bowel syndrome Sister   . Other Sister     recovering addict  . Bipolar disorder Sister   . Seizures Sister     Social History Social History  Substance Use Topics  . Smoking status: Current Every Day Smoker    Packs/day: 1.00    Years: 10.00    Types: Cigarettes  . Smokeless tobacco: Never Used  . Alcohol use 0.0 oz/week     Comment: Occasionally      Allergies   Lorazepam; Morphine and related; and Quetiapine   Review of Systems Review of Systems  Constitutional: Positive for chills.  Respiratory: Positive for shortness of breath.   Cardiovascular: Positive for chest pain and palpitations.  Gastrointestinal: Negative for abdominal pain, nausea and vomiting.  Musculoskeletal: Positive for myalgias (left arm).  Neurological: Positive for numbness. Negative for syncope and weakness.  Psychiatric/Behavioral: The patient is nervous/anxious.   All other systems reviewed and are  negative.   Physical Exam Updated Vital Signs BP 144/86 (BP Location: Left Arm)   Pulse 93   Temp 98.1 F (36.7 C) (Oral)   Resp 18   Ht 5\' 7"  (1.702 m)   Wt (!) 319 lb (144.7 kg)   LMP 06/28/2016   SpO2 100%   BMI 49.96 kg/m   Physical Exam  Constitutional: She is oriented to person, place, and time. She appears well-developed and well-nourished. No distress.  HENT:  Head: Normocephalic and atraumatic.  Eyes: Conjunctivae and EOM are normal.  Neck: Neck supple. No tracheal deviation present.  Cardiovascular: Normal rate.   Pulmonary/Chest: Effort normal. No respiratory distress.  Musculoskeletal: Normal range of motion.  Neurological: She is alert and oriented to  person, place, and time.  Skin: Skin is warm and dry.  Psychiatric: She has a normal mood and affect. Her behavior is normal.  Nursing note and vitals reviewed.    ED Treatments / Results  Labs (all labs ordered are listed, but only abnormal results are displayed) Labs Reviewed - No data to display  EKG  EKG Interpretation None       Radiology No results found.  Procedures Procedures (including critical care time)  DIAGNOSTIC STUDIES: Oxygen Saturation is 100% on RA, normal by my interpretation.    COORDINATION OF CARE: 9:00 PM Discussed treatment plan with pt at bedside and pt agreed to plan.  Medications Ordered in ED Medications - No data to display   Initial Impression / Assessment and Plan / ED Course  I have reviewed the triage vital signs and the nursing notes.  Pertinent labs & imaging results that were available during my care of the patient were reviewed by me and considered in my medical decision making (see chart for details).  Clinical Course   Pt looks much better after ativan.  Her BP is normal here.  I suspect it was elevated with anxiety.  She knows to return if worse and to f/u with her pcp.   I personally performed the services described in this documentation, which  was scribed in my presence. The recorded information has been reviewed and is accurate.   Final Clinical Impressions(s) / ED Diagnoses   Final diagnoses:  None    New Prescriptions New Prescriptions   No medications on file     Jacalyn LefevreJulie Finnleigh Marchetti, MD 07/04/16 2230

## 2016-07-04 NOTE — ED Triage Notes (Signed)
Pt reports hypertension today, pt states she felt pain in her L shoulder and chest. Pt also reports disorientation and lethargy. Pt states she is having difficulty "formulating words." Pt speech clear in Triage, pt ambulatory, moving all extremities.

## 2016-07-05 ENCOUNTER — Other Ambulatory Visit: Payer: Self-pay | Admitting: Family

## 2016-07-05 DIAGNOSIS — M545 Low back pain: Principal | ICD-10-CM

## 2016-07-05 DIAGNOSIS — G8929 Other chronic pain: Secondary | ICD-10-CM

## 2016-07-05 MED ORDER — MELOXICAM 7.5 MG PO TABS: 15.0000 mg | ORAL_TABLET | Freq: Every day | ORAL | 2 refills | Status: DC

## 2016-07-07 ENCOUNTER — Encounter: Payer: Self-pay | Admitting: Pharmacist

## 2016-07-15 ENCOUNTER — Ambulatory Visit: Payer: Medicaid Other | Admitting: Family Medicine

## 2016-07-20 ENCOUNTER — Encounter: Payer: Self-pay | Admitting: Family Medicine

## 2016-07-27 ENCOUNTER — Encounter: Payer: Self-pay | Admitting: Family Medicine

## 2016-08-02 ENCOUNTER — Encounter: Payer: Self-pay | Admitting: Family Medicine

## 2016-08-02 MED ORDER — CYCLOBENZAPRINE HCL 10 MG PO TABS: 10.0000 mg | ORAL_TABLET | Freq: Three times a day (TID) | ORAL | 2 refills | Status: DC | PRN

## 2016-08-08 ENCOUNTER — Encounter: Payer: Self-pay | Admitting: Family Medicine

## 2016-08-08 ENCOUNTER — Ambulatory Visit (INDEPENDENT_AMBULATORY_CARE_PROVIDER_SITE_OTHER): Payer: Medicaid Other | Admitting: Family Medicine

## 2016-08-08 VITALS — BP 133/82 | HR 74 | Temp 98.3°F | Ht 67.0 in | Wt 316.0 lb

## 2016-08-08 DIAGNOSIS — I10 Essential (primary) hypertension: Secondary | ICD-10-CM | POA: Diagnosis not present

## 2016-08-08 DIAGNOSIS — E119 Type 2 diabetes mellitus without complications: Secondary | ICD-10-CM | POA: Diagnosis not present

## 2016-08-08 DIAGNOSIS — L2084 Intrinsic (allergic) eczema: Secondary | ICD-10-CM

## 2016-08-08 MED ORDER — TRIAMCINOLONE ACETONIDE 0.1 % EX CREA: 1.0000 "application " | TOPICAL_CREAM | Freq: Two times a day (BID) | CUTANEOUS | 0 refills | Status: DC

## 2016-08-08 MED ORDER — AMLODIPINE BESYLATE 5 MG PO TABS: 2.5000 mg | ORAL_TABLET | Freq: Every day | ORAL | 1 refills | Status: DC

## 2016-08-08 NOTE — Progress Notes (Signed)
BP 133/82   Pulse 74   Temp 98.3 F (36.8 C) (Oral)   Ht 5\' 7"  (1.702 m)   Wt (!) 316 lb (143.3 kg)   BMI 49.49 kg/m    Subjective:    Patient ID: Heidi [name], female    DOB: 03/10/1991, 25 y.o.   MRN: [id number]  HPI: Heidi [name] is a 25 y.o. female presenting on 08/08/2016 for Lesion on left hip; Hypertension (reports  BP has been elevated, 147/95, 150/94 recently); and Hypoglycemic episodes (reports BS has been dropping recently, has not taken Metformin in 5 days)   HPI Hypertension recheck Patient comes in today for hypertension recheck. She says at home that her blood pressure has been running consistently in the 145/90 and 150 over 90s. She says she's been checking it twice a day at 2 PM and then 7 PM. She is currently on low-dose amlodipine and lisinopril. Patient denies headaches, blurred vision, chest pains, shortness of breath, or weakness. Denies any side effects from medication and is content with current medication.   Type 2 diabetes Patient says that she has been getting low blood sugars with her diabetes. She has been on metformin 500 twice a day. She says she has gotten down to 99 on her blood sugar check and she felt jittery and shaky and had to eat something with that. She has stopped her metformin for the past 4 days because of it. She's never had a blood sugars lower than 99 and off of it she has had blood sugars up to the 170s. She also felt dizzy when it was low in the 99's.  Rash on left lateral hip Patient has a dry scaly rash on her left lateral hip that's been coming back every month with her menstrual cycles she feels like. She has used some peroxide on it which has not made it better or worse. She says it will eventually go away but then it comes back. She denies any redness or drainage or warmth associated with it. She denies any fevers or chills. She denies any similar spots anywhere else. The spot is very pruritic.  Relevant past medical, surgical,  family and social history reviewed and updated as indicated. Interim medical history since our last visit reviewed. Allergies and medications reviewed and updated.  Review of Systems  Constitutional: Negative for chills and fever.  HENT: Negative for congestion, ear discharge and ear pain.   Eyes: Negative for redness and visual disturbance.  Respiratory: Negative for chest tightness and shortness of breath.   Cardiovascular: Negative for chest pain and leg swelling.  Genitourinary: Negative for difficulty urinating and dysuria.  Musculoskeletal: Negative for arthralgias, back pain and gait problem.  Skin: Positive for rash. Negative for color change.  Neurological: Positive for dizziness and tremors. Negative for weakness, light-headedness, numbness and headaches.  Psychiatric/Behavioral: Negative for agitation and behavioral problems.  All other systems reviewed and are negative.   Per HPI unless specifically indicated above     Objective:    BP 133/82   Pulse 74   Temp 98.3 F (36.8 C) (Oral)   Ht 5\' 7"  (1.702 m)   Wt (!) 316 lb (143.3 kg)   BMI 49.49 kg/m   Wt Readings from Last 3 Encounters:  08/08/16 (!) 316 lb (143.3 kg)  07/04/16 (!) 319 lb (144.7 kg)  07/01/16 (!) 317 lb (143.8 kg)    Physical Exam  Constitutional: She is oriented to person, place, and time. She appears well-developed and well-nourished.  No distress.  Eyes: Conjunctivae are normal.  Cardiovascular: Normal rate, regular rhythm, normal heart sounds and intact distal pulses.   No murmur heard. Pulmonary/Chest: Effort normal and breath sounds normal. No respiratory distress. She has no wheezes. She has no rales.  Musculoskeletal: Normal range of motion. She exhibits no edema or tenderness.  Neurological: She is alert and oriented to person, place, and time. Coordination normal.  Skin: Skin is warm and dry. Rash noted. Rash is maculopapular (Maculopapular with excoriation and scaliness. Small patch  about 4 cm in diameter on left lateral hip). She is not diaphoretic.  Psychiatric: She has a normal mood and affect. Her behavior is normal.  Nursing note and vitals reviewed.     Assessment & Plan:   Problem List Items Addressed This Visit      Cardiovascular and Mediastinum   Essential hypertension, benign   Relevant Medications   amLODipine (NORVASC) 5 MG tablet     Endocrine   Type 2 diabetes mellitus (HCC) - Primary    Reassurance that 90s are normal, her body will get use to the lower blood sugar and not have a reaction.       Other Visit Diagnoses    Intrinsic eczema       Small patch on left hip, will send triamcinolone.       Follow up plan: Return in about 2 months (around 10/09/2016), or if symptoms worsen or fail to improve, for Follow-up diabetes and hypertension.  Counseling provided for all of the vaccine components No orders of the defined types were placed in this encounter.   Arville CareJoshua Bryna Razavi, MD [facility] 08/08/2016, 10:37 AM

## 2016-08-08 NOTE — Assessment & Plan Note (Signed)
Reassurance that 90s are normal, her body will get use to the lower blood sugar and not have a reaction.

## 2016-08-22 ENCOUNTER — Encounter: Payer: Self-pay | Admitting: Family Medicine

## 2016-08-22 ENCOUNTER — Ambulatory Visit: Payer: Medicaid Other | Admitting: Family Medicine

## 2016-08-22 ENCOUNTER — Ambulatory Visit (INDEPENDENT_AMBULATORY_CARE_PROVIDER_SITE_OTHER): Payer: Medicaid Other | Admitting: Family Medicine

## 2016-08-22 VITALS — BP 138/88 | HR 102 | Temp 99.5°F | Ht 67.0 in | Wt 310.4 lb

## 2016-08-22 DIAGNOSIS — Z23 Encounter for immunization: Secondary | ICD-10-CM

## 2016-08-22 DIAGNOSIS — M25561 Pain in right knee: Secondary | ICD-10-CM | POA: Diagnosis not present

## 2016-08-22 DIAGNOSIS — G8929 Other chronic pain: Secondary | ICD-10-CM | POA: Diagnosis not present

## 2016-08-22 DIAGNOSIS — M25562 Pain in left knee: Secondary | ICD-10-CM

## 2016-08-22 DIAGNOSIS — F419 Anxiety disorder, unspecified: Secondary | ICD-10-CM

## 2016-08-22 DIAGNOSIS — F329 Major depressive disorder, single episode, unspecified: Secondary | ICD-10-CM

## 2016-08-22 MED ORDER — HYDROXYZINE HCL 50 MG PO TABS: 50.0000 mg | ORAL_TABLET | Freq: Three times a day (TID) | ORAL | 2 refills | Status: DC | PRN

## 2016-08-22 MED ORDER — METHYLPREDNISOLONE ACETATE 80 MG/ML IJ SUSP
80.0000 mg | Freq: Once | INTRAMUSCULAR | Status: AC
  Administered 2016-08-22: 80 mg via INTRAMUSCULAR

## 2016-08-22 NOTE — Progress Notes (Signed)
BP 138/88   Pulse (!) 102   Temp 99.5 F (37.5 C) (Oral)   Ht 5\' 7"  (1.702 m)   Wt (!) 310 lb 6 oz (140.8 kg)   BMI 48.61 kg/m    Subjective:    Patient ID: Heidi [name], female    DOB: 09/04/1990, 25 y.o.   MRN: [id number]  HPI: Heidi [name] is a 25 y.o. female presenting on 08/22/2016 for Bilateral knee pain   HPI Bilateral knee pains Patient has been seen a rheumatologist and had injections for bilateral knee pain previously and is coming in today because it's been at least 4 months and would like he needs injections again. She has been told that it is related to rheumatic illness and her weight and osteoarthritis and just to manage it with the injections as we can. She says the pain is an 8 out of 10 in both knees but worse in the left than the right. She gets a lot of grinding her right knee as well.  Anxiety and depression and panic attacks Patient has been having anxiety and depression and panic attacks for quite some time and has seen a psychiatrist and would like to transfer her care to here for that because she has been stable on the medications. She also would like to know if she can get something for the panic attacks that she can use on an as needed basis. She denies any suicidal ideations or thoughts of hurting herself. The panic attacks and been happening about 2-3 times per week.  Relevant past medical, surgical, family and social history reviewed and updated as indicated. Interim medical history since our last visit reviewed. Allergies and medications reviewed and updated.  Review of Systems  Constitutional: Negative for chills and fever.  Respiratory: Negative for cough, chest tightness and shortness of breath.   Cardiovascular: Negative for chest pain and leg swelling.  Genitourinary: Negative for difficulty urinating and dysuria.  Musculoskeletal: Positive for arthralgias. Negative for back pain, gait problem and joint swelling.  Skin: Negative for color  change and rash.  Neurological: Negative for light-headedness and headaches.  Psychiatric/Behavioral: Negative for agitation, behavioral problems, self-injury, sleep disturbance and suicidal ideas. The patient is nervous/anxious.   All other systems reviewed and are negative.   Per HPI unless specifically indicated above     Objective:    BP 138/88   Pulse (!) 102   Temp 99.5 F (37.5 C) (Oral)   Ht 5\' 7"  (1.702 m)   Wt (!) 310 lb 6 oz (140.8 kg)   BMI 48.61 kg/m   Wt Readings from Last 3 Encounters:  08/22/16 (!) 310 lb 6 oz (140.8 kg)  08/08/16 (!) 316 lb (143.3 kg)  07/04/16 (!) 319 lb (144.7 kg)    Physical Exam  Constitutional: She is oriented to person, place, and time. She appears well-developed and well-nourished. No distress.  Eyes: Conjunctivae are normal.  Cardiovascular: Normal rate, regular rhythm, normal heart sounds and intact distal pulses.   No murmur heard. Pulmonary/Chest: Effort normal and breath sounds normal. No respiratory distress. She has no wheezes. She has no rales.  Musculoskeletal: Normal range of motion. She exhibits tenderness (Diffuse pain in anterior bilateral knees, no erythema or warmth, range of motion intact and strength intact). She exhibits no edema.  Neurological: She is alert and oriented to person, place, and time. Coordination normal.  Skin: Skin is warm and dry. No rash noted. She is not diaphoretic.  Psychiatric: She has a normal  mood and affect. Her behavior is normal.  Nursing note and vitals reviewed.   Bilateral Knee injections: Risk factors of bleeding and infection discussed with patient and patient is agreeable towards injection. Patient prepped with Betadine. Lateral approach towards injection used. Injected 80 mg of Depo-Medrol and 1 mL of 2% lidocaine into each knee. Patient tolerated procedure well and no side effects from noted. Minimal to no bleeding. Simple bandage applied after.     Assessment & Plan:   Problem List  Items Addressed This Visit      Other   Anxiety and depression   Relevant Medications   hydrOXYzine (ATARAX/VISTARIL) 50 MG tablet    Other Visit Diagnoses    Chronic pain of both knees    -  Primary   Likely related to arthritis and rheumatic illnesses, has seen rheumatologist before and had injections, repeat injections   Relevant Medications   methylPREDNISolone acetate (DEPO-MEDROL) injection 80 mg (Start on 08/22/2016  5:00 PM)   methylPREDNISolone acetate (DEPO-MEDROL) injection 80 mg (Completed) (Start on 08/22/2016  5:00 PM)   Encounter for immunization       Relevant Orders   Flu Vaccine QUAD 36+ mos IM (Completed)       Follow up plan: Return if symptoms worsen or fail to improve.  Counseling provided for all of the vaccine components Orders Placed This Encounter  Procedures  . Flu Vaccine QUAD 36+ mos IM    Arville CareJoshua Dianne Whelchel, MD [facility] 08/22/2016, 4:50 PM

## 2016-08-27 ENCOUNTER — Encounter: Payer: Self-pay | Admitting: Family Medicine

## 2016-08-27 ENCOUNTER — Telehealth: Payer: Self-pay | Admitting: *Deleted

## 2016-08-27 NOTE — Telephone Encounter (Signed)
From pt:  If I'm sick, just a uri, i believe. Do i need to come in, or can [facility] send me some antibiotics?   Mainly my throat is sore, I'm coughing some, bad headaches, achey, sinus pressure, etc. Nothing i feel is too bad yet, just annoying.  Oswaldo DoneVincent is coverage

## 2016-08-27 NOTE — Telephone Encounter (Signed)
Would need to be seen for antibiotics. Most URIs are viral and we don't treat with antibiotics. Can try  Netipot or similar for sinus rinses. with distilled water/salt water solution 2-3 times a day to clear out sinuses Flonase steroid nasal spray Antihistamine such as cetirizine or zyrtec daily Viral URIs can last 3-14 days, cough can last longer. If starts getting worse should be seen, if breathing getting worse needs to be seen.

## 2016-09-13 ENCOUNTER — Ambulatory Visit (INDEPENDENT_AMBULATORY_CARE_PROVIDER_SITE_OTHER): Payer: Medicaid Other | Admitting: Family

## 2016-09-13 ENCOUNTER — Encounter: Payer: Self-pay | Admitting: Family

## 2016-09-13 VITALS — BP 127/70 | HR 86 | Temp 97.7°F | Ht 67.0 in | Wt 305.0 lb

## 2016-09-13 DIAGNOSIS — G5602 Carpal tunnel syndrome, left upper limb: Secondary | ICD-10-CM | POA: Diagnosis not present

## 2016-09-13 MED ORDER — PREDNISONE 10 MG (21) PO TBPK: ORAL_TABLET | ORAL | 0 refills | Status: DC

## 2016-09-13 NOTE — Patient Instructions (Signed)
Carpal Tunnel Syndrome Carpal tunnel syndrome is a condition that causes pain in your hand and arm. The carpal tunnel is a narrow area located on the palm side of your wrist. Repeated wrist motion or certain diseases may cause swelling within the tunnel. This swelling pinches the main nerve in the wrist (median nerve). What are the causes? This condition may be caused by:  Repeated wrist motions.  Wrist injuries.  Arthritis.  A cyst or tumor in the carpal tunnel.  Fluid buildup during pregnancy.  Sometimes the cause of this condition is not known. What increases the risk? This condition is more likely to develop in:  People who have jobs that cause them to repeatedly move their wrists in the same motion, such as butchers and cashiers.  Women.  People with certain conditions, such as: ? Diabetes. ? Obesity. ? An underactive thyroid (hypothyroidism). ? Kidney failure.  What are the signs or symptoms? Symptoms of this condition include:  A tingling feeling in your fingers, especially in your thumb, index, and middle fingers.  Tingling or numbness in your hand.  An aching feeling in your entire arm, especially when your wrist and elbow are bent for long periods of time.  Wrist pain that goes up your arm to your shoulder.  Pain that goes down into your palm or fingers.  A weak feeling in your hands. You may have trouble grabbing and holding items.  Your symptoms may feel worse during the night. How is this diagnosed? This condition is diagnosed with a medical history and physical exam. You may also have tests, including:  An electromyogram (EMG). This test measures electrical signals sent by your nerves into the muscles.  X-rays.  How is this treated? Treatment for this condition includes:  Lifestyle changes. It is important to stop doing or modify the activity that caused your condition.  Physical or occupational therapy.  Medicines for pain and inflammation.  This may include medicine that is injected into your wrist.  A wrist splint.  Surgery.  Follow these instructions at home: If you have a splint:  Wear it as told by your health care provider. Remove it only as told by your health care provider.  Loosen the splint if your fingers become numb and tingle, or if they turn cold and blue.  Keep the splint clean and dry. General instructions  Take over-the-counter and prescription medicines only as told by your health care provider.  Rest your wrist from any activity that may be causing your pain. If your condition is work related, talk to your employer about changes that can be made, such as getting a wrist pad to use while typing.  If directed, apply ice to the painful area: ? Put ice in a plastic bag. ? Place a towel between your skin and the bag. ? Leave the ice on for 20 minutes, 2-3 times per day.  Keep all follow-up visits as told by your health care provider. This is important.  Do any exercises as told by your health care provider, physical therapist, or occupational therapist. Contact a health care provider if:  You have new symptoms.  Your pain is not controlled with medicines.  Your symptoms get worse. This information is not intended to replace advice given to you by your health care provider. Make sure you discuss any questions you have with your health care provider. Document Released: 08/16/2000 Document Revised: 12/28/2015 Document Reviewed: 01/04/2015 Elsevier Interactive Patient Education  2017 [employer]  

## 2016-09-13 NOTE — Progress Notes (Signed)
   Subjective:    Patient ID: Heidi [name], female    DOB: 10/31/1990, 26 y.o.   MRN: [id number]  Wrist Pain   The pain is present in the left wrist. This is a recurrent problem. The current episode started more than 1 year ago. There has been no history of extremity trauma. The problem occurs intermittently. The problem has been gradually worsening. The quality of the pain is described as aching. The pain is at a severity of 9/10. The pain is moderate. Associated symptoms include numbness and tingling. The symptoms are aggravated by lying down. She has tried acetaminophen and rest for the symptoms. The treatment provided mild relief.      Review of Systems  Neurological: Positive for tingling and numbness.  All other systems reviewed and are negative.      Objective:   Physical Exam  Constitutional: She is oriented to person, place, and time. She appears well-developed and well-nourished. No distress.  Eyes: Pupils are equal, round, and reactive to light.  Neck: Normal range of motion. Neck supple. No thyromegaly present.  Cardiovascular: Normal rate, regular rhythm, normal heart sounds and intact distal pulses.   No murmur heard. Pulmonary/Chest: Effort normal and breath sounds normal. No respiratory distress. She has no wheezes.  Abdominal: Soft. Bowel sounds are normal. She exhibits no distension. There is no tenderness.  Musculoskeletal: Normal range of motion. She exhibits no edema or tenderness.  Positive tinel and phalen  Neurological: She is alert and oriented to person, place, and time.  Skin: Skin is warm and dry.  Psychiatric: She has a normal mood and affect. Her behavior is normal. Judgment and thought content normal.  Vitals reviewed.   BP 127/70   Pulse 86   Temp 97.7 F (36.5 C) (Oral)   Ht 5\' 7"  (1.702 m)   Wt (!) 305 lb (138.3 kg)   BMI 47.77 kg/m        Assessment & Plan:  1. Carpal tunnel syndrome of left wrist -Wear wrist splint every night and  while working -Continue Mobic -Rest -Ice -Low carb diet since starting prednisone  - predniSONE (STERAPRED UNI-PAK 21 TAB) 10 MG (21) TBPK tablet; Use as directed  Dispense: 21 tablet; Refill: 0   Heidi Rodneyhristy Zan Triska, FNP

## 2016-10-03 ENCOUNTER — Ambulatory Visit (INDEPENDENT_AMBULATORY_CARE_PROVIDER_SITE_OTHER): Payer: Medicaid Other | Admitting: Nurse Practitioner

## 2016-10-03 ENCOUNTER — Encounter: Payer: Self-pay | Admitting: Nurse Practitioner

## 2016-10-03 VITALS — BP 135/88 | HR 78 | Temp 97.1°F | Ht 67.0 in | Wt 307.0 lb

## 2016-10-03 DIAGNOSIS — J069 Acute upper respiratory infection, unspecified: Secondary | ICD-10-CM

## 2016-10-03 DIAGNOSIS — N926 Irregular menstruation, unspecified: Secondary | ICD-10-CM | POA: Diagnosis not present

## 2016-10-03 DIAGNOSIS — B9789 Other viral agents as the cause of diseases classified elsewhere: Secondary | ICD-10-CM

## 2016-10-03 DIAGNOSIS — R52 Pain, unspecified: Secondary | ICD-10-CM

## 2016-10-03 LAB — PREGNANCY, URINE: Preg Test, Ur: NEGATIVE

## 2016-10-03 LAB — VERITOR FLU A/B WAIVED
INFLUENZA A: NEGATIVE
INFLUENZA B: NEGATIVE

## 2016-10-03 NOTE — Progress Notes (Signed)
   Subjective:    Patient ID: Heidi [name], female    DOB: 01/02/1991, 26 y.o.   MRN: [id number]  HPI Parient comes in today c/o cough that started a week ago. Developed chills and body aches on Tuesday. Not sure if has a fever or not. She alos wants to make sure she is  Not pregnant because her period is 3 day slate and she had esure procedure and worries about ectopic pregnancy.  Review of Systems  Constitutional: Positive for chills, fatigue and fever (?).  HENT: Positive for congestion and rhinorrhea. Negative for ear pain, sneezing, sore throat, trouble swallowing and voice change.   Respiratory: Positive for cough. Negative for shortness of breath.   Cardiovascular: Negative.   Gastrointestinal: Negative.   Genitourinary: Negative.   Neurological: Negative.   Psychiatric/Behavioral: Negative.   All other systems reviewed and are negative.      Objective:   Physical Exam  Constitutional: She is oriented to person, place, and time. She appears well-developed and well-nourished. No distress.  HENT:  Right Ear: Hearing, tympanic membrane, external ear and ear canal normal.  Left Ear: Hearing, tympanic membrane, external ear and ear canal normal.  Nose: Mucosal edema and rhinorrhea present. Right sinus exhibits no maxillary sinus tenderness and no frontal sinus tenderness. Left sinus exhibits no maxillary sinus tenderness and no frontal sinus tenderness.  Mouth/Throat: Uvula is midline, oropharynx is clear and moist and mucous membranes are normal.  Cardiovascular: Normal rate and regular rhythm.   Pulmonary/Chest: Effort normal and breath sounds normal.  Neurological: She is alert and oriented to person, place, and time.  Skin: Skin is warm.  Psychiatric: She has a normal mood and affect. Her behavior is normal. Judgment and thought content normal.    BP 135/88   Pulse 78   Temp 97.1 F (36.2 C) (Oral)   Ht 5\' 7"  (1.702 m)   Wt (!) 307 lb (139.3 kg)   BMI 48.08 kg/m   Flu  negative Urine pregnancy negative      Assessment & Plan:  1. Body aches - Veritor Flu A/B Waived  2. Viral upper respiratory tract infection with cough 1. Take meds as prescribed 2. Use a cool mist humidifier especially during the winter months and when heat has been humid. 3. Use saline nose sprays frequently 4. Saline irrigations of the nose can be very helpful if done frequently.  * 4X daily for 1 week*  * Use of a nettie pot can be helpful with this. Follow directions with this* 5. Drink plenty of fluids 6. Keep thermostat turn down low 7.For any cough or congestion  Use plain Mucinex- regular strength or max strength is fine   * Children- consult with Pharmacist for dosing 8. For fever or aces or pains- take tylenol or ibuprofen appropriate for age and weight.  * for fevers greater than 101 orally you may alternate ibuprofen and tylenol every  3 hours.     3. Late menses If menses does not start see OB/GYN - Pregnancy, urine

## 2016-10-03 NOTE — Patient Instructions (Signed)

## 2016-10-10 ENCOUNTER — Encounter: Payer: Self-pay | Admitting: Family Medicine

## 2016-10-23 ENCOUNTER — Encounter: Payer: Self-pay | Admitting: Family Medicine

## 2016-10-23 ENCOUNTER — Ambulatory Visit (INDEPENDENT_AMBULATORY_CARE_PROVIDER_SITE_OTHER): Payer: Medicaid Other | Admitting: Family Medicine

## 2016-10-23 VITALS — BP 125/82 | HR 85 | Temp 97.2°F | Ht 67.0 in | Wt 299.8 lb

## 2016-10-23 DIAGNOSIS — F329 Major depressive disorder, single episode, unspecified: Secondary | ICD-10-CM

## 2016-10-23 DIAGNOSIS — K2100 Gastro-esophageal reflux disease with esophagitis, without bleeding: Secondary | ICD-10-CM

## 2016-10-23 DIAGNOSIS — F418 Other specified anxiety disorders: Secondary | ICD-10-CM

## 2016-10-23 DIAGNOSIS — F419 Anxiety disorder, unspecified: Principal | ICD-10-CM

## 2016-10-23 MED ORDER — OLANZAPINE 10 MG PO TABS: 10.0000 mg | ORAL_TABLET | Freq: Every day | ORAL | 1 refills | Status: DC

## 2016-10-23 MED ORDER — ALPRAZOLAM 0.25 MG PO TABS: 0.2500 mg | ORAL_TABLET | Freq: Two times a day (BID) | ORAL | 1 refills | Status: DC | PRN

## 2016-10-23 MED ORDER — RANITIDINE HCL 150 MG PO TABS: 150.0000 mg | ORAL_TABLET | Freq: Two times a day (BID) | ORAL | 2 refills | Status: DC

## 2016-10-23 MED ORDER — SERTRALINE HCL 100 MG PO TABS: 200.0000 mg | ORAL_TABLET | Freq: Every day | ORAL | 2 refills | Status: DC

## 2016-10-23 NOTE — Progress Notes (Signed)
BP 125/82   Pulse 85   Temp 97.2 F (36.2 C) (Oral)   Ht 5\' 7"  (1.702 m)   Wt 299 lb 12.8 oz (136 kg)   BMI 46.96 kg/m    Subjective:    Patient ID: Heidi [name], female    DOB: May 06, 1991, 26 y.o.   MRN: [id number]  HPI: Heidi [name] is a 26 y.o. female presenting on 10/23/2016 for   HPI Anxiety and depression Patient would like to get some refills from [place] for her anxiety and depression medications. She typically sees psychiatry but has not been able to get out there and she needs a refill on her medications before she runs out. She is currently taking Zyprexa and Zoloft. She denies any issues with these medication. She denies any suicidal ideations or thoughts of hurting herself. She says the major issues that she's been having recently is with panic attacks and anxiety attacks that she gets at different times about once per week. She denies any specific triggers for them that she can think of but just that her anxiety will get worked up to the point where she feels like she is having a panic attack. Previously she has been on benzodiazepines for this and feels like the Atarax or hydroxyzine is not working. She says it is not frequent enough that she would need a lot of benzodiazepines would like to try to just get a few for the times when it's really bad.  Abdominal pain Patient is having epigastric abdominal pain that has been more prominent than she's ever had before. She does feel the burning going up into her chest and into her throat that is more that she has had previously. She denies any blood in her stool or any black tarry stools. She denies any pain radiating anywhere else..  Relevant past medical, surgical, family and social history reviewed and updated as indicated. Interim medical history since our last visit reviewed. Allergies and medications reviewed and updated.  Review of Systems  Constitutional: Negative for chills and fever.  HENT: Negative for congestion, ear  discharge and ear pain.   Eyes: Negative for redness and visual disturbance.  Respiratory: Negative for chest tightness and shortness of breath.   Cardiovascular: Negative for chest pain and leg swelling.  Gastrointestinal: Positive for abdominal pain. Negative for abdominal distention, anal bleeding, blood in stool, constipation, diarrhea, nausea and vomiting.  Genitourinary: Negative for difficulty urinating and dysuria.  Musculoskeletal: Negative for back pain and gait problem.  Skin: Negative for rash.  Neurological: Negative for light-headedness and headaches.  Psychiatric/Behavioral: Positive for decreased concentration and dysphoric mood. Negative for agitation, behavioral problems, self-injury, sleep disturbance and suicidal ideas. The patient is nervous/anxious.   All other systems reviewed and are negative.   Per HPI unless specifically indicated above      Objective:    BP 125/82   Pulse 85   Temp 97.2 F (36.2 C) (Oral)   Ht 5\' 7"  (1.702 m)   Wt 299 lb 12.8 oz (136 kg)   BMI 46.96 kg/m   Wt Readings from Last 3 Encounters:  10/23/16 299 lb 12.8 oz (136 kg)  10/03/16 (!) 307 lb (139.3 kg)  09/13/16 (!) 305 lb (138.3 kg)    Physical Exam  Constitutional: She is oriented to person, place, and time. She appears well-developed and well-nourished. No distress.  Eyes: Conjunctivae are normal.  Neck: Neck supple. No thyromegaly present.  Cardiovascular: Normal rate, regular rhythm, normal heart sounds and intact  distal pulses.   No murmur heard. Pulmonary/Chest: Effort normal and breath sounds normal. No respiratory distress. She has no wheezes.  Abdominal: There is no hepatosplenomegaly. There is tenderness in the epigastric area. There is no rigidity, no rebound, no guarding and no CVA tenderness.  Musculoskeletal: Normal range of motion. She exhibits no edema or tenderness.  Lymphadenopathy:    She has no cervical adenopathy.  Neurological: She is alert and oriented  to person, place, and time. Coordination normal.  Skin: Skin is warm and dry. No rash noted. She is not diaphoretic.  Psychiatric: Her behavior is normal. Her mood appears anxious. She exhibits a depressed mood. She expresses no suicidal ideation. She expresses no suicidal plans.  Nursing note and vitals reviewed.      Assessment & Plan:   Problem List Items Addressed This Visit      Other   Anxiety and depression - Primary   Relevant Medications   ALPRAZolam (XANAX) 0.25 MG tablet   OLANZapine (ZYPREXA) 10 MG tablet   sertraline (ZOLOFT) 100 MG tablet    Other Visit Diagnoses    Gastroesophageal reflux disease with esophagitis       Relevant Medications   ranitidine (ZANTAC) 150 MG tablet       Follow up plan: Return if symptoms worsen or fail to improve.  Counseling provided for all of the vaccine components No orders of the defined types were placed in this encounter.   Arville CareJoshua Dettinger, MD [facility] 10/23/2016, 4:56 PM

## 2016-10-24 ENCOUNTER — Other Ambulatory Visit: Payer: Self-pay

## 2016-10-24 ENCOUNTER — Encounter: Payer: Self-pay | Admitting: Family Medicine

## 2016-10-24 DIAGNOSIS — M545 Low back pain: Principal | ICD-10-CM

## 2016-10-24 DIAGNOSIS — G8929 Other chronic pain: Secondary | ICD-10-CM

## 2016-10-24 MED ORDER — MELOXICAM 7.5 MG PO TABS: 15.0000 mg | ORAL_TABLET | Freq: Every day | ORAL | 1 refills | Status: DC

## 2016-11-11 ENCOUNTER — Encounter: Payer: Self-pay | Admitting: Pediatrics

## 2016-11-11 ENCOUNTER — Ambulatory Visit (INDEPENDENT_AMBULATORY_CARE_PROVIDER_SITE_OTHER): Payer: Medicaid Other | Admitting: Pediatrics

## 2016-11-11 VITALS — BP 130/77 | HR 84 | Temp 97.5°F | Ht 67.0 in | Wt 308.4 lb

## 2016-11-11 DIAGNOSIS — H9313 Tinnitus, bilateral: Secondary | ICD-10-CM | POA: Diagnosis not present

## 2016-11-11 DIAGNOSIS — E6609 Other obesity due to excess calories: Secondary | ICD-10-CM

## 2016-11-11 DIAGNOSIS — K219 Gastro-esophageal reflux disease without esophagitis: Secondary | ICD-10-CM | POA: Diagnosis not present

## 2016-11-11 DIAGNOSIS — Z6841 Body Mass Index (BMI) 40.0 and over, adult: Secondary | ICD-10-CM

## 2016-11-11 DIAGNOSIS — R1011 Right upper quadrant pain: Secondary | ICD-10-CM

## 2016-11-11 DIAGNOSIS — IMO0001 Reserved for inherently not codable concepts without codable children: Secondary | ICD-10-CM

## 2016-11-11 MED ORDER — ESOMEPRAZOLE MAGNESIUM 40 MG PO CPDR: 40.0000 mg | DELAYED_RELEASE_CAPSULE | Freq: Every day | ORAL | 1 refills | Status: DC

## 2016-11-11 NOTE — Progress Notes (Signed)
  Subjective:   Patient ID: Heidi [name], female    DOB: Sep 11, 1990, 26 y.o.   MRN: [id number] CC: Abdominal Pain; Nausea; Heartburn; and Gastroesophageal Reflux  HPI: Heidi [name] is a 26 y.o. female presenting for Abdominal Pain; Nausea; Heartburn; and Gastroesophageal Reflux  abd pain ongoing for a couple months Epigastric and RUQ areas After eating has abd pain Doesn't matter what she eats, cereal, fried food, salad Happening multiple times a day now Gets heart burn a lot, burning separate from pain Started on zantac, helps some Has been on it for a couple of weeks Gets nauseated with smelling certain foods, happens every few days doesnt matter what she eats Happens more in the evening Had sharp pain  No fevers Normal appetite Drinks EtOH a few times a month  Drinks regular cola multiple times  Not interested in decreasing or switching to diet  ringing in her ears happens about once a week, sometimes will have feeling of room spinning when it happens Lasts for a second, sometimes a few minutes Nothing seems to bring it on  Relevant past medical, surgical, family and social history reviewed. Allergies and medications reviewed and updated. History  Smoking Status  . Current Every Day Smoker  . Packs/day: 1.00  . Years: 10.00  . Types: Cigarettes  Smokeless Tobacco  . Never Used   ROS: Per HPI   Objective:    BP 130/77   Pulse 84   Temp 97.5 F (36.4 C) (Oral)   Ht _0  (1.702 m)   Wt (!) 308 lb 6.4 oz (139.9 kg)   BMI 48.30 kg/m   Wt Readings from Last 3 Encounters:  11/11/16 (!) 308 lb 6.4 oz (139.9 kg)  10/23/16 299 lb 12.8 oz (136 kg)  10/03/16 (!) 307 lb (139.3 kg)    Gen: NAD, alert, cooperative with exam, NCAT EYES: EOMI, no conjunctival injection, or no icterus ENT:  Normal L TM, R TM obscurred by cerumen, OP without erythema LYMPH: no cervical LAD CV: NRRR, normal S1/S2, no murmur, distal pulses 2+ b/l Resp: CTABL, no wheezes, normal WOB Abd:  +BS, soft, obese, mildly tender epigastric area and RUQ, neg murphys sign, no guarding or organomegaly Ext: No edema, warm Neuro: Alert and oriented  Assessment & Plan:  Heidi [name] was seen today for abdominal pain, nausea, heartburn and gastroesophageal reflux.  Diagnoses and all orders for this visit:  Gastroesophageal reflux disease, esophagitis presence not specified Stop ranitidine, start below Avoid greasy foods Check labs today -     esomeprazole (NEXIUM) 40 MG capsule; Take 1 capsule (40 mg total) by mouth daily. -     CMP14+EGFR  RUQ pain Treat GERD as above, u/s to eval gall bladder -     Lipase -     CBC with Differential/Platelet -     US Abdomen Complete; Future  Class 3 obesity due to excess calories with serious comorbidity and body mass index (BMI) of 45.0 to 49.9 in adult [facility]) Discussed can make gerd symptoms worse, talked about lifestyle changes, cutting out regular sodas  Tinnitus of both ears Happens about once a week Check BP, BGL regularly at home Let us know if elevated, low  Follow up plan: 2 mo, sooner if needed Assunta Found, MD [place]

## 2016-11-12 ENCOUNTER — Telehealth: Payer: Self-pay | Admitting: Pediatrics

## 2016-11-12 ENCOUNTER — Encounter: Payer: Self-pay | Admitting: Pediatrics

## 2016-11-12 LAB — CBC WITH DIFFERENTIAL/PLATELET
BASOS ABS: 0.1 10*3/uL (ref 0.0–0.2)
Basos: 1 %
EOS (ABSOLUTE): 0.3 10*3/uL (ref 0.0–0.4)
Eos: 3 %
Hematocrit: 39.8 % (ref 34.0–46.6)
Hemoglobin: 13.2 g/dL (ref 11.1–15.9)
Immature Grans (Abs): 0 10*3/uL (ref 0.0–0.1)
Immature Granulocytes: 0 %
LYMPHS ABS: 2.5 10*3/uL (ref 0.7–3.1)
Lymphs: 26 %
MCH: 30.6 pg (ref 26.6–33.0)
MCHC: 33.2 g/dL (ref 31.5–35.7)
MCV: 92 fL (ref 79–97)
MONOS ABS: 0.5 10*3/uL (ref 0.1–0.9)
Monocytes: 5 %
NEUTROS ABS: 6.3 10*3/uL (ref 1.4–7.0)
Neutrophils: 65 %
PLATELETS: 247 10*3/uL (ref 150–379)
RBC: 4.31 x10E6/uL (ref 3.77–5.28)
RDW: 13.5 % (ref 12.3–15.4)
WBC: 9.7 10*3/uL (ref 3.4–10.8)

## 2016-11-12 LAB — CMP14+EGFR
A/G RATIO: 2 (ref 1.2–2.2)
ALBUMIN: 4.2 g/dL (ref 3.5–5.5)
ALK PHOS: 128 IU/L — AB (ref 39–117)
ALT: 24 IU/L (ref 0–32)
AST: 16 IU/L (ref 0–40)
BUN / CREAT RATIO: 15 (ref 9–23)
BUN: 10 mg/dL (ref 6–20)
CHLORIDE: 101 mmol/L (ref 96–106)
CO2: 24 mmol/L (ref 18–29)
Calcium: 9.2 mg/dL (ref 8.7–10.2)
Creatinine, Ser: 0.65 mg/dL (ref 0.57–1.00)
GFR calc non Af Amer: 122 mL/min/{1.73_m2} (ref >59)
GFR, EST AFRICAN AMERICAN: 141 mL/min/{1.73_m2} (ref >59)
GLOBULIN, TOTAL: 2.1 g/dL (ref 1.5–4.5)
GLUCOSE: 131 mg/dL — AB (ref 65–99)
POTASSIUM: 4.4 mmol/L (ref 3.5–5.2)
SODIUM: 139 mmol/L (ref 134–144)
Total Protein: 6.3 g/dL (ref 6.0–8.5)

## 2016-11-12 LAB — LIPASE: LIPASE: 31 U/L (ref 14–72)

## 2016-11-12 NOTE — Telephone Encounter (Signed)
Called and talked with pt, wrote note for work

## 2016-11-14 ENCOUNTER — Encounter: Payer: Self-pay | Admitting: Pediatrics

## 2016-11-14 ENCOUNTER — Telehealth: Payer: Self-pay | Admitting: Family Medicine

## 2016-11-14 NOTE — Telephone Encounter (Signed)
That's fine

## 2016-11-14 NOTE — Telephone Encounter (Signed)
Letter printed and will fax, pt is aware.

## 2016-11-15 ENCOUNTER — Emergency Department (HOSPITAL_COMMUNITY)
Admission: EM | Admit: 2016-11-15 | Discharge: 2016-11-16 | Disposition: A | Discharge status: Home / Self Care | Payer: Medicaid Other | Attending: Emergency Medicine | Admitting: Emergency Medicine

## 2016-11-15 ENCOUNTER — Encounter (HOSPITAL_COMMUNITY): Payer: Self-pay

## 2016-11-15 ENCOUNTER — Telehealth: Payer: Self-pay | Admitting: Family Medicine

## 2016-11-15 ENCOUNTER — Encounter: Payer: Self-pay | Admitting: Pediatrics

## 2016-11-15 DIAGNOSIS — R143 Flatulence: Secondary | ICD-10-CM | POA: Insufficient documentation

## 2016-11-15 DIAGNOSIS — E119 Type 2 diabetes mellitus without complications: Secondary | ICD-10-CM | POA: Insufficient documentation

## 2016-11-15 DIAGNOSIS — F17210 Nicotine dependence, cigarettes, uncomplicated: Secondary | ICD-10-CM | POA: Diagnosis not present

## 2016-11-15 DIAGNOSIS — Z79899 Other long term (current) drug therapy: Secondary | ICD-10-CM | POA: Insufficient documentation

## 2016-11-15 DIAGNOSIS — M549 Dorsalgia, unspecified: Secondary | ICD-10-CM | POA: Insufficient documentation

## 2016-11-15 DIAGNOSIS — R1031 Right lower quadrant pain: Secondary | ICD-10-CM

## 2016-11-15 DIAGNOSIS — I10 Essential (primary) hypertension: Secondary | ICD-10-CM | POA: Diagnosis not present

## 2016-11-15 DIAGNOSIS — Z7984 Long term (current) use of oral hypoglycemic drugs: Secondary | ICD-10-CM | POA: Diagnosis not present

## 2016-11-15 DIAGNOSIS — R110 Nausea: Secondary | ICD-10-CM | POA: Insufficient documentation

## 2016-11-15 LAB — CBC
HCT: 40.2 % (ref 36.0–46.0)
HEMOGLOBIN: 14.1 g/dL (ref 12.0–15.0)
MCH: 31.7 pg (ref 26.0–34.0)
MCHC: 35.1 g/dL (ref 30.0–36.0)
MCV: 90.3 fL (ref 78.0–100.0)
Platelets: 276 10*3/uL (ref 150–400)
RBC: 4.45 MIL/uL (ref 3.87–5.11)
RDW: 12.7 % (ref 11.5–15.5)
WBC: 14.3 10*3/uL — ABNORMAL HIGH (ref 4.0–10.5)

## 2016-11-15 LAB — COMPREHENSIVE METABOLIC PANEL
ALBUMIN: 4.3 g/dL (ref 3.5–5.0)
ALT: 29 U/L (ref 14–54)
ANION GAP: 10 (ref 5–15)
AST: 21 U/L (ref 15–41)
Alkaline Phosphatase: 114 U/L (ref 38–126)
BUN: 10 mg/dL (ref 6–20)
CHLORIDE: 100 mmol/L — AB (ref 101–111)
CO2: 28 mmol/L (ref 22–32)
Calcium: 9.6 mg/dL (ref 8.9–10.3)
Creatinine, Ser: 0.76 mg/dL (ref 0.44–1.00)
GFR calc non Af Amer: >60 mL/min (ref >60)
Glucose, Bld: 129 mg/dL — ABNORMAL HIGH (ref 65–99)
Potassium: 3.9 mmol/L (ref 3.5–5.1)
SODIUM: 138 mmol/L (ref 135–145)
Total Bilirubin: 0.3 mg/dL (ref 0.3–1.2)
Total Protein: 7.3 g/dL (ref 6.5–8.1)

## 2016-11-15 LAB — URINALYSIS, ROUTINE W REFLEX MICROSCOPIC
Bilirubin Urine: NEGATIVE
Glucose, UA: NEGATIVE mg/dL
Hgb urine dipstick: NEGATIVE
Ketones, ur: NEGATIVE mg/dL
Leukocytes, UA: NEGATIVE
NITRITE: NEGATIVE
PROTEIN: NEGATIVE mg/dL
SPECIFIC GRAVITY, URINE: 1.01 (ref 1.005–1.030)
pH: 7 (ref 5.0–8.0)

## 2016-11-15 LAB — LIPASE, BLOOD: LIPASE: 18 U/L (ref 11–51)

## 2016-11-15 NOTE — ED Provider Notes (Signed)
[facility]-EMER[facility]ENCY DEPT Provider Note   CSN: [id number] Arrival date & time: 11/15/16  2115     History   Chief Complaint Chief Complaint  Patient presents with  . Abdominal Pain    HPI Heidi [name] is a 26 y.o. female who presents to the  ED with abdominal pain. She reports she has had abdominal pain off and on for a week. She went to her PCP and was told it may be her gall bladder or a hernia. Today at 3 pm the pain started getting worse. She reports nausea but no vomiting.  The pain is generalized. Patient reports sometimes it is in the upper abdomen but then it moves to the lower abdomen. She denies fever but has had chills.   The history is provided by the patient. No language interpreter was used.  Abdominal Pain   This is a new problem. The current episode started more than 1 week ago. Episode frequency: comes and goes. The pain is located in the generalized abdominal region. The pain is at a severity of 6/10. Associated symptoms include flatus, nausea and myalgias. Pertinent negatives include fever, vomiting, dysuria, frequency, hematuria and headaches. Nothing relieves the symptoms. Her past medical history is significant for GERD.    Past Medical History:  Diagnosis Date  . Abdominal pain 01/10/2016  . Anxiety   . Back pain   . Bloating 01/10/2016  . Depression   . GERD (gastroesophageal reflux disease)   . Hematuria 01/10/2016  . Herpes simplex virus (HSV) infection   . Hypertension   . Neuropathy (HCC)    idopathic  . Osteoarthritis   . Prediabetes   . Schizo-affective schizophrenia (HCC)   . Type 2 diabetes mellitus (HCC) 03/28/2016  . Urinary frequency 01/10/2016    Patient Active Problem List   Diagnosis Date Noted  . Type 2 diabetes mellitus (HCC) 03/28/2016  . Paresthesia 01/18/2016  . Pain in limb 01/18/2016  . Neuropathic pain 11/10/2015  . Essential hypertension, benign 07/21/2015  . Anxiety and depression 06/22/2015    Past Surgical History:    Procedure Laterality Date  . CESAREAN SECTION    . DILATION AND CURETTAGE OF UTERUS    . ESSURE TUBAL LIGATION    . TUBAL LIGATION      OB History    Gravida Para Term Preterm AB Living   2 1     1 1    SAB TAB Ectopic Multiple Live Births   1               Home Medications    Prior to Admission medications   Medication Sig Start Date End Date Taking? Authorizing Provider  albuterol (PROVENTIL HFA;VENTOLIN HFA) 108 (90 Base) MCG/ACT inhaler Inhale 2 puffs into the lungs every 6 (six) hours as needed for wheezing or shortness of breath. 02/12/16   Elige RadonJoshua A Dettinger, MD  ALPRAZolam Prudy Feeler(XANAX) 0.25 MG tablet Take 1 tablet (0.25 mg total) by mouth 2 (two) times daily as needed for anxiety. 10/23/16   Elige RadonJoshua A Dettinger, MD  amLODipine (NORVASC) 5 MG tablet Take 0.5 tablets (2.5 mg total) by mouth daily. 08/08/16   Elige RadonJoshua A Dettinger, MD  cholecalciferol (VITAMIN D) 1000 units tablet Take 1,000 Units by mouth daily.    Historical Provider, MD  cyclobenzaprine (FLEXERIL) 10 MG tablet Take 1 tablet (10 mg total) by mouth 3 (three) times daily as needed for muscle spasms. 08/02/16   Elige RadonJoshua A Dettinger, MD  esomeprazole (NEXIUM) 40 MG capsule Take 1  capsule (40 mg total) by mouth daily. 11/11/16   Johna Sheriff, MD  glucose blood (ACCU-CHEK GUIDE) test strip Use to check BG once daily at varying times of day. 03/28/16   Henrene Pastor, PharmD  Lancets 30G MISC Dispense Accu-Chek guide lancets.  Check blood sugar at varying times once per day. 04/01/16   Elige Radon Dettinger, MD  lisinopril (PRINIVIL,ZESTRIL) 20 MG tablet Take 1 tablet (20 mg total) by mouth daily. 11/10/15   Elige Radon Dettinger, MD  meloxicam (MOBIC) 7.5 MG tablet Take 2 tablets (15 mg total) by mouth daily. 10/24/16   Elige Radon Dettinger, MD  metFORMIN (GLUCOPHAGE) 500 MG tablet Take 1 tablet (500 mg total) by mouth 2 (two) times daily with a meal. 04/01/16   Elige Radon Dettinger, MD  milk thistle 175 MG tablet Take 175 mg by mouth daily.     Historical Provider, MD  OLANZapine (ZYPREXA) 10 MG tablet Take 1 tablet (10 mg total) by mouth at bedtime. 10/23/16   Elige Radon Dettinger, MD  Omega-3 Fatty Acids (FISH OIL) 1000 MG CAPS Take 1,000 mg by mouth daily.    Historical Provider, MD  sertraline (ZOLOFT) 100 MG tablet Take 2 tablets (200 mg total) by mouth at bedtime. 10/23/16   Elige Radon Dettinger, MD  traZODone (DESYREL) 50 MG tablet Take 50 mg by mouth at bedtime as needed for sleep.    Historical Provider, MD  triamcinolone cream (KENALOG) 0.1 % Apply 1 application topically 2 (two) times daily. 08/08/16   Elige Radon Dettinger, MD  Turmeric 500 MG CAPS Take 1,500 Units by mouth daily.     Historical Provider, MD    Family History Family History  Problem Relation Age of Onset  . Depression Mother   . Hypertension Mother   . Diabetes Mother   . Mental illness Father   . Hypertension Father   . Diabetes Father   . Cancer Maternal Aunt   . Diabetes Paternal Grandmother   . Hypertension Paternal Grandmother   . Arthritis Paternal Grandmother   . Stroke Paternal Grandfather   . Heart disease Paternal Grandfather   . Hypertension Paternal Grandfather   . Diabetes Paternal Grandfather   . Schizophrenia Maternal Grandfather   . Alcohol abuse Maternal Grandfather   . Other Sister     recovering addict  . Schizophrenia Sister   . Irritable bowel syndrome Sister   . Other Sister     recovering addict  . Bipolar disorder Sister   . Seizures Sister     Social History Social History  Substance Use Topics  . Smoking status: Current Every Day Smoker    Packs/day: 1.00    Years: 10.00    Types: Cigarettes  . Smokeless tobacco: Never Used  . Alcohol use 0.0 oz/week     Comment: Occasionally      Allergies   Hydroxyzine; Lorazepam; Morphine and related; and Quetiapine   Review of Systems Review of Systems  Constitutional: Negative for fever.  Respiratory: Negative for shortness of breath.   Gastrointestinal: Positive  for abdominal pain, flatus and nausea. Negative for vomiting.  Genitourinary: Negative for dysuria, frequency, hematuria, urgency, vaginal bleeding and vaginal discharge.  Musculoskeletal: Positive for back pain and myalgias.  Skin: Negative for rash.  Neurological: Negative for syncope and headaches.  Psychiatric/Behavioral: Negative for confusion.     Physical Exam Updated Vital Signs BP 135/86 (BP Location: Right Arm)   Pulse 90   Temp 98.1 F (36.7 C) (Temporal)  Resp 20   Ht 5\' 7"  (1.702 m)   Wt (!) 139.7 kg   SpO2 100%   BMI 48.24 kg/m   Physical Exam  Constitutional: She is oriented to person, place, and time. She appears well-developed and well-nourished. No distress.  HENT:  Head: Normocephalic and atraumatic.  Eyes: EOM are normal.  Neck: Normal range of motion. Neck supple.  Pulmonary/Chest: Effort normal.  Abdominal: Soft. Bowel sounds are normal. There is tenderness in the right lower quadrant. There is no rebound and no CVA tenderness.  Abdomen is obese, there is generalized tenderness on palpation with increased tenderness to the RLQ.   Genitourinary:  Genitourinary Comments: External genitalia without lesions, mucous d/c vaginal vault, no CMT, right adnexal tenderness. Unable to palpate uterus due to patient habitus.   Musculoskeletal: Normal range of motion.  Neurological: She is alert and oriented to person, place, and time. No cranial nerve deficit.  Skin: Skin is warm and dry.  Psychiatric: She has a normal mood and affect. Her behavior is normal.  Nursing note and vitals reviewed.    ED Treatments / Results  Labs (all labs ordered are listed, but only abnormal results are displayed) Labs Reviewed  WET PREP, GENITAL - Abnormal; Notable for the following:       Result Value   WBC, Wet Prep HPF POC FEW (*)    All other components within normal limits  COMPREHENSIVE METABOLIC PANEL - Abnormal; Notable for the following:    Chloride 100 (*)     Glucose, Bld 129 (*)    All other components within normal limits  CBC - Abnormal; Notable for the following:    WBC 14.3 (*)    All other components within normal limits  LIPASE, BLOOD  URINALYSIS, ROUTINE W REFLEX MICROSCOPIC  PREGNANCY, URINE  POC URINE PREG, ED  GC/CHLAMYDIA PROBE AMP ([place]) NOT AT [facility]    Radiology No results found.  Procedures Procedures (including critical care time)  Medications Ordered in ED Medications - No data to display   Initial Impression / Assessment and Plan / ED Course  I have reviewed the triage vital signs and the nursing notes.  Final Clinical Impressions(s) / ED Diagnoses   Final diagnoses:  Right lower quadrant abdominal pain   Patient awaiting CT, care turned over to Dr. Manus Gunning @ [phone].    [address] [place], [place] 11/16/16 9604    Glynn Octave, MD 11/16/16 [phone]

## 2016-11-15 NOTE — ED Triage Notes (Signed)
I was seen at my primary care this week for abdominal pain.  I am having pain in between my shoulder blades, in my upper right abdomen, and in my pelvis.  She thinks I may have problems with my gallbladder and a possible hernia.  She told me if I became worse to go to the ER.

## 2016-11-16 ENCOUNTER — Emergency Department (HOSPITAL_COMMUNITY): Payer: Medicaid Other

## 2016-11-16 LAB — WET PREP, GENITAL
Clue Cells Wet Prep HPF POC: NONE SEEN
SPERM: NONE SEEN
Trich, Wet Prep: NONE SEEN
YEAST WET PREP: NONE SEEN

## 2016-11-16 LAB — PREGNANCY, URINE: PREG TEST UR: NEGATIVE

## 2016-11-16 MED ORDER — IOPAMIDOL (ISOVUE-300) INJECTION 61%
100.0000 mL | Freq: Once | INTRAVENOUS | Status: AC | PRN
  Administered 2016-11-16: 100 mL via INTRAVENOUS

## 2016-11-16 MED ORDER — ONDANSETRON HCL 4 MG PO TABS: 4.0000 mg | ORAL_TABLET | Freq: Three times a day (TID) | ORAL | 0 refills | Status: DC | PRN

## 2016-11-16 MED ORDER — IOPAMIDOL (ISOVUE-300) INJECTION 61%
30.0000 mL | Freq: Once | INTRAVENOUS | Status: AC | PRN
  Administered 2016-11-16: 30 mL via ORAL

## 2016-11-16 MED ORDER — DIPHENHYDRAMINE HCL 25 MG PO CAPS
25.0000 mg | ORAL_CAPSULE | Freq: Once | ORAL | Status: AC
Start: 2016-11-16 — End: 2016-11-16
  Administered 2016-11-16: 25 mg via ORAL
  Filled 2016-11-16: qty 1

## 2016-11-16 NOTE — Discharge Instructions (Signed)
Your testing is reassuring. Follow-up with your primary doctor or gynecologist for an ultrasound of your uterus and ovaries. Return to the emergency room if you develop new or worsening symptoms.

## 2016-11-16 NOTE — ED Notes (Signed)
PT states understanding of care given, follow up care, and medication prescribed. PT ambulated from ED to car with a steady gait. 

## 2016-11-16 NOTE — ED Provider Notes (Signed)
CT scan negative for acute pathology. Urinalysis negative. Labs reassuring. Normal LFTs and lipase. Patient still with some right lower quadrant tenderness on exam.  Advised follow-up with GYN for pelvic ultrasound. Low suspicion for ovarian torsion. Pain is been ongoing for the past one week. Return precautions discussed.  BP 126/86 (BP Location: Right Arm)   Pulse 81   Temp 98.5 F (36.9 C) (Oral)   Resp 18   Ht 5\' 7"  (1.702 m)   Wt (!) 308 lb (139.7 kg)   SpO2 94%   BMI 48.24 kg/m     Glynn OctaveStephen Illianna Paschal, MD 11/16/16 [phone]

## 2016-11-16 NOTE — ED Notes (Signed)
Was able to tolerate water

## 2016-11-18 ENCOUNTER — Telehealth: Payer: Self-pay | Admitting: Family Medicine

## 2016-11-18 LAB — GC/CHLAMYDIA PROBE AMP (~~LOC~~) NOT AT ARMC
Chlamydia: NEGATIVE
Neisseria Gonorrhea: NEGATIVE

## 2016-11-18 NOTE — Telephone Encounter (Signed)
Appt made to see Dr. Oswaldo DoneVincent

## 2016-11-18 NOTE — Telephone Encounter (Signed)
Will need to be seen- gall blader pain and pelvic pain are 2 different things

## 2016-11-18 NOTE — Telephone Encounter (Signed)
Patient aware that note has been sent to Dr. Oswaldo DoneVincent and Covering provider to review and will call back if they are wanting a US done of uterus and ovaries

## 2016-11-20 ENCOUNTER — Ambulatory Visit (INDEPENDENT_AMBULATORY_CARE_PROVIDER_SITE_OTHER): Payer: Medicaid Other | Admitting: Pediatrics

## 2016-11-20 ENCOUNTER — Encounter: Payer: Self-pay | Admitting: Pediatrics

## 2016-11-20 VITALS — BP 133/83 | HR 79 | Temp 97.2°F | Ht 67.0 in | Wt 311.6 lb

## 2016-11-20 DIAGNOSIS — R109 Unspecified abdominal pain: Secondary | ICD-10-CM | POA: Diagnosis not present

## 2016-11-20 DIAGNOSIS — R140 Abdominal distension (gaseous): Secondary | ICD-10-CM | POA: Diagnosis not present

## 2016-11-20 NOTE — Progress Notes (Signed)
  Subjective:   Patient ID: Heidi [name], female    DOB: 12/19/1990, 26 y.o.   MRN: [id number] CC: Tingling pain in back; Back cramps; and Upper abdominal pain  HPI: Heidi [name] is a 26 y.o. female presenting for Tingling pain in back; Back cramps; and Upper abdominal pain  Had some RUQ pain two days ago that lasted for 3-4 minutes Says was severe, couldn't breathe Only thing that helped was bending over sofa and stretching out arm and side Seen in ED a few days ago, was having lower abd pain at the time, says felt like a needle coming in and out below her belly button Had a normal CT scan The pain was there for apprx 2 days LMP 1 week ago Used to have heavy periods Decreased bleeding recently, still regular Has essure for birth control Does have bad cramps sometimes Has been on nexium for a couple of weeks Does think it is helping some with her abd symptoms Now thinks that bloating bothers her the most Feels it in her upper abd Sometimes has discomfort or pressure with intercourse, not every time No vaginal discarge, no new sexual partners  Relevant past medical, surgical, family and social history reviewed. Allergies and medications reviewed and updated. History  Smoking Status  . Current Every Day Smoker  . Packs/day: 1.00  . Years: 10.00  . Types: Cigarettes  Smokeless Tobacco  . Never Used   ROS: Per HPI   Objective:    BP 133/83   Pulse 79   Temp 97.2 F (36.2 C) (Oral)   Ht 5\' 7"  (1.702 m)   Wt (!) 311 lb 9.6 oz (141.3 kg)   BMI 48.80 kg/m   Wt Readings from Last 3 Encounters:  11/20/16 (!) 311 lb 9.6 oz (141.3 kg)  11/15/16 (!) 308 lb (139.7 kg)  11/11/16 (!) 308 lb 6.4 oz (139.9 kg)    Gen: NAD, alert, cooperative with exam, NCAT EYES: EOMI, no conjunctival injection, or no icterus ENT:  OP without erythema LYMPH: no cervical LAD CV: NRRR, normal S1/S2, no murmur, distal pulses 2+ b/l Resp: CTABL, no wheezes, normal WOB Abd: +BS, soft, no  tenderness with palpation, obese. no guarding Ext: No edema, warm Neuro: Alert and oriented MSK: normal muscle bulk  Assessment & Plan:  Heidi [name] was seen today for tingling pain in back, back cramps and upper abdominal pain.  Diagnoses and all orders for this visit:  Bloating Abdominal pain, unspecified abdominal location Improved pain On PPI now Continues to have bloating feeling trial FODMAPs diet Discussed return precautions  Follow up plan: Return in about 2 months (around 01/20/2017). Rex Krasarol Vincent, MD [facility]

## 2016-11-20 NOTE — Patient Instructions (Signed)
Continue nexium Try low FODMAPs diet to help with bloating High fiber diet Stool softener or senna if no stool in 24 h

## 2016-11-21 ENCOUNTER — Ambulatory Visit (HOSPITAL_COMMUNITY): Admission: RE | Admit: 2016-11-21 | Payer: Medicaid Other | Source: Ambulatory Visit

## 2016-11-25 ENCOUNTER — Encounter: Payer: Self-pay | Admitting: Family Medicine

## 2016-11-25 MED ORDER — CYCLOBENZAPRINE HCL 10 MG PO TABS
10.0000 mg | ORAL_TABLET | Freq: Three times a day (TID) | ORAL | 2 refills | Status: DC | PRN
Start: 2016-11-25 — End: 2017-03-28

## 2016-12-04 ENCOUNTER — Encounter: Payer: Self-pay | Admitting: Family Medicine

## 2016-12-18 ENCOUNTER — Ambulatory Visit (INDEPENDENT_AMBULATORY_CARE_PROVIDER_SITE_OTHER): Payer: Medicaid Other | Admitting: Pharmacist

## 2016-12-18 VITALS — BP 122/78 | HR 80 | Ht 67.0 in | Wt 313.0 lb

## 2016-12-18 DIAGNOSIS — E119 Type 2 diabetes mellitus without complications: Secondary | ICD-10-CM | POA: Diagnosis not present

## 2016-12-18 LAB — BAYER DCA HB A1C WAIVED: HB A1C: 6.6 % (ref <7.0)

## 2016-12-18 NOTE — Progress Notes (Signed)
Patient ID: Heidi [name], female   DOB: 06/10/1991, 26 y.o.   MRN: [id number]   Subjective:    Heidi [name] is a 26 y.o. female who presents for reevlauation type 2 DM.  She was first diagnosed about 1 year ago.   Last A1c = 6.6% (07/01/2016)  HBG readings: 83, 110, 113, 213, 170, 97, 138 90 day average = 102 (#3 readings)  Cardiovascular risk factors: hypertension, obesity (BMI >= 30 kg/m2), sedentary lifestyle and smoking/ tobacco exposure   Current Outpatient Prescriptions:  .  albuterol (PROVENTIL HFA;VENTOLIN HFA) 108 (90 Base) MCG/ACT inhaler, Inhale 2 puffs into the lungs every 6 (six) hours as needed for wheezing or shortness of breath., Disp: 1 Inhaler, Rfl: 0 .  ALPRAZolam (XANAX) 0.25 MG tablet, Take 1 tablet (0.25 mg total) by mouth 2 (two) times daily as needed for anxiety., Disp: 10 tablet, Rfl: 1 .  amLODipine (NORVASC) 5 MG tablet, Take 0.5 tablets (2.5 mg total) by mouth daily., Disp: 90 tablet, Rfl: 1 .  cholecalciferol (VITAMIN D) 1000 units tablet, Take 1,000 Units by mouth daily., Disp: , Rfl:  .  cyclobenzaprine (FLEXERIL) 10 MG tablet, Take 1 tablet (10 mg total) by mouth 3 (three) times daily as needed for muscle spasms., Disp: 30 tablet, Rfl: 2 .  esomeprazole (NEXIUM) 40 MG capsule, Take 1 capsule (40 mg total) by mouth daily., Disp: 30 capsule, Rfl: 1 .  glucose blood (ACCU-CHEK GUIDE) test strip, Use to check BG once daily at varying times of day., Disp: 100 each, Rfl: 3 .  Lancets 30G MISC, Dispense Accu-Chek guide lancets.  Check blood sugar at varying times once per day., Disp: 100 each, Rfl: 11 .  lisinopril (PRINIVIL,ZESTRIL) 20 MG tablet, Take 1 tablet (20 mg total) by mouth daily., Disp: 90 tablet, Rfl: 3 .  meloxicam (MOBIC) 7.5 MG tablet, Take 2 tablets (15 mg total) by mouth daily., Disp: 60 tablet, Rfl: 1 .  metFORMIN (GLUCOPHAGE) 500 MG tablet, Take 1 tablet (500 mg total) by mouth 2 (two) times daily with a meal. (Patient taking differently: Take 500  mg by mouth daily with breakfast. ), Disp: 180 tablet, Rfl: 1 .  milk thistle 175 MG tablet, Take 175 mg by mouth daily., Disp: , Rfl:  .  OLANZapine (ZYPREXA) 10 MG tablet, Take 1 tablet (10 mg total) by mouth at bedtime., Disp: 90 tablet, Rfl: 1 .  Omega-3 Fatty Acids (FISH OIL) 1000 MG CAPS, Take 1,000 mg by mouth daily., Disp: , Rfl:  .  sertraline (ZOLOFT) 100 MG tablet, Take 2 tablets (200 mg total) by mouth at bedtime., Disp: 180 tablet, Rfl: 2 .  traZODone (DESYREL) 50 MG tablet, Take 50 mg by mouth at bedtime as needed for sleep., Disp: , Rfl:  .  triamcinolone cream (KENALOG) 0.1 %, Apply 1 application topically 2 (two) times daily., Disp: 80 g, Rfl: 0 .  Turmeric 500 MG CAPS, Take 1,500 Units by mouth daily. , Disp: , Rfl:    Weight trend: decreased by 9# since last visit 03/28/16 Prior visit with CDE - yes Current diet: in general, a "healthy" diet  , follow CHO counting diet.  Patient states she is not currently working so money is tight for groceries right now.  Her boyfriend cooks meals.  Trying to choose lean proteins and limit processed foods and sodium intake Patient drinks water  Current exercise: Has starting walking 1 or 2 times per week  Is She on ACE inhibitor or angiotensin II receptor  blocker?  Yes  lisinopril (Prinivil)    The following portions of the patient's history were reviewed and updated as appropriate: allergies, current medications, past family history, past medical history, past social history, past surgical history and problem list.  Objective:    BP 122/78   Pulse 80   Ht  (1.702 m)   Wt (!) 313 lb (142 kg)   BMI 49.02 kg/m    A1c = 6.00% (11/29/2015) A1c = 6.6% (02/12/2016) A1c = 6.6% (07/01/2016)  A1c = 6.6% today   Lab Review Glucose (mg/dL)  Date Value  16/06/9603 131 (H)   Glucose, Bld (mg/dL)  Date Value  54/05/8118 129 (H)  07/04/2016 116 (H)  12/04/2015 108 (H)   CO2 (mmol/L)  Date Value  11/15/2016 28   11/11/2016 24  07/04/2016 24   BUN (mg/dL)  Date Value  14/78/2956 10  11/11/2016 10  07/04/2016 9  12/04/2015 8  09/26/2015 7   Creatinine, Ser (mg/dL)  Date Value  21/30/8657 0.76  11/11/2016 0.65  07/04/2016 0.78    Assessment:   Type 2 DM - controlled Obesity - weight down 9# since 04/01/2016   Plan:    1.  Rx changes:   Continue metfromin XR  QD with food 2.  Discussed BG goals - recommended she increase frequencyof checking BG to 1-2 times per week   3. Reviewed CHO / Calorie counting diet and ways to decreased calories in diet. Samples diet plan given 4.  Continue to  increase physical activity - try to increase frequency to 5 days per week 5. Follow up: PCP in 1 week and me in 6 months  Orders Placed This Encounter  Procedures  . Bayer DCA Hb A1c Waived

## 2016-12-19 ENCOUNTER — Encounter: Payer: Self-pay | Admitting: Pharmacist

## 2016-12-23 ENCOUNTER — Ambulatory Visit (INDEPENDENT_AMBULATORY_CARE_PROVIDER_SITE_OTHER): Payer: Medicaid Other | Admitting: Family Medicine

## 2016-12-23 ENCOUNTER — Encounter: Payer: Self-pay | Admitting: Family Medicine

## 2016-12-23 VITALS — BP 143/86 | HR 86 | Temp 98.0°F | Ht 67.0 in | Wt 314.0 lb

## 2016-12-23 DIAGNOSIS — G8929 Other chronic pain: Secondary | ICD-10-CM

## 2016-12-23 DIAGNOSIS — I10 Essential (primary) hypertension: Secondary | ICD-10-CM | POA: Diagnosis not present

## 2016-12-23 DIAGNOSIS — M25561 Pain in right knee: Secondary | ICD-10-CM

## 2016-12-23 DIAGNOSIS — M25562 Pain in left knee: Secondary | ICD-10-CM

## 2016-12-23 MED ORDER — AMLODIPINE BESYLATE 5 MG PO TABS: 5.0000 mg | ORAL_TABLET | Freq: Every day | ORAL | 1 refills | Status: DC

## 2016-12-23 MED ORDER — METHYLPREDNISOLONE ACETATE 80 MG/ML IJ SUSP
80.0000 mg | Freq: Once | INTRAMUSCULAR | Status: AC
  Administered 2016-12-23: 80 mg via INTRAMUSCULAR

## 2016-12-23 NOTE — Progress Notes (Signed)
BP (!) 143/86   Pulse 86   Temp 98 F (36.7 C) (Oral)   Ht  (1.702 m)   Wt (!) 314 lb (142.4 kg)   BMI 49.18 kg/m    Subjective:    Patient ID: Heidi [name], female    DOB: 12-01-1990, 26 y.o.   MRN: [id number]  HPI: Heidi [name] is a 26 y.o. female presenting on 12/23/2016 for Bilateral knee pain (would like injections) and Hypertension (still having some readings in the evenings, would like to increase Amlodipine to 5 mg )   HPI Bilateral knee pain, chronic, recurrent Patient is coming in with bilateral knee pain that is chronic and recurrent. She has had injections 2 times previously and those long-term risk of the injections on her ligaments and tendons. The injections have given her more and more relief each time and she will try and space out as much as she can this next time. She said she made it through and a half months of relief as last time. She denies any fever or chills or redness or warmth. The knee pain is more diffuse but also hurts anteriorly and medially. The knee pain is related to chronic rheumatic illness.  Hypertension Patient has been having more elevated blood pressure recently. Her blood pressures been running in the 140s over 80s here and at home. She is currently on amlodipine 2.5 and lisinopril 20 and would like to know if we can increase the amlodipine just a little bit to see if they can monitor. She denies any lightheadedness or dizziness. Patient denies headaches, blurred vision, chest pains, shortness of breath, or weakness. Denies any side effects from medication and is content with current medication.   Relevant past medical, surgical, family and social history reviewed and updated as indicated. Interim medical history since our last visit reviewed. Allergies and medications reviewed and updated.  Review of Systems  Constitutional: Negative for chills and fever.  Respiratory: Negative for chest tightness and shortness of breath.     Cardiovascular: Negative for chest pain and leg swelling.  Musculoskeletal: Positive for arthralgias. Negative for back pain, gait problem and joint swelling.  Skin: Negative for rash.  Neurological: Negative for light-headedness and headaches.  Psychiatric/Behavioral: Negative for agitation and behavioral problems.  All other systems reviewed and are negative.   Per HPI unless specifically indicated above        Objective:    BP (!) 143/86   Pulse 86   Temp 98 F (36.7 C) (Oral)   Ht  (1.702 m)   Wt (!) 314 lb (142.4 kg)   BMI 49.18 kg/m   Wt Readings from Last 3 Encounters:  12/23/16 (!) 314 lb (142.4 kg)  12/18/16 (!) 313 lb (142 kg)  11/20/16 (!) 311 lb 9.6 oz (141.3 kg)    Physical Exam  Constitutional: She is oriented to person, place, and time. She appears well-developed and well-nourished. No distress.  Eyes: Conjunctivae are normal.  Cardiovascular: Normal rate, regular rhythm, normal heart sounds and intact distal pulses.   No murmur heard. Pulmonary/Chest: Effort normal and breath sounds normal. No respiratory distress. She has no wheezes.  Musculoskeletal: Normal range of motion. She exhibits no edema.       Right knee: Tenderness found. Medial joint line and patellar tendon tenderness noted.       Left knee: Tenderness found. Medial joint line and patellar tendon tenderness noted.  Neurological: She is alert and oriented to person, place, and time. Coordination  normal.  Skin: Skin is warm and dry. No rash noted. She is not diaphoretic.  Psychiatric: She has a normal mood and affect. Her behavior is normal.  Nursing note and vitals reviewed.  Knee injection2: Risk factors of bleeding and infection discussed with patient and patient is agreeable towards injection. Patient prepped with Betadine. Lateral approach towards injection used. Injected 80 mg of Depo-Medrol and 1 mL of 2% lidocaine. Patient tolerated procedure well and no side effects from noted.  Minimal to no bleeding. Simple bandage applied after.     Assessment & Plan:   Problem List Items Addressed This Visit      Cardiovascular and Mediastinum   Essential hypertension, benign   Relevant Medications   amLODipine (NORVASC) 5 MG tablet    Other Visit Diagnoses    Chronic pain of both knees    -  Primary   Relevant Medications   methylPREDNISolone acetate (DEPO-MEDROL) injection 80 mg   methylPREDNISolone acetate (DEPO-MEDROL) injection 80 mg       Follow up plan: Return in about 4 months (around 04/24/2017), or if symptoms worsen or fail to improve, for Follow-up knees and diabetes and hypertension.  Counseling provided for all of the vaccine components No orders of the defined types were placed in this encounter.   Arville Care, MD [facility] 12/23/2016, 2:25 PM

## 2016-12-26 ENCOUNTER — Encounter: Payer: Self-pay | Admitting: Family Medicine

## 2016-12-26 DIAGNOSIS — G8929 Other chronic pain: Secondary | ICD-10-CM

## 2016-12-26 DIAGNOSIS — M545 Low back pain: Principal | ICD-10-CM

## 2016-12-26 MED ORDER — MELOXICAM 7.5 MG PO TABS
15.0000 mg | ORAL_TABLET | Freq: Every day | ORAL | 2 refills | Status: DC
Start: 2016-12-26 — End: 2017-03-28

## 2016-12-27 ENCOUNTER — Other Ambulatory Visit: Payer: Self-pay | Admitting: Family Medicine

## 2016-12-27 DIAGNOSIS — I10 Essential (primary) hypertension: Secondary | ICD-10-CM

## 2017-01-20 ENCOUNTER — Other Ambulatory Visit: Payer: Self-pay | Admitting: Family Medicine

## 2017-01-20 DIAGNOSIS — F329 Major depressive disorder, single episode, unspecified: Secondary | ICD-10-CM

## 2017-01-20 DIAGNOSIS — F419 Anxiety disorder, unspecified: Principal | ICD-10-CM

## 2017-01-20 MED ORDER — ALPRAZOLAM 0.25 MG PO TABS: 0.2500 mg | ORAL_TABLET | Freq: Two times a day (BID) | ORAL | 2 refills | Status: DC | PRN

## 2017-01-20 NOTE — Progress Notes (Signed)
Patient was here today with her son and just wants a refill on alprazolam that she uses only as needed and get about 10 a month. Arville CareJoshua Graclynn Vanantwerp, MD [facility] 01/20/2017, 12:03 PM

## 2017-01-24 ENCOUNTER — Encounter: Payer: Self-pay | Admitting: Pediatrics

## 2017-01-24 DIAGNOSIS — K219 Gastro-esophageal reflux disease without esophagitis: Secondary | ICD-10-CM

## 2017-01-24 MED ORDER — ESOMEPRAZOLE MAGNESIUM 40 MG PO CPDR: 40.0000 mg | DELAYED_RELEASE_CAPSULE | Freq: Every day | ORAL | 1 refills | Status: DC

## 2017-01-28 ENCOUNTER — Encounter (HOSPITAL_COMMUNITY): Payer: Self-pay | Admitting: Emergency Medicine

## 2017-01-28 ENCOUNTER — Ambulatory Visit: Payer: Medicaid Other | Admitting: Family Medicine

## 2017-01-28 ENCOUNTER — Observation Stay (HOSPITAL_COMMUNITY)
Admission: EM | Admit: 2017-01-28 | Discharge: 2017-01-29 | Disposition: A | Discharge status: Home / Self Care | Payer: Medicaid Other | Attending: Family Medicine | Admitting: Family Medicine

## 2017-01-28 DIAGNOSIS — R110 Nausea: Secondary | ICD-10-CM | POA: Diagnosis not present

## 2017-01-28 DIAGNOSIS — Z79899 Other long term (current) drug therapy: Secondary | ICD-10-CM | POA: Insufficient documentation

## 2017-01-28 DIAGNOSIS — F329 Major depressive disorder, single episode, unspecified: Secondary | ICD-10-CM | POA: Diagnosis not present

## 2017-01-28 DIAGNOSIS — R42 Dizziness and giddiness: Secondary | ICD-10-CM | POA: Insufficient documentation

## 2017-01-28 DIAGNOSIS — E119 Type 2 diabetes mellitus without complications: Secondary | ICD-10-CM | POA: Diagnosis not present

## 2017-01-28 DIAGNOSIS — Z7984 Long term (current) use of oral hypoglycemic drugs: Secondary | ICD-10-CM | POA: Insufficient documentation

## 2017-01-28 DIAGNOSIS — K625 Hemorrhage of anus and rectum: Principal | ICD-10-CM | POA: Diagnosis present

## 2017-01-28 DIAGNOSIS — F419 Anxiety disorder, unspecified: Secondary | ICD-10-CM | POA: Diagnosis present

## 2017-01-28 DIAGNOSIS — I10 Essential (primary) hypertension: Secondary | ICD-10-CM | POA: Insufficient documentation

## 2017-01-28 DIAGNOSIS — F17210 Nicotine dependence, cigarettes, uncomplicated: Secondary | ICD-10-CM | POA: Insufficient documentation

## 2017-01-28 DIAGNOSIS — E1159 Type 2 diabetes mellitus with other circulatory complications: Secondary | ICD-10-CM | POA: Diagnosis present

## 2017-01-28 DIAGNOSIS — I152 Hypertension secondary to endocrine disorders: Secondary | ICD-10-CM | POA: Diagnosis present

## 2017-01-28 LAB — COMPREHENSIVE METABOLIC PANEL
ALK PHOS: 128 U/L — AB (ref 38–126)
ALT: 56 U/L — AB (ref 14–54)
AST: 35 U/L (ref 15–41)
Albumin: 3.8 g/dL (ref 3.5–5.0)
Anion gap: 8 (ref 5–15)
BUN: 9 mg/dL (ref 6–20)
CALCIUM: 8.9 mg/dL (ref 8.9–10.3)
CO2: 26 mmol/L (ref 22–32)
CREATININE: 0.75 mg/dL (ref 0.44–1.00)
Chloride: 103 mmol/L (ref 101–111)
Glucose, Bld: 210 mg/dL — ABNORMAL HIGH (ref 65–99)
Potassium: 3.9 mmol/L (ref 3.5–5.1)
SODIUM: 137 mmol/L (ref 135–145)
Total Bilirubin: 0.2 mg/dL — ABNORMAL LOW (ref 0.3–1.2)
Total Protein: 6.7 g/dL (ref 6.5–8.1)

## 2017-01-28 LAB — CBC WITH DIFFERENTIAL/PLATELET
Basophils Absolute: 0 10*3/uL (ref 0.0–0.1)
Basophils Relative: 0 %
EOS ABS: 0.3 10*3/uL (ref 0.0–0.7)
Eosinophils Relative: 2 %
HEMATOCRIT: 37.8 % (ref 36.0–46.0)
HEMOGLOBIN: 12.9 g/dL (ref 12.0–15.0)
LYMPHS ABS: 2.8 10*3/uL (ref 0.7–4.0)
LYMPHS PCT: 20 %
MCH: 30.8 pg (ref 26.0–34.0)
MCHC: 34.1 g/dL (ref 30.0–36.0)
MCV: 90.2 fL (ref 78.0–100.0)
MONOS PCT: 4 %
Monocytes Absolute: 0.5 10*3/uL (ref 0.1–1.0)
NEUTROS PCT: 74 %
Neutro Abs: 10.1 10*3/uL — ABNORMAL HIGH (ref 1.7–7.7)
Platelets: 252 10*3/uL (ref 150–400)
RBC: 4.19 MIL/uL (ref 3.87–5.11)
RDW: 13.8 % (ref 11.5–15.5)
WBC: 13.8 10*3/uL — ABNORMAL HIGH (ref 4.0–10.5)

## 2017-01-28 LAB — GLUCOSE, CAPILLARY: Glucose-Capillary: 230 mg/dL — ABNORMAL HIGH (ref 65–99)

## 2017-01-28 LAB — POC OCCULT BLOOD, ED: Fecal Occult Bld: POSITIVE — AB

## 2017-01-28 LAB — TYPE AND SCREEN
ABO/RH(D): A POS
ANTIBODY SCREEN: NEGATIVE

## 2017-01-28 LAB — CBG MONITORING, ED: GLUCOSE-CAPILLARY: 222 mg/dL — AB (ref 65–99)

## 2017-01-28 MED ORDER — SODIUM CHLORIDE 0.9 % IV SOLN
INTRAVENOUS | Status: DC
  Administered 2017-01-29: via INTRAVENOUS

## 2017-01-28 MED ORDER — CYCLOBENZAPRINE HCL 10 MG PO TABS: 10.0000 mg | ORAL_TABLET | Freq: Three times a day (TID) | ORAL | Status: DC | PRN

## 2017-01-28 MED ORDER — OMEGA-3-ACID ETHYL ESTERS 1 G PO CAPS
1.0000 g | ORAL_CAPSULE | Freq: Every day | ORAL | Status: DC
  Administered 2017-01-29: 1 g via ORAL
  Filled 2017-01-28: qty 1

## 2017-01-28 MED ORDER — ALPRAZOLAM 0.5 MG PO TABS
0.5000 mg | ORAL_TABLET | Freq: Three times a day (TID) | ORAL | Status: DC | PRN
  Administered 2017-01-29: 0.5 mg via ORAL
  Filled 2017-01-28: qty 1

## 2017-01-28 MED ORDER — ALBUTEROL SULFATE (2.5 MG/3ML) 0.083% IN NEBU: 3.0000 mL | INHALATION_SOLUTION | Freq: Four times a day (QID) | RESPIRATORY_TRACT | Status: DC | PRN

## 2017-01-28 MED ORDER — TRAZODONE HCL 50 MG PO TABS
50.0000 mg | ORAL_TABLET | Freq: Every evening | ORAL | Status: DC | PRN
  Administered 2017-01-29: 50 mg via ORAL
  Filled 2017-01-28: qty 1

## 2017-01-28 MED ORDER — INSULIN ASPART 100 UNIT/ML ~~LOC~~ SOLN
0.0000 [IU] | SUBCUTANEOUS | Status: DC
  Administered 2017-01-29: 2 [IU] via SUBCUTANEOUS
  Administered 2017-01-29: 3 [IU] via SUBCUTANEOUS
  Administered 2017-01-29: 2 [IU] via SUBCUTANEOUS
  Administered 2017-01-29: 5 [IU] via SUBCUTANEOUS

## 2017-01-28 MED ORDER — HYDROCORTISONE ACE-PRAMOXINE 1-1 % RE FOAM
1.0000 | Freq: Two times a day (BID) | RECTAL | Status: DC
  Filled 2017-01-28: qty 10

## 2017-01-28 MED ORDER — SERTRALINE HCL 50 MG PO TABS
200.0000 mg | ORAL_TABLET | Freq: Every day | ORAL | Status: DC
  Administered 2017-01-29: 200 mg via ORAL
  Filled 2017-01-28: qty 4

## 2017-01-28 MED ORDER — AMLODIPINE BESYLATE 5 MG PO TABS
5.0000 mg | ORAL_TABLET | Freq: Every day | ORAL | Status: DC
  Administered 2017-01-29: 5 mg via ORAL
  Filled 2017-01-28: qty 1

## 2017-01-28 MED ORDER — ONDANSETRON HCL 4 MG/2ML IJ SOLN: 4.0000 mg | Freq: Four times a day (QID) | INTRAMUSCULAR | Status: DC | PRN

## 2017-01-28 MED ORDER — ONDANSETRON HCL 4 MG PO TABS: 4.0000 mg | ORAL_TABLET | Freq: Four times a day (QID) | ORAL | Status: DC | PRN

## 2017-01-28 MED ORDER — OLANZAPINE 5 MG PO TABS
10.0000 mg | ORAL_TABLET | Freq: Every day | ORAL | Status: DC
  Administered 2017-01-29: 10 mg via ORAL
  Filled 2017-01-28: qty 2

## 2017-01-28 MED ORDER — LISINOPRIL 10 MG PO TABS
20.0000 mg | ORAL_TABLET | Freq: Every day | ORAL | Status: DC
  Administered 2017-01-29: 20 mg via ORAL
  Filled 2017-01-28: qty 2

## 2017-01-28 MED ORDER — PANTOPRAZOLE SODIUM 40 MG PO TBEC
40.0000 mg | DELAYED_RELEASE_TABLET | Freq: Every day | ORAL | Status: DC
  Administered 2017-01-29: 40 mg via ORAL
  Filled 2017-01-28: qty 1

## 2017-01-28 NOTE — ED Provider Notes (Signed)
[facility]-EMERGENCY DEPT Provider Note   CSN: [id number] Arrival date & time: 01/28/17  1558     History   Chief Complaint Chief Complaint  Patient presents with  . Rectal Bleeding    HPI Heidi [name] is a 26 y.o. female.  HPI Patient presents with gross red blood per rectum. States she's had multiple episodes today. States that she fills the toilet with blood when she goes to the bathroom. No associated pain. Has some lightheadedness and nausea when she is passing blood. Denies any syncope. States was bright red. There are no clots. States she is not been evaluated by a gastroenterology's. No fever or chills. Denies urinary or vaginal symptoms. Past Medical History:  Diagnosis Date  . Abdominal pain 01/10/2016  . Anxiety   . Back pain   . Bloating 01/10/2016  . Depression   . GERD (gastroesophageal reflux disease)   . Hematuria 01/10/2016  . Herpes simplex virus (HSV) infection   . Hypertension   . Neuropathy    idopathic  . Osteoarthritis   . Prediabetes   . Schizo-affective schizophrenia (HCC)   . Type 2 diabetes mellitus (HCC) 03/28/2016  . Urinary frequency 01/10/2016    Patient Active Problem List   Diagnosis Date Noted  . Rectal bleed 01/28/2017  . Type 2 diabetes mellitus (HCC) 03/28/2016  . Paresthesia 01/18/2016  . Neuropathic pain 11/10/2015  . Essential hypertension, benign 07/21/2015  . Anxiety and depression 06/22/2015    Past Surgical History:  Procedure Laterality Date  . CESAREAN SECTION    . DILATION AND CURETTAGE OF UTERUS    . ESSURE TUBAL LIGATION    . TUBAL LIGATION      OB History    Gravida Para Term Preterm AB Living   2 1     1 1    SAB TAB Ectopic Multiple Live Births   1               Home Medications    Prior to Admission medications   Medication Sig Start Date End Date Taking? Authorizing Provider  albuterol (PROVENTIL HFA;VENTOLIN HFA) 108 (90 Base) MCG/ACT inhaler Inhale 2 puffs into the lungs every 6 (six) hours as needed  for wheezing or shortness of breath. 02/12/16  Yes Dettinger, Elige RadonJoshua A, MD  ALPRAZolam Prudy Feeler(XANAX) 0.25 MG tablet Take 1 tablet (0.25 mg total) by mouth 2 (two) times daily as needed for anxiety. 01/20/17  Yes Dettinger, Elige RadonJoshua A, MD  amLODipine (NORVASC) 5 MG tablet Take 1 tablet (5 mg total) by mouth daily. 12/23/16  Yes Dettinger, Elige RadonJoshua A, MD  cholecalciferol (VITAMIN D) 1000 units tablet Take 1,000 Units by mouth daily.   Yes [provider]  cyclobenzaprine (FLEXERIL) 10 MG tablet Take 1 tablet (10 mg total) by mouth 3 (three) times daily as needed for muscle spasms. 11/25/16  Yes Dettinger, Elige RadonJoshua A, MD  esomeprazole (NEXIUM) 40 MG capsule Take 1 capsule (40 mg total) by mouth daily. 01/24/17  Yes Johna SheriffVincent, Carol L, MD  lisinopril (PRINIVIL,ZESTRIL) 20 MG tablet TAKE ONE TABLET BY MOUTH ONCE DAILY 12/30/16  Yes Dettinger, Elige RadonJoshua A, MD  meloxicam (MOBIC) 7.5 MG tablet Take 2 tablets (15 mg total) by mouth daily. Patient taking differently: Take 15 mg by mouth at bedtime.  12/26/16  Yes Dettinger, Elige RadonJoshua A, MD  metFORMIN (GLUCOPHAGE) 500 MG tablet Take 1 tablet (500 mg total) by mouth 2 (two) times daily with a meal. Patient taking differently: Take 500 mg by mouth daily with breakfast.  04/01/16  Yes Dettinger, Elige Radon, MD  milk thistle 175 MG tablet Take 175 mg by mouth daily.   Yes [provider]  OLANZapine (ZYPREXA) 10 MG tablet Take 1 tablet (10 mg total) by mouth at bedtime. 10/23/16  Yes Dettinger, Elige Radon, MD  Omega-3 Fatty Acids (FISH OIL) 1000 MG CAPS Take 1,000 mg by mouth at bedtime.    Yes [provider]  sertraline (ZOLOFT) 100 MG tablet Take 2 tablets (200 mg total) by mouth at bedtime. 10/23/16  Yes Dettinger, Elige Radon, MD  traZODone (DESYREL) 50 MG tablet Take 50 mg by mouth at bedtime as needed for sleep.   Yes [provider]  triamcinolone cream (KENALOG) 0.1 % Apply 1 application topically 2 (two) times daily. Patient taking differently: Apply 1  application topically daily as needed (for rash-skin irritation).  08/08/16  Yes Dettinger, Elige Radon, MD  Turmeric 500 MG CAPS Take 1,500 Units by mouth daily.    Yes [provider]  glucose blood (ACCU-CHEK GUIDE) test strip Use to check BG once daily at varying times of day. 03/28/16   Henrene Pastor, PharmD  Lancets 30G MISC Dispense Accu-Chek guide lancets.  Check blood sugar at varying times once per day. 04/01/16   Dettinger, Elige Radon, MD    Family History Family History  Problem Relation Age of Onset  . Depression Mother   . Hypertension Mother   . Diabetes Mother   . Mental illness Father   . Hypertension Father   . Diabetes Father   . Cancer Maternal Aunt   . Diabetes Paternal Grandmother   . Hypertension Paternal Grandmother   . Arthritis Paternal Grandmother   . Stroke Paternal Grandfather   . Heart disease Paternal Grandfather   . Hypertension Paternal Grandfather   . Diabetes Paternal Grandfather   . Schizophrenia Maternal Grandfather   . Alcohol abuse Maternal Grandfather   . Other Sister        recovering addict  . Schizophrenia Sister   . Irritable bowel syndrome Sister   . Other Sister        recovering addict  . Bipolar disorder Sister   . Seizures Sister     Social History Social History  Substance Use Topics  . Smoking status: Current Every Day Smoker    Packs/day: 1.00    Years: 10.00    Types: Cigarettes  . Smokeless tobacco: Never Used  . Alcohol use 0.0 oz/week     Comment: Occasionally      Allergies   Hydroxyzine; Lorazepam; Morphine and related; and Quetiapine   Review of Systems Review of Systems  Constitutional: Negative for chills and fever.  HENT: Negative for trouble swallowing and voice change.   Respiratory: Negative for cough and shortness of breath.   Cardiovascular: Negative for chest pain.  Gastrointestinal: Positive for blood in stool and nausea. Negative for abdominal pain, constipation, diarrhea and vomiting.    Genitourinary: Negative for dysuria, flank pain, frequency, hematuria and pelvic pain.  Musculoskeletal: Negative for back pain, myalgias, neck pain and neck stiffness.  Neurological: Positive for dizziness and light-headedness. Negative for weakness, numbness and headaches.  All other systems reviewed and are negative.    Physical Exam Updated Vital Signs BP 104/65 (BP Location: Right Arm)   Pulse 77   Temp 98.9 F (37.2 C) (Oral)   Resp 19   Ht 5\' 8"  (1.727 m)   Wt (!) 142.9 kg (314 lb 15.5 oz)   LMP 01/21/2017   SpO2 97%  BMI 47.89 kg/m   Physical Exam  Constitutional: She is oriented to person, place, and time. She appears well-developed and well-nourished. No distress.  HENT:  Head: Normocephalic and atraumatic.  Mouth/Throat: Oropharynx is clear and moist. No oropharyngeal exudate.  Eyes: EOM are normal. Pupils are equal, round, and reactive to light.  Neck: Normal range of motion. Neck supple.  Cardiovascular: Normal rate and regular rhythm.  Exam reveals no gallop and no friction rub.   No murmur heard. Pulmonary/Chest: Effort normal and breath sounds normal. No respiratory distress. She has no wheezes. She has no rales. She exhibits no tenderness.  Abdominal: Soft. Bowel sounds are normal. She exhibits no distension and no mass. There is no tenderness. There is no rebound and no guarding. No hernia.  Genitourinary: Rectal exam shows guaiac positive stool.  Genitourinary Comments: Gross red blood on rectal exam. Patient does have some external hemorrhoids but no obvious bleeding. Mild tenderness to palpation no masses appreciated. No obvious rectal fissures.  Musculoskeletal: Normal range of motion. She exhibits no edema or tenderness.  Neurological: She is alert and oriented to person, place, and time.  Moving all extremities without focal deficit. Sensation fully intact.  Skin: Skin is warm and dry. No rash noted. No erythema.  Psychiatric: She has a normal mood and  affect. Her behavior is normal.  Nursing note and vitals reviewed.    ED Treatments / Results  Labs (all labs ordered are listed, but only abnormal results are displayed) Labs Reviewed  COMPREHENSIVE METABOLIC PANEL - Abnormal; Notable for the following:       Result Value   Glucose, Bld 210 (*)    ALT 56 (*)    Alkaline Phosphatase 128 (*)    Total Bilirubin 0.2 (*)    All other components within normal limits  CBC WITH DIFFERENTIAL/PLATELET - Abnormal; Notable for the following:    WBC 13.8 (*)    Neutro Abs 10.1 (*)    All other components within normal limits  CBC WITH DIFFERENTIAL/PLATELET - Abnormal; Notable for the following:    WBC 16.2 (*)    Neutro Abs 11.1 (*)    All other components within normal limits  CBC WITH DIFFERENTIAL/PLATELET - Abnormal; Notable for the following:    WBC 11.4 (*)    RBC 3.67 (*)    Hemoglobin 11.5 (*)    HCT 33.3 (*)    All other components within normal limits  CBC WITH DIFFERENTIAL/PLATELET - Abnormal; Notable for the following:    WBC 10.9 (*)    RBC 3.75 (*)    Hemoglobin 11.5 (*)    HCT 34.0 (*)    All other components within normal limits  GLUCOSE, CAPILLARY - Abnormal; Notable for the following:    Glucose-Capillary 230 (*)    All other components within normal limits  GLUCOSE, CAPILLARY - Abnormal; Notable for the following:    Glucose-Capillary 160 (*)    All other components within normal limits  GLUCOSE, CAPILLARY - Abnormal; Notable for the following:    Glucose-Capillary 146 (*)    All other components within normal limits  GLUCOSE, CAPILLARY - Abnormal; Notable for the following:    Glucose-Capillary 145 (*)    All other components within normal limits  POC OCCULT BLOOD, ED - Abnormal; Notable for the following:    Fecal Occult Bld POSITIVE (*)    All other components within normal limits  CBG MONITORING, ED - Abnormal; Notable for the following:    Glucose-Capillary 222 (*)  All other components within normal  limits  HIV ANTIBODY (ROUTINE TESTING)  TYPE AND SCREEN    EKG  EKG Interpretation None       Radiology No results found.  Procedures Procedures (including critical care time)  Medications Ordered in ED Medications - No data to display   Initial Impression / Assessment and Plan / ED Course  I have reviewed the triage vital signs and the nursing notes.  Pertinent labs & imaging results that were available during my care of the patient were reviewed by me and considered in my medical decision making (see chart for details).     Vital signs remained stable. Patient reports having 2 bloody bowel movements while in the emergency department. Hemoglobin is stable. Due to concern about the amount of blood that the patient is passing, discussed with hospitalist and will admit for observation.  Final Clinical Impressions(s) / ED Diagnoses   Final diagnoses:  Rectal bleed    New Prescriptions Discharge Medication List as of 01/29/2017  2:55 PM       Loren Racer, MD 01/31/17 1324

## 2017-01-28 NOTE — ED Triage Notes (Signed)
BM with bright red bleeding x 2 today. C/o lower abd pain and rectal pain. Color wnl. nad at this time

## 2017-01-28 NOTE — H&P (Signed)
History and Physical    Heidi [name] ZOX:[id number] DOB: 03/04/1991 DOA: 01/28/2017  PCP: Dettinger, Elige Radon, MD  Patient coming from: Home.    Chief Complaint:   Rectal bleeding.   HPI: Heidi [name] is an 26 y.o. female with hx of anxiety and depression, schizo-affective disorder, type II DM, HTN, GERD, hx of known hemorrhoid with prior rectal bleed, presented to the ER with rectal bleeding.  She has no clots, abdominal pain, lightheadedness, nausea, vomiting or bloody diarrhea.  Evaluation in the ER showed normal hemodynamics, Hb of 12.3 g per dL, and BUN of 9.  EDP performed rectal exam which confirmed blood and hemorrhoids.    ED Course:  See above.  Rewiew of Systems:  Constitutional: Negative for malaise, fever and chills. No significant weight loss or weight gain Eyes: Negative for eye pain, redness and discharge, diplopia, visual changes, or flashes of light. ENMT: Negative for ear pain, hoarseness, nasal congestion, sinus pressure and sore throat. No headaches; tinnitus, drooling, or problem swallowing. Cardiovascular: Negative for chest pain, palpitations, diaphoresis, dyspnea and peripheral edema. ; No orthopnea, PND Respiratory: Negative for cough, hemoptysis, wheezing and stridor. No pleuritic chestpain. Gastrointestinal: Negative for diarrhea, constipation,  melena, hematemesis, jaundice and rectal bleeding.    Genitourinary: Negative for frequency, dysuria, incontinence,flank pain and hematuria; Musculoskeletal: Negative for back pain and neck pain. Negative for swelling and trauma.;  Skin: . Negative for pruritus, rash, abrasions, bruising and skin lesion.; ulcerations Neuro: Negative for headache, lightheadedness and neck stiffness. Negative for weakness, altered level of consciousness , altered mental status, extremity weakness, burning feet, involuntary movement, seizure and syncope.  Psych: negative for anxiety, depression, insomnia, tearfulness, panic attacks,  hallucinations, paranoia, suicidal or homicidal ideation   Past Medical History:  Diagnosis Date  . Abdominal pain 01/10/2016  . Anxiety   . Back pain   . Bloating 01/10/2016  . Depression   . GERD (gastroesophageal reflux disease)   . Hematuria 01/10/2016  . Herpes simplex virus (HSV) infection   . Hypertension   . Neuropathy    idopathic  . Osteoarthritis   . Prediabetes   . Schizo-affective schizophrenia (HCC)   . Type 2 diabetes mellitus (HCC) 03/28/2016  . Urinary frequency 01/10/2016    Past Surgical History:  Procedure Laterality Date  . CESAREAN SECTION    . DILATION AND CURETTAGE OF UTERUS    . ESSURE TUBAL LIGATION    . TUBAL LIGATION       reports that she has been smoking Cigarettes.  She has a 10.00 pack-year smoking history. She has never used smokeless tobacco. She reports that she drinks alcohol. She reports that she uses drugs, including Marijuana.  Allergies  Allergen Reactions  . Hydroxyzine     Causes her more anxiety.  . Lorazepam     Sedation  . Morphine And Related Other (See Comments)    Makes pt.aggressive  . Quetiapine     Sweating, irritability    Family History  Problem Relation Age of Onset  . Depression Mother   . Hypertension Mother   . Diabetes Mother   . Mental illness Father   . Hypertension Father   . Diabetes Father   . Cancer Maternal Aunt   . Diabetes Paternal Grandmother   . Hypertension Paternal Grandmother   . Arthritis Paternal Grandmother   . Stroke Paternal Grandfather   . Heart disease Paternal Grandfather   . Hypertension Paternal Grandfather   . Diabetes Paternal Grandfather   . Schizophrenia Maternal  Grandfather   . Alcohol abuse Maternal Grandfather   . Other Sister        recovering addict  . Schizophrenia Sister   . Irritable bowel syndrome Sister   . Other Sister        recovering addict  . Bipolar disorder Sister   . Seizures Sister      Prior to Admission medications   Medication Sig Start Date  End Date Taking? Authorizing Provider  albuterol (PROVENTIL HFA;VENTOLIN HFA) 108 (90 Base) MCG/ACT inhaler Inhale 2 puffs into the lungs every 6 (six) hours as needed for wheezing or shortness of breath. 02/12/16  Yes Dettinger, Elige Radon, MD  ALPRAZolam Prudy Feeler) 0.25 MG tablet Take 1 tablet (0.25 mg total) by mouth 2 (two) times daily as needed for anxiety. 01/20/17  Yes Dettinger, Elige Radon, MD  amLODipine (NORVASC) 5 MG tablet Take 1 tablet (5 mg total) by mouth daily. 12/23/16  Yes Dettinger, Elige Radon, MD  cholecalciferol (VITAMIN D) 1000 units tablet Take 1,000 Units by mouth daily.   Yes [provider]  cyclobenzaprine (FLEXERIL) 10 MG tablet Take 1 tablet (10 mg total) by mouth 3 (three) times daily as needed for muscle spasms. 11/25/16  Yes Dettinger, Elige Radon, MD  esomeprazole (NEXIUM) 40 MG capsule Take 1 capsule (40 mg total) by mouth daily. 01/24/17  Yes Johna Sheriff, MD  lisinopril (PRINIVIL,ZESTRIL) 20 MG tablet TAKE ONE TABLET BY MOUTH ONCE DAILY 12/30/16  Yes Dettinger, Elige Radon, MD  meloxicam (MOBIC) 7.5 MG tablet Take 2 tablets (15 mg total) by mouth daily. Patient taking differently: Take 15 mg by mouth at bedtime.  12/26/16  Yes Dettinger, Elige Radon, MD  metFORMIN (GLUCOPHAGE) 500 MG tablet Take 1 tablet (500 mg total) by mouth 2 (two) times daily with a meal. Patient taking differently: Take 500 mg by mouth daily with breakfast.  04/01/16  Yes Dettinger, Elige Radon, MD  milk thistle 175 MG tablet Take 175 mg by mouth daily.   Yes [provider]  OLANZapine (ZYPREXA) 10 MG tablet Take 1 tablet (10 mg total) by mouth at bedtime. 10/23/16  Yes Dettinger, Elige Radon, MD  Omega-3 Fatty Acids (FISH OIL) 1000 MG CAPS Take 1,000 mg by mouth at bedtime.    Yes [provider]  sertraline (ZOLOFT) 100 MG tablet Take 2 tablets (200 mg total) by mouth at bedtime. 10/23/16  Yes Dettinger, Elige Radon, MD  traZODone (DESYREL) 50 MG tablet Take 50 mg by mouth at bedtime as needed  for sleep.   Yes [provider]  triamcinolone cream (KENALOG) 0.1 % Apply 1 application topically 2 (two) times daily. Patient taking differently: Apply 1 application topically daily as needed (for rash-skin irritation).  08/08/16  Yes Dettinger, Elige Radon, MD  Turmeric 500 MG CAPS Take 1,500 Units by mouth daily.    Yes [provider]  glucose blood (ACCU-CHEK GUIDE) test strip Use to check BG once daily at varying times of day. 03/28/16   Henrene Pastor, PharmD  Lancets 30G MISC Dispense Accu-Chek guide lancets.  Check blood sugar at varying times once per day. 04/01/16   Dettinger, Elige Radon, MD    Physical Exam: Vitals:   01/28/17 1611 01/28/17 1612 01/28/17 1929 01/28/17 2014  BP:  134/68 114/67 112/78  Pulse:  95 90 89  Resp:  20 19 18   Temp:  98.2 F (36.8 C)    TempSrc:  Oral    SpO2:  100% 98% 99%  Weight: (!) 142.9  kg (315 lb)     Height: 5\' 8"  (1.727 m)         Constitutional: NAD, calm, comfortable Vitals:   01/28/17 1611 01/28/17 1612 01/28/17 1929 01/28/17 2014  BP:  134/68 114/67 112/78  Pulse:  95 90 89  Resp:  20 19 18   Temp:  98.2 F (36.8 C)    TempSrc:  Oral    SpO2:  100% 98% 99%  Weight: (!) 142.9 kg (315 lb)     Height: 5\' 8"  (1.727 m)      Eyes: PERRL, lids and conjunctivae normal ENMT: Mucous membranes are moist. Posterior pharynx clear of any exudate or lesions.Normal dentition.  Neck: normal, supple, no masses, no thyromegaly Respiratory: clear to auscultation bilaterally, no wheezing, no crackles. Normal respiratory effort. No accessory muscle use.  Cardiovascular: Regular rate and rhythm, no murmurs / rubs / gallops. No extremity edema. 2+ pedal pulses. No carotid bruits.  Abdomen: no tenderness, no masses palpated. No hepatosplenomegaly. Bowel sounds positive.  Musculoskeletal: no clubbing / cyanosis. No joint deformity upper and lower extremities. Good ROM, no contractures. Normal muscle tone.  Skin: no rashes, lesions, ulcers.  No induration Neurologic: CN 2-12 grossly intact. Sensation intact, DTR normal. Strength 5/5 in all 4.  Psychiatric: Normal judgment and insight. Alert and oriented x 3. Normal mood.    Labs on Admission: I have personally reviewed following labs and imaging studies  CBC:  Recent Labs Lab 01/28/17 1637  WBC 13.8*  NEUTROABS 10.1*  HGB 12.9  HCT 37.8  MCV 90.2  PLT 252   Basic Metabolic Panel:  Recent Labs Lab 01/28/17 1637  NA 137  K 3.9  CL 103  CO2 26  GLUCOSE 210*  BUN 9  CREATININE 0.75  CALCIUM 8.9   GFR: Estimated Creatinine Clearance: 159.2 mL/min (by C-G formula based on SCr of 0.75 mg/dL). Liver Function Tests:  Recent Labs Lab 01/28/17 1637  AST 35  ALT 56*  ALKPHOS 128*  BILITOT 0.2*  PROT 6.7  ALBUMIN 3.8   CBG:  Recent Labs Lab 01/28/17 1617  GLUCAP 222*   Urine analysis:    Component Value Date/Time   COLORURINE YELLOW 11/15/2016 2248   APPEARANCEUR CLEAR 11/15/2016 2248   APPEARANCEUR Clear 01/10/2016 1500   LABSPEC 1.010 11/15/2016 2248   PHURINE 7.0 11/15/2016 2248   GLUCOSEU NEGATIVE 11/15/2016 2248   HGBUR NEGATIVE 11/15/2016 2248   BILIRUBINUR NEGATIVE 11/15/2016 2248   BILIRUBINUR Negative 01/10/2016 1500   KETONESUR NEGATIVE 11/15/2016 2248   PROTEINUR NEGATIVE 11/15/2016 2248   UROBILINOGEN 0.2 05/16/2015 2351   NITRITE NEGATIVE 11/15/2016 2248   LEUKOCYTESUR NEGATIVE 11/15/2016 2248   LEUKOCYTESUR Negative 01/10/2016 1500   Radiological Exams on Admission: No results found.  EKG: Independently reviewed.   Assessment/Plan Active Problems:   Anxiety and depression   Essential hypertension, benign   Type 2 diabetes mellitus (HCC)   Rectal bleed    PLAN:   Rectal bleeding:  Likely from hemorrhoidal bleed.  Dr Ranae PalmsYelverton felt that it is prudent to admit her OBS, so I complied.  Will check H and H every 6-8 hours, give IVF, and Tx her with proctofoam.   Anxiety and schizoaffective disorder:  Will continue her  psych meds, and give 0.5 mg of Ativan TID PRN.  DM:  Give clear liquid.  SSI as required.  Hold metformin.   DVT prophylaxis: SCD.  Code Status: FULL CODE.  Family Communication: bf at bedside.  Disposition Plan: Home.  Consults called:  None.  Admission status: OBS.    Lana Flaim MD FACP. [facility]  If 7PM-7AM, please contact night-coverage [email or web address] Password [facility]  01/28/2017, 10:55 PM

## 2017-01-28 NOTE — ED Notes (Signed)
Pt loudly crying from outside the room. This tech entered pt's room and pt was thrashing around in bed. When asked what was wrong the pt screamed that she was anxious and tired of not being seen, stating that she had been in here for 2 hours and she was scared. The pt turned around in bed and swung at this tech. Security called and is currently in pt's room. RN Lorn Juneshristopher Winningham notified

## 2017-01-29 DIAGNOSIS — E119 Type 2 diabetes mellitus without complications: Secondary | ICD-10-CM

## 2017-01-29 DIAGNOSIS — I10 Essential (primary) hypertension: Secondary | ICD-10-CM | POA: Diagnosis not present

## 2017-01-29 DIAGNOSIS — F329 Major depressive disorder, single episode, unspecified: Secondary | ICD-10-CM

## 2017-01-29 DIAGNOSIS — F419 Anxiety disorder, unspecified: Secondary | ICD-10-CM

## 2017-01-29 DIAGNOSIS — K625 Hemorrhage of anus and rectum: Secondary | ICD-10-CM | POA: Diagnosis not present

## 2017-01-29 LAB — CBC WITH DIFFERENTIAL/PLATELET
BASOS ABS: 0 10*3/uL (ref 0.0–0.1)
BASOS PCT: 0 %
BASOS PCT: 0 %
BASOS PCT: 0 %
Basophils Absolute: 0 10*3/uL (ref 0.0–0.1)
Basophils Absolute: 0 10*3/uL (ref 0.0–0.1)
EOS ABS: 0.2 10*3/uL (ref 0.0–0.7)
EOS ABS: 0.3 10*3/uL (ref 0.0–0.7)
EOS PCT: 2 %
Eosinophils Absolute: 0.3 10*3/uL (ref 0.0–0.7)
Eosinophils Relative: 2 %
Eosinophils Relative: 3 %
HCT: 33.3 % — ABNORMAL LOW (ref 36.0–46.0)
HCT: 34 % — ABNORMAL LOW (ref 36.0–46.0)
HEMATOCRIT: 36.2 % (ref 36.0–46.0)
HEMOGLOBIN: 11.5 g/dL — AB (ref 12.0–15.0)
HEMOGLOBIN: 11.5 g/dL — AB (ref 12.0–15.0)
Hemoglobin: 12.4 g/dL (ref 12.0–15.0)
Lymphocytes Relative: 25 %
Lymphocytes Relative: 28 %
Lymphocytes Relative: 21 %
Lymphs Abs: 4 10*3/uL (ref 0.7–4.0)
Lymphs Abs: 3.2 10*3/uL (ref 0.7–4.0)
Lymphs Abs: 2.3 10*3/uL (ref 0.7–4.0)
MCH: 30.9 pg (ref 26.0–34.0)
MCH: 31.3 pg (ref 26.0–34.0)
MCH: 30.7 pg (ref 26.0–34.0)
MCHC: 34.3 g/dL (ref 30.0–36.0)
MCHC: 34.5 g/dL (ref 30.0–36.0)
MCHC: 33.8 g/dL (ref 30.0–36.0)
MCV: 90.3 fL (ref 78.0–100.0)
MCV: 90.7 fL (ref 78.0–100.0)
MCV: 90.7 fL (ref 78.0–100.0)
MONO ABS: 0.6 10*3/uL (ref 0.1–1.0)
MONOS PCT: 4 %
Monocytes Absolute: 0.7 10*3/uL (ref 0.1–1.0)
Monocytes Absolute: 0.6 10*3/uL (ref 0.1–1.0)
Monocytes Relative: 5 %
Monocytes Relative: 5 %
NEUTROS ABS: 11.1 10*3/uL — AB (ref 1.7–7.7)
NEUTROS ABS: 7.4 10*3/uL (ref 1.7–7.7)
NEUTROS PCT: 65 %
NEUTROS PCT: 71 %
Neutro Abs: 7.7 10*3/uL (ref 1.7–7.7)
Neutrophils Relative %: 69 %
Platelets: 286 10*3/uL (ref 150–400)
Platelets: 240 10*3/uL (ref 150–400)
Platelets: 234 10*3/uL (ref 150–400)
RBC: 4.01 MIL/uL (ref 3.87–5.11)
RBC: 3.67 MIL/uL — ABNORMAL LOW (ref 3.87–5.11)
RBC: 3.75 MIL/uL — AB (ref 3.87–5.11)
RDW: 13.8 % (ref 11.5–15.5)
RDW: 13.9 % (ref 11.5–15.5)
RDW: 14 % (ref 11.5–15.5)
WBC: 16.2 10*3/uL — ABNORMAL HIGH (ref 4.0–10.5)
WBC: 11.4 10*3/uL — ABNORMAL HIGH (ref 4.0–10.5)
WBC: 10.9 10*3/uL — AB (ref 4.0–10.5)

## 2017-01-29 LAB — GLUCOSE, CAPILLARY
GLUCOSE-CAPILLARY: 160 mg/dL — AB (ref 65–99)
GLUCOSE-CAPILLARY: 146 mg/dL — AB (ref 65–99)
Glucose-Capillary: 145 mg/dL — ABNORMAL HIGH (ref 65–99)

## 2017-01-29 NOTE — Discharge Summary (Signed)
Physician Discharge Summary  Heidi MyrtleJessica Leppanen FAO:[id number] DOB: 12/14/1990 DOA: 01/28/2017  PCP: Dettinger, Elige RadonJoshua A, MD  Admit date: 01/28/2017 Discharge date: 01/29/2017  Admitted From: Home  Disposition:  Home   Recommendations for Outpatient Follow-up:  1. Follow up with PCP in 1-2 weeks 2. Schedule follow up with gastroenterology (referral placed) 3. Please obtain BMP/CBC in one week  Home Health: No  Equipment/Devices: None   Discharge Condition: Stable CODE STATUS: Full Code Diet recommendation:  Carb Modified   Brief/Interim Summary: Heidi [name] is an 26 y.o. female with hx of anxiety and depression, schizo-affective disorder, type II DM, HTN, GERD, hx of known hemorrhoid with prior rectal bleed, presented to the ER with rectal bleeding.  She has no clots, abdominal pain, lightheadedness, nausea, vomiting or bloody diarrhea.  Evaluation in the ER showed normal hemodynamics, Hb of 12.3 g per dL, and BUN of 9.  EDP performed rectal exam which confirmed blood and hemorrhoids.  She was placed in observation and did not have any further bleeding.  Her H/H was stable at time of discharge.  She is requesting an outpatient follow up with Gastroenterology to further investigate her bleeding.  She denied anusol suppositories at time of discharge.  She was counseled on increasing her fiber intake as well as her water intake to help with constipation that she states she has from time to time.    Discharge Diagnoses:  Active Problems:   Anxiety and depression   Essential hypertension, benign   Type 2 diabetes mellitus (HCC)   Rectal bleed    Discharge Instructions  Discharge Instructions    Ambulatory referral to Gastroenterology    Complete by:  As directed    Call MD for:  difficulty breathing, headache or visual disturbances    Complete by:  As directed    Call MD for:  extreme fatigue    Complete by:  As directed    Call MD for:  hives    Complete by:  As directed    Call MD  for:  persistant dizziness or light-headedness    Complete by:  As directed    Call MD for:  persistant nausea and vomiting    Complete by:  As directed    Call MD for:  severe uncontrolled pain    Complete by:  As directed    Call MD for:  temperature >100.4    Complete by:  As directed    Diet - low sodium heart healthy    Complete by:  As directed    Increase activity slowly    Complete by:  As directed      Allergies as of 01/29/2017      Reactions   Hydroxyzine    Causes her more anxiety.   Lorazepam    Sedation   Morphine And Related Other (See Comments)   Makes pt.aggressive   Quetiapine    Sweating, irritability      Medication List    TAKE these medications   albuterol 108 (90 Base) MCG/ACT inhaler Commonly known as:  PROVENTIL HFA;VENTOLIN HFA Inhale 2 puffs into the lungs every 6 (six) hours as needed for wheezing or shortness of breath.   ALPRAZolam 0.25 MG tablet Commonly known as:  XANAX Take 1 tablet (0.25 mg total) by mouth 2 (two) times daily as needed for anxiety.   amLODipine 5 MG tablet Commonly known as:  NORVASC Take 1 tablet (5 mg total) by mouth daily.   cholecalciferol 1000 units tablet Commonly  known as:  VITAMIN D Take 1,000 Units by mouth daily.   cyclobenzaprine 10 MG tablet Commonly known as:  FLEXERIL Take 1 tablet (10 mg total) by mouth 3 (three) times daily as needed for muscle spasms.   esomeprazole 40 MG capsule Commonly known as:  NEXIUM Take 1 capsule (40 mg total) by mouth daily.   Fish Oil 1000 MG Caps Take 1,000 mg by mouth at bedtime.   glucose blood test strip Commonly known as:  ACCU-CHEK GUIDE Use to check BG once daily at varying times of day.   Lancets 30G Misc Dispense Accu-Chek guide lancets.  Check blood sugar at varying times once per day.   lisinopril 20 MG tablet Commonly known as:  PRINIVIL,ZESTRIL TAKE ONE TABLET BY MOUTH ONCE DAILY   meloxicam 7.5 MG tablet Commonly known as:  MOBIC Take 2  tablets (15 mg total) by mouth daily. What changed:  when to take this   metFORMIN 500 MG tablet Commonly known as:  GLUCOPHAGE Take 1 tablet (500 mg total) by mouth 2 (two) times daily with a meal. What changed:  when to take this   milk thistle 175 MG tablet Take 175 mg by mouth daily.   OLANZapine 10 MG tablet Commonly known as:  ZYPREXA Take 1 tablet (10 mg total) by mouth at bedtime.   sertraline 100 MG tablet Commonly known as:  ZOLOFT Take 2 tablets (200 mg total) by mouth at bedtime.   traZODone 50 MG tablet Commonly known as:  DESYREL Take 50 mg by mouth at bedtime as needed for sleep.   triamcinolone cream 0.1 % Commonly known as:  KENALOG Apply 1 application topically 2 (two) times daily. What changed:  when to take this  reasons to take this   Turmeric 500 MG Caps Take 1,500 Units by mouth daily.       Allergies  Allergen Reactions  . Hydroxyzine     Causes her more anxiety.  . Lorazepam     Sedation  . Morphine And Related Other (See Comments)    Makes pt.aggressive  . Quetiapine     Sweating, irritability    Consultations:  None   Procedures/Studies:  No results found.    Subjective: Patient reports that she has not had any other bowel movements since time of admission.  Does report that she has had an issue with constipation previously.  Discharge Exam: Vitals:   01/29/17 0621 01/29/17 1355  BP: 118/87 104/65  Pulse: 80 77  Resp: 20 19  Temp: 98.2 F (36.8 C) 98.9 F (37.2 C)   Vitals:   01/28/17 2353 01/29/17 0006 01/29/17 0621 01/29/17 1355  BP: 110/82  118/87 104/65  Pulse: 87  80 77  Resp:   20 19  Temp: 98.8 F (37.1 C)  98.2 F (36.8 C) 98.9 F (37.2 C)  TempSrc: Oral  Oral Oral  SpO2: 100%  100% 97%  Weight:  (!) 142.9 kg (314 lb 15.5 oz)    Height:  5\' 8"  (1.727 m)      General: Pt is alert, awake, not in acute distress Cardiovascular: RRR, S1/S2 +, no rubs, no gallops Respiratory: CTA bilaterally, no  wheezing, no rhonchi Abdominal: Soft, obese, NT, ND, bowel sounds + Extremities: no edema, no cyanosis    The results of significant diagnostics from this hospitalization (including imaging, microbiology, ancillary and laboratory) are listed below for reference.     Microbiology: No results found for this or any previous visit (from the past  240 hour(s)).   Labs: BNP (last 3 results) No results for input(s): BNP in the last 8760 hours. Basic Metabolic Panel:  Recent Labs Lab 01/28/17 1637  NA 137  K 3.9  CL 103  CO2 26  GLUCOSE 210*  BUN 9  CREATININE 0.75  CALCIUM 8.9   Liver Function Tests:  Recent Labs Lab 01/28/17 1637  AST 35  ALT 56*  ALKPHOS 128*  BILITOT 0.2*  PROT 6.7  ALBUMIN 3.8   No results for input(s): LIPASE, AMYLASE in the last 168 hours. No results for input(s): AMMONIA in the last 168 hours. CBC:  Recent Labs Lab 01/28/17 1637 01/29/17 0113 01/29/17 0541 01/29/17 1114  WBC 13.8* 16.2* 11.4* 10.9*  NEUTROABS 10.1* 11.1* 7.4 7.7  HGB 12.9 12.4 11.5* 11.5*  HCT 37.8 36.2 33.3* 34.0*  MCV 90.2 90.3 90.7 90.7  PLT 252 286 240 234   Cardiac Enzymes: No results for input(s): CKTOTAL, CKMB, CKMBINDEX, TROPONINI in the last 168 hours. BNP: Invalid input(s): POCBNP CBG:  Recent Labs Lab 01/28/17 1617 01/28/17 2353 01/29/17 0359 01/29/17 0749 01/29/17 1106  GLUCAP 222* 230* 160* 146* 145*   D-Dimer No results for input(s): DDIMER in the last 72 hours. Hgb A1c No results for input(s): HGBA1C in the last 72 hours. Lipid Profile No results for input(s): CHOL, HDL, LDLCALC, TRIG, CHOLHDL, LDLDIRECT in the last 72 hours. Thyroid function studies No results for input(s): TSH, T4TOTAL, T3FREE, THYROIDAB in the last 72 hours.  Invalid input(s): FREET3 Anemia work up No results for input(s): VITAMINB12, FOLATE, FERRITIN, TIBC, IRON, RETICCTPCT in the last 72 hours. Urinalysis    Component Value Date/Time   COLORURINE YELLOW  11/15/2016 2248   APPEARANCEUR CLEAR 11/15/2016 2248   APPEARANCEUR Clear 01/10/2016 1500   LABSPEC 1.010 11/15/2016 2248   PHURINE 7.0 11/15/2016 2248   GLUCOSEU NEGATIVE 11/15/2016 2248   HGBUR NEGATIVE 11/15/2016 2248   BILIRUBINUR NEGATIVE 11/15/2016 2248   BILIRUBINUR Negative 01/10/2016 1500   KETONESUR NEGATIVE 11/15/2016 2248   PROTEINUR NEGATIVE 11/15/2016 2248   UROBILINOGEN 0.2 05/16/2015 2351   NITRITE NEGATIVE 11/15/2016 2248   LEUKOCYTESUR NEGATIVE 11/15/2016 2248   LEUKOCYTESUR Negative 01/10/2016 1500   Sepsis Labs Invalid input(s): PROCALCITONIN,  WBC,  LACTICIDVEN Microbiology No results found for this or any previous visit (from the past 240 hour(s)).   Time coordinating discharge: 35 minutes  SIGNED:   Katrinka Blazing, MD  [facility] 01/29/2017, 2:37 PM Pager [phone] If 7PM-7AM, please contact night-coverage [email or web address] Password [email or web address]

## 2017-01-29 NOTE — Care Management Note (Signed)
Case Management Note  Patient Details  Name: Heidi [name] MRN: [id number] Date of Birth: 08/04/1991  Subjective/Objective:                  Pt admitted with rectal bleed. Chart reviewed for CM needs. Pt is from home, lives with SO. She is ind with ADL's, has PCP, transportation and insurance with drug coverage.   Action/Plan: Plan for pt to return home with self care. No CM needs.   Expected Discharge Date:      01/29/2017           Expected Discharge Plan:  Home/Self Care  In-House Referral:  NA  Discharge planning Services  CM Consult  Post Acute Care Choice:  NA [facility]:  [facility]     Status of Service:  Completed, signed off  Malcolm MetroChildress, Zyan Demske, RN 01/29/2017, 12:50 PM

## 2017-01-29 NOTE — Progress Notes (Signed)
Pt removed IV by self, site clean and dry.  Reviewed discharge instructions and answered questions at this time.

## 2017-01-30 ENCOUNTER — Telehealth: Payer: Self-pay | Admitting: *Deleted

## 2017-01-30 ENCOUNTER — Ambulatory Visit: Payer: Medicaid Other | Admitting: Family Medicine

## 2017-01-30 LAB — HIV ANTIBODY (ROUTINE TESTING W REFLEX): HIV Screen 4th Generation wRfx: NONREACTIVE

## 2017-01-30 NOTE — Telephone Encounter (Signed)
appt scheduled for hospital follow up 

## 2017-01-30 NOTE — Telephone Encounter (Signed)
-----   Message from Gwenith DailyKristen N Hudy, RN sent at 01/30/2017  8:37 AM EDT ----- Regarding: Hospital Follow Up See recent discharge note

## 2017-01-31 ENCOUNTER — Ambulatory Visit (INDEPENDENT_AMBULATORY_CARE_PROVIDER_SITE_OTHER): Payer: Medicaid Other | Admitting: Family Medicine

## 2017-01-31 ENCOUNTER — Encounter: Payer: Self-pay | Admitting: Family Medicine

## 2017-01-31 VITALS — BP 134/84 | HR 83 | Temp 97.9°F | Ht 68.0 in | Wt 323.0 lb

## 2017-01-31 DIAGNOSIS — K649 Unspecified hemorrhoids: Secondary | ICD-10-CM

## 2017-01-31 DIAGNOSIS — K5909 Other constipation: Secondary | ICD-10-CM

## 2017-01-31 NOTE — Progress Notes (Signed)
BP 134/84   Pulse 83   Temp 97.9 F (36.6 C) (Oral)   Ht 5\' 8"  (1.727 m)   Wt (!) 323 lb (146.5 kg)   LMP 01/21/2017   BMI 49.11 kg/m    Subjective:    Patient ID: Heidi [name], female    DOB: 11/27/1990, 26 y.o.   MRN: [id number]  HPI: Heidi [name] is a 26 y.o. female presenting on 01/31/2017 for ER followup (went to [facility] ER 3 days ago for rectal bleeding, patient says she was not told what was wrong, H & P from ER shows hemorrhoids & GI referral placed)   HPI Blood in stool Patient is coming in for an ER follow-up, she went to the emergency department 3 days ago for rectal bleeding, she said she hadn't filled the can a couple of times with bright red blood. She does admit that she been straining a lot and she does have chronic constipation. She denies any blood in the stool but it was more in the bowl and that she still having a couple spots when she wipes her on her underwear today. She denies any more bright red blood in the bowl today like she was having on the day with the emergency department. She denies any fevers or chills or lightheadedness or dizziness or abdominal pain. She has been trying to fight chronic constipation over-the-counter medications but does not be sufficing.  Relevant past medical, surgical, family and social history reviewed and updated as indicated. Interim medical history since our last visit reviewed. Allergies and medications reviewed and updated.  Review of Systems  Constitutional: Negative for chills and fever.  Respiratory: Negative for chest tightness and shortness of breath.   Cardiovascular: Negative for chest pain and leg swelling.  Gastrointestinal: Positive for anal bleeding, blood in stool and constipation. Negative for abdominal pain, diarrhea, nausea and vomiting.  Genitourinary: Negative for difficulty urinating and dysuria.  Musculoskeletal: Negative for back pain and gait problem.  Skin: Negative for rash.  Neurological: Negative for  light-headedness and headaches.  Psychiatric/Behavioral: Negative for agitation and behavioral problems.  All other systems reviewed and are negative.   Per HPI unless specifically indicated above        Objective:    BP 134/84   Pulse 83   Temp 97.9 F (36.6 C) (Oral)   Ht 5\' 8"  (1.727 m)   Wt (!) 323 lb (146.5 kg)   LMP 01/21/2017   BMI 49.11 kg/m   Wt Readings from Last 3 Encounters:  01/31/17 (!) 323 lb (146.5 kg)  01/29/17 (!) 314 lb 15.5 oz (142.9 kg)  12/23/16 (!) 314 lb (142.4 kg)    Physical Exam  Constitutional: She is oriented to person, place, and time. She appears well-developed and well-nourished. No distress.  Eyes: Conjunctivae are normal.  Cardiovascular: Normal rate, regular rhythm, normal heart sounds and intact distal pulses.   No murmur heard. Pulmonary/Chest: Effort normal and breath sounds normal. No respiratory distress. She has no wheezes. She has no rales.  Abdominal: Soft. Bowel sounds are normal. She exhibits no distension. There is no tenderness. There is no rebound and no guarding.  Musculoskeletal: Normal range of motion.  Neurological: She is alert and oriented to person, place, and time. Coordination normal.  Skin: Skin is warm and dry. No rash noted. She is not diaphoretic.  Psychiatric: She has a normal mood and affect. Her behavior is normal.  Nursing note and vitals reviewed.       Assessment &  Plan:   Problem List Items Addressed This Visit    None    Visit Diagnoses    Hemorrhoids, unspecified hemorrhoid type    -  Primary   Gave samples for linzess and recommended using a hemorrhoid.   Chronic constipation       Relevant Orders   Ambulatory referral to Gastroenterology       Follow up plan: Return if symptoms worsen or fail to improve.  Counseling provided for all of the vaccine components Orders Placed This Encounter  Procedures  . Ambulatory referral to Gastroenterology    Arville CareJoshua Almarie Kurdziel, MD [facility] 01/31/2017, 9:50 AM

## 2017-02-03 ENCOUNTER — Encounter: Payer: Self-pay | Admitting: Family Medicine

## 2017-02-04 ENCOUNTER — Encounter: Payer: Self-pay | Admitting: Internal Medicine

## 2017-02-05 MED ORDER — TRAZODONE HCL 50 MG PO TABS: 50.0000 mg | ORAL_TABLET | Freq: Every evening | ORAL | 2 refills | Status: DC | PRN

## 2017-03-13 ENCOUNTER — Other Ambulatory Visit: Payer: Self-pay

## 2017-03-13 ENCOUNTER — Encounter: Payer: Self-pay | Admitting: Gastroenterology

## 2017-03-13 ENCOUNTER — Ambulatory Visit (INDEPENDENT_AMBULATORY_CARE_PROVIDER_SITE_OTHER): Payer: Medicaid Other | Admitting: Gastroenterology

## 2017-03-13 VITALS — BP 158/91 | HR 76 | Temp 97.5°F | Ht 68.0 in | Wt 333.6 lb

## 2017-03-13 DIAGNOSIS — K219 Gastro-esophageal reflux disease without esophagitis: Secondary | ICD-10-CM

## 2017-03-13 DIAGNOSIS — R7989 Other specified abnormal findings of blood chemistry: Secondary | ICD-10-CM

## 2017-03-13 DIAGNOSIS — K625 Hemorrhage of anus and rectum: Secondary | ICD-10-CM

## 2017-03-13 DIAGNOSIS — R1013 Epigastric pain: Secondary | ICD-10-CM

## 2017-03-13 DIAGNOSIS — R945 Abnormal results of liver function studies: Secondary | ICD-10-CM

## 2017-03-13 MED ORDER — NA SULFATE-K SULFATE-MG SULF 17.5-3.13-1.6 GM/177ML PO SOLN: 1.0000 | ORAL | 0 refills | Status: DC

## 2017-03-13 NOTE — Progress Notes (Signed)
Primary Care Physician:  Dettinger, Elige Radon, MD  Primary Gastroenterologist:  Roetta Sessions, MD   Chief Complaint  Patient presents with  . Constipation    HPI:  Heidi [name] is a 26 y.o. female here with hx of anxiety and depression, schizo-affective disorder, type II DM, HTN, GERD Who presented today at the request of PCP for further evaluation of bright red blood per rectum and constipation.  Patient was evaluated in the ED on 01/28/2017 when she presented with rectal bleeding. She reported multiple episodes of large-volume dark red blood that went on for nearly 24 hours. We passed blood without stool. DRE in the ER revealed gross red blood on the rectal exam. External hemorrhoids but no obvious bleeding. Mild tenderness with palpation but no masses or obvious rectal fissures. She was kept for observation with no further bleeding noted. Her hemoglobin on presentation was 12.9, at discharge was 11.5. Also noted that her total bilirubin was 0.2 alkaline phosphatase 128 slightly elevated, ALT 56 slightly elevated, AST 35.  Patient has never had a colonoscopy. No prior upper endoscopy.  She reports that she has to strain with most of her BMs even when her stool is soft. Her stools are generally [place] 5. She has to rock back and forth to have a BM. This is been going on for years. Occasionally will skip a couple of days without a stool. Rarely will have 5-6 stools a day requiring antidiarrheal. Generally just couple stools per day. Both her mother and her maternal aunt have IBS.  Continues to have intermittent bleeding. She states she has hemorrhoids which are bothersome at times. Reflux symptoms including heartburn, regurgitation when she lays down. If she misses Nexium she "pays for it". She been on Nexium for several months. She denies dysphagia. She complains of epigastric pain with and without food. Nothing seems to alleviate the epigastric pain. Moderate in quality.    Current Outpatient  Prescriptions  Medication Sig Dispense Refill  . albuterol (PROVENTIL HFA;VENTOLIN HFA) 108 (90 Base) MCG/ACT inhaler Inhale 2 puffs into the lungs every 6 (six) hours as needed for wheezing or shortness of breath. 1 Inhaler 0  . ALPRAZolam (XANAX) 0.25 MG tablet Take 1 tablet (0.25 mg total) by mouth 2 (two) times daily as needed for anxiety. 10 tablet 2  . amLODipine (NORVASC) 5 MG tablet Take 1 tablet (5 mg total) by mouth daily. 90 tablet 1  . cholecalciferol (VITAMIN D) 1000 units tablet Take 1,000 Units by mouth daily.    . cyclobenzaprine (FLEXERIL) 10 MG tablet Take 1 tablet (10 mg total) by mouth 3 (three) times daily as needed for muscle spasms. 30 tablet 2  . esomeprazole (NEXIUM) 40 MG capsule Take 1 capsule (40 mg total) by mouth daily. 30 capsule 1  . glucose blood (ACCU-CHEK GUIDE) test strip Use to check BG once daily at varying times of day. 100 each 3  . Lancets 30G MISC Dispense Accu-Chek guide lancets.  Check blood sugar at varying times once per day. 100 each 11  . lisinopril (PRINIVIL,ZESTRIL) 20 MG tablet TAKE ONE TABLET BY MOUTH ONCE DAILY 30 tablet 5  . meloxicam (MOBIC) 7.5 MG tablet Take 2 tablets (15 mg total) by mouth daily. (Patient taking differently: Take 15 mg by mouth at bedtime. ) 60 tablet 2  . metFORMIN (GLUCOPHAGE) 500 MG tablet Take 1 tablet (500 mg total) by mouth 2 (two) times daily with a meal. (Patient taking differently: Take 500 mg by mouth daily with breakfast. )  180 tablet 1  . OLANZapine (ZYPREXA) 10 MG tablet Take 1 tablet (10 mg total) by mouth at bedtime. 90 tablet 1  . sertraline (ZOLOFT) 100 MG tablet Take 2 tablets (200 mg total) by mouth at bedtime. 180 tablet 2  . traZODone (DESYREL) 50 MG tablet Take 1 tablet (50 mg total) by mouth at bedtime as needed for sleep. 30 tablet 2   No current facility-administered medications for this visit.     Allergies as of 03/13/2017 - Review Complete 03/13/2017  Allergen Reaction Noted  . Hydroxyzine   10/23/2016  . Linzess [linaclotide] Hives 03/13/2017  . Lorazepam  11/29/2015  . Morphine and related Other (See Comments) 08/31/2014  . Quetiapine  11/29/2015    Past Medical History:  Diagnosis Date  . Abdominal pain 01/10/2016  . Anxiety   . Back pain   . Bloating 01/10/2016  . Depression   . GERD (gastroesophageal reflux disease)   . Hematuria 01/10/2016  . Herpes simplex virus (HSV) infection   . Hypertension   . Neuropathy    idopathic  . Osteoarthritis   . Prediabetes   . Schizo-affective schizophrenia (HCC)   . Type 2 diabetes mellitus (HCC) 03/28/2016  . Urinary frequency 01/10/2016    Past Surgical History:  Procedure Laterality Date  . CESAREAN SECTION    . DILATION AND CURETTAGE OF UTERUS    . ESSURE TUBAL LIGATION      Family History  Problem Relation Age of Onset  . Depression Mother   . Hypertension Mother   . Diabetes Mother   . Mental illness Father   . Hypertension Father   . Diabetes Father   . Cancer Maternal Aunt   . Diabetes Paternal Grandmother   . Hypertension Paternal Grandmother   . Arthritis Paternal Grandmother   . Stroke Paternal Grandfather   . Heart disease Paternal Grandfather   . Hypertension Paternal Grandfather   . Diabetes Paternal Grandfather   . Schizophrenia Maternal Grandfather   . Alcohol abuse Maternal Grandfather   . Other Sister        recovering addict  . Schizophrenia Sister   . Irritable bowel syndrome Sister   . Other Sister        recovering addict  . Bipolar disorder Sister   . Seizures Sister   . Colon cancer Neg Hx   . Inflammatory bowel disease Neg Hx   . Celiac disease Neg Hx     Social History   Social History  . Marital status: Legally Separated    Spouse name: N/A  . Number of children: 1  . Years of education: HS   Occupational History  . Unemployed    Social History Main Topics  . Smoking status: Current Every Day Smoker    Packs/day: 1.00    Years: 10.00    Types: Cigarettes  .  Smokeless tobacco: Never Used  . Alcohol use 0.0 oz/week     Comment: Occasionally   . Drug use: Yes    Types: Marijuana     Comment: Occasionally  . Sexual activity: Yes    Birth control/ protection: Surgical     Comment: tubal   Other Topics Concern  . Not on file   Social History Narrative   Lives at home with her boyfriend.   Right-handed.   Drinks approximately two 2-liter sodas per day.   Drinks 1 cup coffee per day.         ROS:  General: Negative for anorexia, weight  loss, fever, chills, fatigue, weakness. Eyes: Negative for vision changes.  ENT: Negative for hoarseness, difficulty swallowing , nasal congestion. CV: Negative for chest pain, angina, palpitations, dyspnea on exertion, peripheral edema.  Respiratory: Negative for dyspnea at rest, dyspnea on exertion, cough, sputum, wheezing.  GI: See history of present illness. GU:  Negative for dysuria, hematuria, urinary incontinence, urinary frequency, nocturnal urination.  ZO:[id number] of chronic joint pain. Derm: Negative for rash or itching.  Neuro: Negative for weakness, abnormal sensation, seizure, frequent headaches, memory loss, confusion.  Psych: History of anxiety, depression. Denies suicidal ideation, hallucinations.  Endo: Negative for unusual weight change.  Heme: Negative for bruising or bleeding. Allergy: Negative for rash or hives.    Physical Examination:  BP (!) 158/91   Pulse 76   Temp (!) 97.5 F (36.4 C) (Oral)   Ht 5\' 8"  (1.727 m)   Wt (!) 333 lb 9.6 oz (151.3 kg)   BMI 50.72 kg/m    General: Very pleasant morbidly obese Caucasian female in no acute distress  Head: Normocephalic, atraumatic.   Eyes: Conjunctiva pink, no icterus. Mouth: Oropharyngeal mucosa moist and pink , no lesions erythema or exudate. Neck: Supple without thyromegaly, masses, or lymphadenopathy.  Lungs: Clear to auscultation bilaterally.  Heart: Regular rate and rhythm, no murmurs rubs or gallops.  Abdomen:  Bowel sounds are normal,  mild to moderate epigastric tenderness nondistended, no hepatosplenomegaly or masses, no abdominal bruits or    hernia , no rebound or guarding.  Exam limited by body habitus. Mild rectus diastases Rectal: deferred Extremities: No lower extremity edema. No clubbing or deformities.  Neuro: Alert and oriented x 4 , grossly normal neurologically.  Skin: Warm and dry, no rash or jaundice.   Psych: Alert and cooperative, normal mood and affect.  Labs: Lab Results  Component Value Date   WBC 10.9 (H) 01/29/2017   HGB 11.5 (L) 01/29/2017   HCT 34.0 (L) 01/29/2017   MCV 90.7 01/29/2017   PLT 234 01/29/2017   Lab Results  Component Value Date   CREATININE 0.75 01/28/2017   BUN 9 01/28/2017   NA 137 01/28/2017   K 3.9 01/28/2017   CL 103 01/28/2017   CO2 26 01/28/2017   Lab Results  Component Value Date   ALT 56 (H) 01/28/2017   AST 35 01/28/2017   ALKPHOS 128 (H) 01/28/2017   BILITOT 0.2 (L) 01/28/2017     Imaging Studies: No results found.

## 2017-03-13 NOTE — Assessment & Plan Note (Signed)
Recent 24 hour observation for large volume hematochezia. Hemoglobin at discharge 11.5. Will recheck today. She describes hemorrhoids. It is unclear whether her recent bleeding were related to hemorrhoids or other etiology. She has a tendency to have soft stool but still complains of straining to have a BM. Rarely skips a day without a BM. Initially plan for colonoscopy in the near future. Plan for deep sedation in the OR given polypharmacy  I have discussed the risks, alternatives, benefits with regards to but not limited to the risk of reaction to medication, bleeding, infection, perforation and the patient is agreeable to proceed. Written consent to be obtained.

## 2017-03-13 NOTE — Assessment & Plan Note (Signed)
Mildly elevated ALT and alkaline phosphatase. We will recheck LFTs, screen for hepatitis B and C and for hepatitis B immunity as she describes partially completing hepatitis B vaccinations. Suspect fatty liver. Based on labs she will likely get abdominal ultrasound for further evaluation. Further recommendations to follow labs pending

## 2017-03-13 NOTE — Patient Instructions (Signed)
1. Plan for colonoscopy and upper endoscopy as scheduled. Please see separate instructions. 2. We will order labs to be done during her preop appointment to make this more convenient for you.

## 2017-03-13 NOTE — Assessment & Plan Note (Signed)
History of GERD/regurgitation, epigastric pain. Heartburn controlled on Nexium but develops refractory symptoms if misses a single dose. Continues to have significant regurgitation predominantly if she lays down, epigastric pain which may or may not be related to food. Offered an upper endoscopy at time of colonoscopy.  I have discussed the risks, alternatives, benefits with regards to but not limited to the risk of reaction to medication, bleeding, infection, perforation and the patient is agreeable to proceed. Written consent to be obtained.

## 2017-03-14 ENCOUNTER — Encounter (HOSPITAL_COMMUNITY): Payer: Self-pay

## 2017-03-14 ENCOUNTER — Encounter (HOSPITAL_COMMUNITY)
Admission: RE | Admit: 2017-03-14 | Discharge: 2017-03-14 | Disposition: A | Discharge status: Home / Self Care | Payer: Medicaid Other | Source: Ambulatory Visit | Attending: Internal Medicine | Admitting: Internal Medicine

## 2017-03-14 DIAGNOSIS — R1013 Epigastric pain: Secondary | ICD-10-CM | POA: Insufficient documentation

## 2017-03-14 DIAGNOSIS — K219 Gastro-esophageal reflux disease without esophagitis: Secondary | ICD-10-CM | POA: Diagnosis not present

## 2017-03-14 DIAGNOSIS — Z01812 Encounter for preprocedural laboratory examination: Secondary | ICD-10-CM | POA: Diagnosis not present

## 2017-03-14 DIAGNOSIS — R7989 Other specified abnormal findings of blood chemistry: Secondary | ICD-10-CM | POA: Diagnosis not present

## 2017-03-14 LAB — BASIC METABOLIC PANEL
Anion gap: 13 (ref 5–15)
BUN: 8 mg/dL (ref 6–20)
CHLORIDE: 99 mmol/L — AB (ref 101–111)
CO2: 21 mmol/L — ABNORMAL LOW (ref 22–32)
CREATININE: 0.75 mg/dL (ref 0.44–1.00)
Calcium: 9.3 mg/dL (ref 8.9–10.3)
Glucose, Bld: 273 mg/dL — ABNORMAL HIGH (ref 65–99)
POTASSIUM: 4 mmol/L (ref 3.5–5.1)
SODIUM: 133 mmol/L — AB (ref 135–145)

## 2017-03-14 LAB — CBC WITH DIFFERENTIAL/PLATELET
BASOS ABS: 0 10*3/uL (ref 0.0–0.1)
Basophils Relative: 0 %
EOS ABS: 0.2 10*3/uL (ref 0.0–0.7)
Eosinophils Relative: 2 %
HCT: 35.9 % — ABNORMAL LOW (ref 36.0–46.0)
HEMOGLOBIN: 12.1 g/dL (ref 12.0–15.0)
LYMPHS ABS: 1.9 10*3/uL (ref 0.7–4.0)
LYMPHS PCT: 18 %
MCH: 29.4 pg (ref 26.0–34.0)
MCHC: 33.7 g/dL (ref 30.0–36.0)
MCV: 87.3 fL (ref 78.0–100.0)
Monocytes Absolute: 0.5 10*3/uL (ref 0.1–1.0)
Monocytes Relative: 5 %
NEUTROS PCT: 75 %
Neutro Abs: 7.9 10*3/uL — ABNORMAL HIGH (ref 1.7–7.7)
PLATELETS: 219 10*3/uL (ref 150–400)
RBC: 4.11 MIL/uL (ref 3.87–5.11)
RDW: 13.7 % (ref 11.5–15.5)
WBC: 10.5 10*3/uL (ref 4.0–10.5)

## 2017-03-14 LAB — HEPATIC FUNCTION PANEL
ALK PHOS: 130 U/L — AB (ref 38–126)
ALT: 63 U/L — AB (ref 14–54)
AST: 50 U/L — AB (ref 15–41)
Albumin: 3.8 g/dL (ref 3.5–5.0)
Bilirubin, Direct: <0.1 mg/dL — ABNORMAL LOW (ref 0.1–0.5)
TOTAL PROTEIN: 6.7 g/dL (ref 6.5–8.1)
Total Bilirubin: 0.4 mg/dL (ref 0.3–1.2)

## 2017-03-14 NOTE — Patient Instructions (Signed)
Heidi [name]  03/14/2017     @PREFPERIOPPHARMACY @   Your procedure is scheduled on 03/17/2017.  Report to [facility] at 8:00 A.M.  Call this number if you have problems the morning of surgery:  [phone]   Remember:  Do not eat food or drink liquids after midnight.  Take these medicines the morning of surgery with A SIP OF WATER Albuterol inhaler and bring with you, Xanax, Amlodipine, Flexeril, Lisinopril,  Mobic, Zyprexa, Zoloft   Do not wear jewelry, make-up or nail polish.  Do not wear lotions, powders, or perfumes, or deoderant.  Do not shave 48 hours prior to surgery.  Men may shave face and neck.  Do not bring valuables to the hospital.  [facility] is not responsible for any belongings or valuables.  Contacts, dentures or bridgework may not be worn into surgery.  Leave your suitcase in the car.  After surgery it may be brought to your room.  For patients admitted to the hospital, discharge time will be determined by your treatment team.  Patients discharged the day of surgery will not be allowed to drive home.    Please read over the following fact sheets that you were given. Anesthesia Post-op Instructions     PATIENT INSTRUCTIONS POST-ANESTHESIA  IMMEDIATELY FOLLOWING SURGERY:  Do not drive or operate machinery for the first twenty four hours after surgery.  Do not make any important decisions for twenty four hours after surgery or while taking narcotic pain medications or sedatives.  If you develop intractable nausea and vomiting or a severe headache please notify your doctor immediately.  FOLLOW-UP:  Please make an appointment with your surgeon as instructed. You do not need to follow up with anesthesia unless specifically instructed to do so.  WOUND CARE INSTRUCTIONS (if applicable):  Keep a dry clean dressing on the anesthesia/puncture wound site if there is drainage.  Once the wound has quit draining you may leave it open to air.  Generally you should  leave the bandage intact for twenty four hours unless there is drainage.  If the epidural site drains for more than 36-48 hours please call the anesthesia department.  QUESTIONS?:  Please feel free to call your physician or the hospital operator if you have any questions, and they will be happy to assist you.      Esophagogastroduodenoscopy Esophagogastroduodenoscopy (EGD) is a procedure to examine the lining of the esophagus, stomach, and first part of the small intestine (duodenum). This procedure is done to check for problems such as inflammation, bleeding, ulcers, or growths. During this procedure, a long, flexible, lighted tube with a camera attached (endoscope) is inserted down the throat. Tell a health care provider about:  Any allergies you have.  All medicines you are taking, including vitamins, herbs, eye drops, creams, and over-the-counter medicines.  Any problems you or family members have had with anesthetic medicines.  Any blood disorders you have.  Any surgeries you have had.  Any medical conditions you have.  Whether you are pregnant or may be pregnant. What are the risks? Generally, this is a safe procedure. However, problems may occur, including:  Infection.  Bleeding.  A tear (perforation) in the esophagus, stomach, or duodenum.  Trouble breathing.  Excessive sweating.  Spasms of the larynx.  A slowed heartbeat.  Low blood pressure.  What happens before the procedure?  Follow instructions from your health care provider about eating or drinking restrictions.  Ask your health care provider about: ? Changing or stopping  your regular medicines. This is especially important if you are taking diabetes medicines or blood thinners. ? Taking medicines such as aspirin and ibuprofen. These medicines can thin your blood. Do not take these medicines before your procedure if your health care provider instructs you not to.  Plan to have someone take you home  after the procedure.  If you wear dentures, be ready to remove them before the procedure. What happens during the procedure?  To reduce your risk of infection, your health care team will wash or sanitize their hands.  An IV tube will be put in a vein in your hand or arm. You will get medicines and fluids through this tube.  You will be given one or more of the following: ? A medicine to help you relax (sedative). ? A medicine to numb the area (local anesthetic). This medicine may be sprayed into your throat. It will make you feel more comfortable and keep you from gagging or coughing during the procedure. ? A medicine for pain.  A mouth guard may be placed in your mouth to protect your teeth and to keep you from biting on the endoscope.  You will be asked to lie on your left side.  The endoscope will be lowered down your throat into your esophagus, stomach, and duodenum.  Air will be put into the endoscope. This will help your health care provider see better.  The lining of your esophagus, stomach, and duodenum will be examined.  Your health care provider may: ? Take a tissue sample so it can be looked at in a lab (biopsy). ? Remove growths. ? Remove objects (foreign bodies) that are stuck. ? Treat any bleeding with medicines or other devices that stop tissue from bleeding. ? Widen (dilate) or stretch narrowed areas of your esophagus and stomach.  The endoscope will be taken out. The procedure may vary among health care providers and hospitals. What happens after the procedure?  Your blood pressure, heart rate, breathing rate, and blood oxygen level will be monitored often until the medicines you were given have worn off.  Do not eat or drink anything until the numbing medicine has worn off and your gag reflex has returned. This information is not intended to replace advice given to you by your health care provider. Make sure you discuss any questions you have with your health  care provider. Document Released: 12/20/2004 Document Revised: 01/25/2016 Document Reviewed: 07/13/2015 Elsevier Interactive Patient Education  2018 [employer] Colonoscopy, Adult A colonoscopy is an exam to look at the entire large intestine. During the exam, a lubricated, bendable tube is inserted into the anus and then passed into the rectum, colon, and other parts of the large intestine. A colonoscopy is often done as a part of normal colorectal screening or in response to certain symptoms, such as anemia, persistent diarrhea, abdominal pain, and blood in the stool. The exam can help screen for and diagnose medical problems, including:  Tumors.  Polyps.  Inflammation.  Areas of bleeding.  Tell a health care provider about:  Any allergies you have.  All medicines you are taking, including vitamins, herbs, eye drops, creams, and over-the-counter medicines.  Any problems you or family members have had with anesthetic medicines.  Any blood disorders you have.  Any surgeries you have had.  Any medical conditions you have.  Any problems you have had passing stool. What are the risks? Generally, this is a safe procedure. However, problems may occur, including:  Bleeding.  A tear in the intestine.  A reaction to medicines given during the exam.  Infection (rare).  What happens before the procedure? Eating and drinking restrictions Follow instructions from your health care provider about eating and drinking, which may include:  A few days before the procedure - follow a low-fiber diet. Avoid nuts, seeds, dried fruit, raw fruits, and vegetables.  1-3 days before the procedure - follow a clear liquid diet. Drink only clear liquids, such as clear broth or bouillon, black coffee or tea, clear juice, clear soft drinks or sports drinks, gelatin dessert, and popsicles. Avoid any liquids that contain red or purple dye.  On the day of the procedure - do not eat or drink anything  during the 2 hours before the procedure, or within the time period that your health care provider recommends.  Bowel prep If you were prescribed an oral bowel prep to clean out your colon:  Take it as told by your health care provider. Starting the day before your procedure, you will need to drink a large amount of medicated liquid. The liquid will cause you to have multiple loose stools until your stool is almost clear or light green.  If your skin or anus gets irritated from diarrhea, you may use these to relieve the irritation: ? Medicated wipes, such as adult wet wipes with aloe and vitamin E. ? A skin soothing-product like petroleum jelly.  If you vomit while drinking the bowel prep, take a break for up to 60 minutes and then begin the bowel prep again. If vomiting continues and you cannot take the bowel prep without vomiting, call your health care provider.  General instructions  Ask your health care provider about changing or stopping your regular medicines. This is especially important if you are taking diabetes medicines or blood thinners.  Plan to have someone take you home from the hospital or clinic. What happens during the procedure?  An IV tube may be inserted into one of your veins.  You will be given medicine to help you relax (sedative).  To reduce your risk of infection: ? Your health care team will wash or sanitize their hands. ? Your anal area will be washed with soap.  You will be asked to lie on your side with your knees bent.  Your health care provider will lubricate a long, thin, flexible tube. The tube will have a camera and a light on the end.  The tube will be inserted into your anus.  The tube will be gently eased through your rectum and colon.  Air will be delivered into your colon to keep it open. You may feel some pressure or cramping.  The camera will be used to take images during the procedure.  A small tissue sample may be removed from your  body to be examined under a microscope (biopsy). If any potential problems are found, the tissue will be sent to a lab for testing.  If small polyps are found, your health care provider may remove them and have them checked for cancer cells.  The tube that was inserted into your anus will be slowly removed. The procedure may vary among health care providers and hospitals. What happens after the procedure?  Your blood pressure, heart rate, breathing rate, and blood oxygen level will be monitored until the medicines you were given have worn off.  Do not drive for 24 hours after the exam.  You may have a small amount of blood in your stool.  You may pass gas and have mild abdominal cramping or bloating due to the air that was used to inflate your colon during the exam.  It is up to you to get the results of your procedure. Ask your health care provider, or the department performing the procedure, when your results will be ready. This information is not intended to replace advice given to you by your health care provider. Make sure you discuss any questions you have with your health care provider. Document Released: 08/16/2000 Document Revised: 06/19/2016 Document Reviewed: 10/31/2015 Elsevier Interactive Patient Education  2018 [employer].

## 2017-03-15 LAB — HEPATITIS C ANTIBODY: HCV Ab: <0.1 s/co ratio (ref 0.0–0.9)

## 2017-03-15 LAB — HEPATITIS B SURFACE ANTIBODY,QUALITATIVE: HEP B S AB: REACTIVE

## 2017-03-15 LAB — HEPATITIS B SURFACE ANTIGEN: Hepatitis B Surface Ag: NEGATIVE

## 2017-03-17 ENCOUNTER — Ambulatory Visit (HOSPITAL_COMMUNITY): Payer: Medicaid Other | Admitting: Anesthesiology

## 2017-03-17 ENCOUNTER — Ambulatory Visit (HOSPITAL_COMMUNITY)
Admission: RE | Admit: 2017-03-17 | Discharge: 2017-03-17 | Disposition: A | Discharge status: Home / Self Care | Payer: Medicaid Other | Source: Ambulatory Visit | Attending: Internal Medicine | Admitting: Internal Medicine

## 2017-03-17 ENCOUNTER — Encounter (HOSPITAL_COMMUNITY)
Admission: RE | Disposition: A | Discharge status: Home / Self Care | Payer: Self-pay | Source: Ambulatory Visit | Attending: Internal Medicine

## 2017-03-17 ENCOUNTER — Encounter (HOSPITAL_COMMUNITY): Payer: Self-pay

## 2017-03-17 DIAGNOSIS — R1013 Epigastric pain: Secondary | ICD-10-CM | POA: Diagnosis not present

## 2017-03-17 DIAGNOSIS — Z79899 Other long term (current) drug therapy: Secondary | ICD-10-CM | POA: Diagnosis not present

## 2017-03-17 DIAGNOSIS — I10 Essential (primary) hypertension: Secondary | ICD-10-CM | POA: Insufficient documentation

## 2017-03-17 DIAGNOSIS — M1990 Unspecified osteoarthritis, unspecified site: Secondary | ICD-10-CM | POA: Insufficient documentation

## 2017-03-17 DIAGNOSIS — Z888 Allergy status to other drugs, medicaments and biological substances status: Secondary | ICD-10-CM | POA: Diagnosis not present

## 2017-03-17 DIAGNOSIS — K644 Residual hemorrhoidal skin tags: Secondary | ICD-10-CM | POA: Diagnosis not present

## 2017-03-17 DIAGNOSIS — F17210 Nicotine dependence, cigarettes, uncomplicated: Secondary | ICD-10-CM | POA: Diagnosis not present

## 2017-03-17 DIAGNOSIS — K921 Melena: Secondary | ICD-10-CM | POA: Diagnosis present

## 2017-03-17 DIAGNOSIS — F419 Anxiety disorder, unspecified: Secondary | ICD-10-CM | POA: Diagnosis not present

## 2017-03-17 DIAGNOSIS — K210 Gastro-esophageal reflux disease with esophagitis: Secondary | ICD-10-CM | POA: Insufficient documentation

## 2017-03-17 DIAGNOSIS — R7989 Other specified abnormal findings of blood chemistry: Secondary | ICD-10-CM

## 2017-03-17 DIAGNOSIS — Z885 Allergy status to narcotic agent status: Secondary | ICD-10-CM | POA: Diagnosis not present

## 2017-03-17 DIAGNOSIS — K625 Hemorrhage of anus and rectum: Secondary | ICD-10-CM

## 2017-03-17 DIAGNOSIS — M62838 Other muscle spasm: Secondary | ICD-10-CM | POA: Diagnosis not present

## 2017-03-17 DIAGNOSIS — K209 Esophagitis, unspecified: Secondary | ICD-10-CM

## 2017-03-17 DIAGNOSIS — E119 Type 2 diabetes mellitus without complications: Secondary | ICD-10-CM | POA: Diagnosis not present

## 2017-03-17 DIAGNOSIS — F259 Schizoaffective disorder, unspecified: Secondary | ICD-10-CM | POA: Diagnosis not present

## 2017-03-17 DIAGNOSIS — K643 Fourth degree hemorrhoids: Secondary | ICD-10-CM | POA: Insufficient documentation

## 2017-03-17 DIAGNOSIS — Z8249 Family history of ischemic heart disease and other diseases of the circulatory system: Secondary | ICD-10-CM | POA: Diagnosis not present

## 2017-03-17 DIAGNOSIS — Z7984 Long term (current) use of oral hypoglycemic drugs: Secondary | ICD-10-CM | POA: Insufficient documentation

## 2017-03-17 DIAGNOSIS — K219 Gastro-esophageal reflux disease without esophagitis: Secondary | ICD-10-CM

## 2017-03-17 DIAGNOSIS — R945 Abnormal results of liver function studies: Secondary | ICD-10-CM

## 2017-03-17 HISTORY — PX: ESOPHAGOGASTRODUODENOSCOPY (EGD) WITH PROPOFOL: SHX5813

## 2017-03-17 HISTORY — PX: COLONOSCOPY WITH PROPOFOL: SHX5780

## 2017-03-17 LAB — GLUCOSE, CAPILLARY
GLUCOSE-CAPILLARY: 191 mg/dL — AB (ref 65–99)
Glucose-Capillary: 190 mg/dL — ABNORMAL HIGH (ref 65–99)

## 2017-03-17 LAB — PREGNANCY, URINE: PREG TEST UR: NEGATIVE

## 2017-03-17 SURGERY — COLONOSCOPY WITH PROPOFOL
Anesthesia: Monitor Anesthesia Care

## 2017-03-17 MED ORDER — PROPOFOL 10 MG/ML IV BOLUS
INTRAVENOUS | Status: AC
Start: 2017-03-17 — End: 2017-03-17
  Filled 2017-03-17: qty 60

## 2017-03-17 MED ORDER — LIDOCAINE HCL (PF) 1 % IJ SOLN
INTRAMUSCULAR | Status: AC
  Filled 2017-03-17: qty 5

## 2017-03-17 MED ORDER — MIDAZOLAM HCL 2 MG/2ML IJ SOLN
INTRAMUSCULAR | Status: AC
  Filled 2017-03-17: qty 2

## 2017-03-17 MED ORDER — LIDOCAINE VISCOUS 2 % MT SOLN: 15.0000 mL | Freq: Once | OROMUCOSAL | Status: DC

## 2017-03-17 MED ORDER — GLYCOPYRROLATE 0.2 MG/ML IJ SOLN: 0.2000 mg | Freq: Once | INTRAMUSCULAR | Status: DC | PRN

## 2017-03-17 MED ORDER — PROPOFOL 10 MG/ML IV BOLUS
INTRAVENOUS | Status: AC
Start: 2017-03-17 — End: 2017-03-17
  Filled 2017-03-17: qty 40

## 2017-03-17 MED ORDER — LIDOCAINE VISCOUS 2 % MT SOLN
OROMUCOSAL | Status: AC
  Filled 2017-03-17: qty 15

## 2017-03-17 MED ORDER — FENTANYL CITRATE (PF) 100 MCG/2ML IJ SOLN
INTRAMUSCULAR | Status: AC
  Filled 2017-03-17: qty 2

## 2017-03-17 MED ORDER — CHLORHEXIDINE GLUCONATE CLOTH 2 % EX PADS: 6.0000 | MEDICATED_PAD | Freq: Once | CUTANEOUS | Status: DC

## 2017-03-17 MED ORDER — MIDAZOLAM HCL 2 MG/2ML IJ SOLN
1.0000 mg | INTRAMUSCULAR | Status: AC
  Administered 2017-03-17: 2 mg via INTRAVENOUS

## 2017-03-17 MED ORDER — GLYCOPYRROLATE 0.2 MG/ML IJ SOLN
INTRAMUSCULAR | Status: AC
  Filled 2017-03-17: qty 1

## 2017-03-17 MED ORDER — PROPOFOL 500 MG/50ML IV EMUL
INTRAVENOUS | Status: DC | PRN
  Administered 2017-03-17: 09:00:00 via INTRAVENOUS
  Administered 2017-03-17: 150 ug/kg/min via INTRAVENOUS
  Administered 2017-03-17: 09:00:00 via INTRAVENOUS

## 2017-03-17 MED ORDER — FENTANYL CITRATE (PF) 100 MCG/2ML IJ SOLN
25.0000 ug | Freq: Once | INTRAMUSCULAR | Status: AC
  Administered 2017-03-17: 25 ug via INTRAVENOUS

## 2017-03-17 MED ORDER — LACTATED RINGERS IV SOLN
INTRAVENOUS | Status: DC
  Administered 2017-03-17: 09:00:00 via INTRAVENOUS

## 2017-03-17 MED ORDER — LIDOCAINE HCL (CARDIAC) 10 MG/ML IV SOLN
INTRAVENOUS | Status: DC | PRN
  Administered 2017-03-17: 30 mg via INTRAVENOUS

## 2017-03-17 NOTE — Anesthesia Postprocedure Evaluation (Signed)
Anesthesia Post Note  Patient: Heidi [name]  Procedure(s) Performed: Procedure(s) (LRB): COLONOSCOPY WITH PROPOFOL (N/A) ESOPHAGOGASTRODUODENOSCOPY (EGD) WITH PROPOFOL (N/A)  Patient location during evaluation: PACU Anesthesia Type: MAC Level of consciousness: awake and alert and oriented Pain management: pain level controlled Vital Signs Assessment: post-procedure vital signs reviewed and stable Respiratory status: respiratory function stable and spontaneous breathing Cardiovascular status: stable Postop Assessment: no signs of nausea or vomiting Anesthetic complications: no     Last Vitals:  Vitals:   03/17/17 0835 03/17/17 0840  BP: (!) 101/58   Resp: 20 16  Temp:      Last Pain:  Vitals:   03/17/17 0819  TempSrc: Oral                 ADAMS, AMY A

## 2017-03-17 NOTE — Progress Notes (Signed)
CC'D TO PCP °

## 2017-03-17 NOTE — H&P (View-Only) (Signed)
Primary Care Physician:  Dettinger, Joshua A, MD  Primary Gastroenterologist:  Michael Rourk, MD   Chief Complaint  Patient presents with  . Constipation    HPI:  Heidi [name] is a 25 y.o. female here with hx of anxiety and depression, schizo-affective disorder, type II DM, HTN, GERD Who presented today at the request of PCP for further evaluation of bright red blood per rectum and constipation.  Patient was evaluated in the ED on 01/28/2017 when she presented with rectal bleeding. She reported multiple episodes of large-volume dark red blood that went on for nearly 24 hours. We passed blood without stool. DRE in the ER revealed gross red blood on the rectal exam. External hemorrhoids but no obvious bleeding. Mild tenderness with palpation but no masses or obvious rectal fissures. She was kept for observation with no further bleeding noted. Her hemoglobin on presentation was 12.9, at discharge was 11.5. Also noted that her total bilirubin was 0.2 alkaline phosphatase 128 slightly elevated, ALT 56 slightly elevated, AST 35.  Patient has never had a colonoscopy. No prior upper endoscopy.  She reports that she has to strain with most of her BMs even when her stool is soft. Her stools are generally [place] 5. She has to rock back and forth to have a BM. This is been going on for years. Occasionally will skip a couple of days without a stool. Rarely will have 5-6 stools a day requiring antidiarrheal. Generally just couple stools per day. Both her mother and her maternal aunt have IBS.  Continues to have intermittent bleeding. She states she has hemorrhoids which are bothersome at times. Reflux symptoms including heartburn, regurgitation when she lays down. If she misses Nexium she "pays for it". She been on Nexium for several months. She denies dysphagia. She complains of epigastric pain with and without food. Nothing seems to alleviate the epigastric pain. Moderate in quality.    Current Outpatient  Prescriptions  Medication Sig Dispense Refill  . albuterol (PROVENTIL HFA;VENTOLIN HFA) 108 (90 Base) MCG/ACT inhaler Inhale 2 puffs into the lungs every 6 (six) hours as needed for wheezing or shortness of breath. 1 Inhaler 0  . ALPRAZolam (XANAX) 0.25 MG tablet Take 1 tablet (0.25 mg total) by mouth 2 (two) times daily as needed for anxiety. 10 tablet 2  . amLODipine (NORVASC) 5 MG tablet Take 1 tablet (5 mg total) by mouth daily. 90 tablet 1  . cholecalciferol (VITAMIN D) 1000 units tablet Take 1,000 Units by mouth daily.    . cyclobenzaprine (FLEXERIL) 10 MG tablet Take 1 tablet (10 mg total) by mouth 3 (three) times daily as needed for muscle spasms. 30 tablet 2  . esomeprazole (NEXIUM) 40 MG capsule Take 1 capsule (40 mg total) by mouth daily. 30 capsule 1  . glucose blood (ACCU-CHEK GUIDE) test strip Use to check BG once daily at varying times of day. 100 each 3  . Lancets 30G MISC Dispense Accu-Chek guide lancets.  Check blood sugar at varying times once per day. 100 each 11  . lisinopril (PRINIVIL,ZESTRIL) 20 MG tablet TAKE ONE TABLET BY MOUTH ONCE DAILY 30 tablet 5  . meloxicam (MOBIC) 7.5 MG tablet Take 2 tablets (15 mg total) by mouth daily. (Patient taking differently: Take 15 mg by mouth at bedtime. ) 60 tablet 2  . metFORMIN (GLUCOPHAGE) 500 MG tablet Take 1 tablet (500 mg total) by mouth 2 (two) times daily with a meal. (Patient taking differently: Take 500 mg by mouth daily with breakfast. )   180 tablet 1  . OLANZapine (ZYPREXA) 10 MG tablet Take 1 tablet (10 mg total) by mouth at bedtime. 90 tablet 1  . sertraline (ZOLOFT) 100 MG tablet Take 2 tablets (200 mg total) by mouth at bedtime. 180 tablet 2  . traZODone (DESYREL) 50 MG tablet Take 1 tablet (50 mg total) by mouth at bedtime as needed for sleep. 30 tablet 2   No current facility-administered medications for this visit.     Allergies as of 03/13/2017 - Review Complete 03/13/2017  Allergen Reaction Noted  . Hydroxyzine   10/23/2016  . Linzess [linaclotide] Hives 03/13/2017  . Lorazepam  11/29/2015  . Morphine and related Other (See Comments) 08/31/2014  . Quetiapine  11/29/2015    Past Medical History:  Diagnosis Date  . Abdominal pain 01/10/2016  . Anxiety   . Back pain   . Bloating 01/10/2016  . Depression   . GERD (gastroesophageal reflux disease)   . Hematuria 01/10/2016  . Herpes simplex virus (HSV) infection   . Hypertension   . Neuropathy    idopathic  . Osteoarthritis   . Prediabetes   . Schizo-affective schizophrenia (HCC)   . Type 2 diabetes mellitus (HCC) 03/28/2016  . Urinary frequency 01/10/2016    Past Surgical History:  Procedure Laterality Date  . CESAREAN SECTION    . DILATION AND CURETTAGE OF UTERUS    . ESSURE TUBAL LIGATION      Family History  Problem Relation Age of Onset  . Depression Mother   . Hypertension Mother   . Diabetes Mother   . Mental illness Father   . Hypertension Father   . Diabetes Father   . Cancer Maternal Aunt   . Diabetes Paternal Grandmother   . Hypertension Paternal Grandmother   . Arthritis Paternal Grandmother   . Stroke Paternal Grandfather   . Heart disease Paternal Grandfather   . Hypertension Paternal Grandfather   . Diabetes Paternal Grandfather   . Schizophrenia Maternal Grandfather   . Alcohol abuse Maternal Grandfather   . Other Sister        recovering addict  . Schizophrenia Sister   . Irritable bowel syndrome Sister   . Other Sister        recovering addict  . Bipolar disorder Sister   . Seizures Sister   . Colon cancer Neg Hx   . Inflammatory bowel disease Neg Hx   . Celiac disease Neg Hx     Social History   Social History  . Marital status: Legally Separated    Spouse name: N/A  . Number of children: 1  . Years of education: HS   Occupational History  . Unemployed    Social History Main Topics  . Smoking status: Current Every Day Smoker    Packs/day: 1.00    Years: 10.00    Types: Cigarettes  .  Smokeless tobacco: Never Used  . Alcohol use 0.0 oz/week     Comment: Occasionally   . Drug use: Yes    Types: Marijuana     Comment: Occasionally  . Sexual activity: Yes    Birth control/ protection: Surgical     Comment: tubal   Other Topics Concern  . Not on file   Social History Narrative   Lives at home with her boyfriend.   Right-handed.   Drinks approximately two 2-liter sodas per day.   Drinks 1 cup coffee per day.         ROS:  General: Negative for anorexia, weight   loss, fever, chills, fatigue, weakness. Eyes: Negative for vision changes.  ENT: Negative for hoarseness, difficulty swallowing , nasal congestion. CV: Negative for chest pain, angina, palpitations, dyspnea on exertion, peripheral edema.  Respiratory: Negative for dyspnea at rest, dyspnea on exertion, cough, sputum, wheezing.  GI: See history of present illness. GU:  Negative for dysuria, hematuria, urinary incontinence, urinary frequency, nocturnal urination.  MS:Complains of chronic joint pain. Derm: Negative for rash or itching.  Neuro: Negative for weakness, abnormal sensation, seizure, frequent headaches, memory loss, confusion.  Psych: History of anxiety, depression. Denies suicidal ideation, hallucinations.  Endo: Negative for unusual weight change.  Heme: Negative for bruising or bleeding. Allergy: Negative for rash or hives.    Physical Examination:  BP (!) 158/91   Pulse 76   Temp (!) 97.5 F (36.4 C) (Oral)   Ht 5' 8" (1.727 m)   Wt (!) 333 lb 9.6 oz (151.3 kg)   BMI 50.72 kg/m    General: Very pleasant morbidly obese Caucasian female in no acute distress  Head: Normocephalic, atraumatic.   Eyes: Conjunctiva pink, no icterus. Mouth: Oropharyngeal mucosa moist and pink , no lesions erythema or exudate. Neck: Supple without thyromegaly, masses, or lymphadenopathy.  Lungs: Clear to auscultation bilaterally.  Heart: Regular rate and rhythm, no murmurs rubs or gallops.  Abdomen:  Bowel sounds are normal,  mild to moderate epigastric tenderness nondistended, no hepatosplenomegaly or masses, no abdominal bruits or    hernia , no rebound or guarding.  Exam limited by body habitus. Mild rectus diastases Rectal: deferred Extremities: No lower extremity edema. No clubbing or deformities.  Neuro: Alert and oriented x 4 , grossly normal neurologically.  Skin: Warm and dry, no rash or jaundice.   Psych: Alert and cooperative, normal mood and affect.  Labs: Lab Results  Component Value Date   WBC 10.9 (H) 01/29/2017   HGB 11.5 (L) 01/29/2017   HCT 34.0 (L) 01/29/2017   MCV 90.7 01/29/2017   PLT 234 01/29/2017   Lab Results  Component Value Date   CREATININE 0.75 01/28/2017   BUN 9 01/28/2017   NA 137 01/28/2017   K 3.9 01/28/2017   CL 103 01/28/2017   CO2 26 01/28/2017   Lab Results  Component Value Date   ALT 56 (H) 01/28/2017   AST 35 01/28/2017   ALKPHOS 128 (H) 01/28/2017   BILITOT 0.2 (L) 01/28/2017     Imaging Studies: No results found.   

## 2017-03-17 NOTE — Transfer of Care (Signed)
Immediate Anesthesia Transfer of Care Note  Patient: Heidi [name]  Procedure(s) Performed: Procedure(s) with comments: COLONOSCOPY WITH PROPOFOL (N/A) - 9:30am ESOPHAGOGASTRODUODENOSCOPY (EGD) WITH PROPOFOL (N/A)  Patient Location: PACU  Anesthesia Type:MAC  Level of Consciousness: awake, alert , oriented and patient cooperative  Airway & Oxygen Therapy: Patient Spontanous Breathing  Post-op Assessment: Report given to RN and Post -op Vital signs reviewed and stable  Post vital signs: Reviewed and stable  Last Vitals:  Vitals:   03/17/17 0835 03/17/17 0840  BP: (!) 101/58   Resp: 20 16  Temp:      Last Pain:  Vitals:   03/17/17 0819  TempSrc: Oral         Complications: No apparent anesthesia complications

## 2017-03-17 NOTE — Discharge Instructions (Signed)
EGD Discharge instructions Please read the instructions outlined below and refer to this sheet in the next few weeks. These discharge instructions provide you with general information on caring for yourself after you leave the hospital. Your doctor may also give you specific instructions. While your treatment has been planned according to the most current medical practices available, unavoidable complications occasionally occur. If you have any problems or questions after discharge, please call your doctor. ACTIVITY  You may resume your regular activity but move at a slower pace for the next 24 hours.   Take frequent rest periods for the next 24 hours.   Walking will help expel (get rid of) the air and reduce the bloated feeling in your abdomen.   No driving for 24 hours (because of the anesthesia (medicine) used during the test).   You may shower.   Do not sign any important legal documents or operate any machinery for 24 hours (because of the anesthesia used during the test).  NUTRITION  Drink plenty of fluids.   You may resume your normal diet.   Begin with a light meal and progress to your normal diet.   Avoid alcoholic beverages for 24 hours or as instructed by your caregiver.  MEDICATIONS  You may resume your normal medications unless your caregiver tells you otherwise.  WHAT YOU CAN EXPECT TODAY  You may experience abdominal discomfort such as a feeling of fullness or gas pains.  FOLLOW-UP  Your doctor will discuss the results of your test with you.  SEEK IMMEDIATE MEDICAL ATTENTION IF ANY OF THE FOLLOWING OCCUR:  Excessive nausea (feeling sick to your stomach) and/or vomiting.   Severe abdominal pain and distention (swelling).   Trouble swallowing.   Temperature over 101 F (37.8 C).   Rectal bleeding or vomiting of blood.   Colonoscopy Discharge Instructions  Read the instructions outlined below and refer to this sheet in the next few weeks. These  discharge instructions provide you with general information on caring for yourself after you leave the hospital. Your doctor may also give you specific instructions. While your treatment has been planned according to the most current medical practices available, unavoidable complications occasionally occur. If you have any problems or questions after discharge, call Dr. Gala Romney at [phone]. ACTIVITY  You may resume your regular activity, but move at a slower pace for the next 24 hours.   Take frequent rest periods for the next 24 hours.   Walking will help get rid of the air and reduce the bloated feeling in your belly (abdomen).   No driving for 24 hours (because of the medicine (anesthesia) used during the test).    Do not sign any important legal documents or operate any machinery for 24 hours (because of the anesthesia used during the test).  NUTRITION  Drink plenty of fluids.   You may resume your normal diet as instructed by your doctor.   Begin with a light meal and progress to your normal diet. Heavy or fried foods are harder to digest and may make you feel sick to your stomach (nauseated).   Avoid alcoholic beverages for 24 hours or as instructed.  MEDICATIONS  You may resume your normal medications unless your doctor tells you otherwise.  WHAT YOU CAN EXPECT TODAY  Some feelings of bloating in the abdomen.   Passage of more gas than usual.   Spotting of blood in your stool or on the toilet paper.  IF YOU HAD POLYPS REMOVED DURING THE COLONOSCOPY:  No aspirin products for 7 days or as instructed.   No alcohol for 7 days or as instructed.   Eat a soft diet for the next 24 hours.  FINDING OUT THE RESULTS OF YOUR TEST Not all test results are available during your visit. If your test results are not back during the visit, make an appointment with your caregiver to find out the results. Do not assume everything is normal if you have not heard from your caregiver or the  medical facility. It is important for you to follow up on all of your test results.  SEEK IMMEDIATE MEDICAL ATTENTION IF:  You have more than a spotting of blood in your stool.   Your belly is swollen (abdominal distention).   You are nauseated or vomiting.   You have a temperature over 101.   You have abdominal pain or discomfort that is severe or gets worse throughout the day.    GERD information provided  Hemorrhoid information provided  You have large external hemorrhoids. I recommend you see a surgeon for consideration of follow-up hemorrhoidectomy  Begin Protonix 40 mg daily  Office visit with us in 6-8 weeks.

## 2017-03-17 NOTE — Anesthesia Procedure Notes (Signed)
Procedure Name: MAC Date/Time: 03/17/2017 8:49 AM Performed by: Andree Elk, Rubby Barbary A Pre-anesthesia Checklist: Patient identified, Emergency Drugs available, Suction available, Patient being monitored and Timeout performed Oxygen Delivery Method: Simple face mask

## 2017-03-17 NOTE — Op Note (Signed)
[facility] Patient Name: Heidi MyrtleJessica [name] Procedure Date: 03/17/2017 8:35 AM MRN: [id number] Date of Birth: 11/13/1990 Attending MD: Gennette Pacobert Michael Rourk , MD CSN: [id number] Age: [age] Admit Type: Outpatient Procedure:                Upper GI endoscopy Indications:              Epigastric abdominal pain Providers:                Gennette Pacobert Michael Rourk, MD, Loma MessingLurae B. Patsy LagerAlbert RN, RN,                            Dyann Ruddleonya Wilson Referring MD:             Elige RadonJoshua A. Dettinger Medicines:                Propofol per Anesthesia Complications:            No immediate complications. Estimated Blood Loss:     Estimated blood loss: none. Procedure:                Pre-Anesthesia Assessment:                           - Prior to the procedure, a History and Physical                            was performed, and patient medications and                            allergies were reviewed. The patient's tolerance of                            previous anesthesia was also reviewed. The risks                            and benefits of the procedure and the sedation                            options and risks were discussed with the patient.                            All questions were answered, and informed consent                            was obtained. Prior Anticoagulants: The patient has                            taken no previous anticoagulant or antiplatelet                            agents. ASA Grade Assessment: III - A patient with                            severe systemic disease. After reviewing the risks  and benefits, the patient was deemed in                            satisfactory condition to undergo the procedure.                           After obtaining informed consent, the endoscope was                            passed under direct vision. Throughout the                            procedure, the patient's blood pressure, pulse, and   oxygen saturations were monitored continuously. The                            [id number]) scope was introduced through the                            and advanced to the second part of duodenum. The                            upper GI endoscopy was accomplished without                            difficulty. The patient tolerated the procedure                            well. Scope In: 8:56:43 AM Scope Out: 9:00:42 AM Total Procedure Duration: 0 hours 3 minutes 59 seconds  Findings:      LA Grade B (one or more mucosal breaks greater than 5 mm, not extending       between the tops of two mucosal folds) esophagitis was found. No       Barrett's epithelium seen.      The cardia and gastric fundus were normal on retroflexion.      The duodenal bulb and second portion of the duodenum were normal. Impression:               - LA Grade B esophagitis.                           - Normal duodenal bulb and second portion of the                            duodenum.                           - No specimens collected. Moderate Sedation:      Moderate (conscious) sedation was personally administered by an       anesthesia professional. The following parameters were monitored: oxygen       saturation, heart rate, blood pressure, respiratory rate, EKG, adequacy       of pulmonary ventilation, and response to care. Total physician       intraservice time was 7 minutes. Recommendation:           -  Discharge patient to home.                           - Patient has a contact number available for                            emergencies. The signs and symptoms of potential                            delayed complications were discussed with the                            patient. Return to normal activities tomorrow.                            Written discharge instructions were provided to the                            patient.                           - Resume previous diet. Begin Protonix 40 mg  daily.                            Office visit with [place] in 6-8 weeks                           - Continue present medications.                           - No repeat upper endoscopy.                           - Return to GI office in 2 months. See colonoscopy                            report Procedure Code(s):        --- Professional ---                           [phone], Esophagogastroduodenoscopy, flexible,                            transoral; diagnostic, including collection of                            specimen(s) by brushing or washing, when performed                            (separate procedure) Diagnosis Code(s):        --- Professional ---                           K20.9, Esophagitis, unspecified                           R10.13, Epigastric  pain CPT copyright 2016 American Medical Association. All rights reserved. The codes documented in this report are preliminary and upon coder review may  be revised to meet current compliance requirements. [name], MD [name], MD 03/17/2017 9:23:09 AM This report has been signed electronically. Number of Addenda: 0

## 2017-03-17 NOTE — Op Note (Signed)
[facility] Patient Name: Heidi [name] Procedure Date: 03/17/2017 9:00 AM MRN: [id number] Date of Birth: 28-Apr-1991 Attending MD: [name] , MD CSN: [id number] Age: 26 Admit Type: Outpatient Procedure:                Colonoscopy Indications:              Hematochezia Providers:                [name], MD, Criselda Peaches. Patsy Lager, RN,                            Dyann Ruddle Referring MD:              Medicines:                Propofol per Anesthesia Complications:            No immediate complications. Estimated Blood Loss:     Estimated blood loss: none. Procedure:                Pre-Anesthesia Assessment:                           - Prior to the procedure, a History and Physical                            was performed, and patient medications and                            allergies were reviewed. The patient's tolerance of                            previous anesthesia was also reviewed. The risks                            and benefits of the procedure and the sedation                            options and risks were discussed with the patient.                            All questions were answered, and informed consent                            was obtained. Prior Anticoagulants: The patient has                            taken no previous anticoagulant or antiplatelet                            agents. ASA Grade Assessment: III - A patient with                            severe systemic disease. After reviewing the risks  and benefits, the patient was deemed in                            satisfactory condition to undergo the procedure.                           After obtaining informed consent, the colonoscope                            was passed under direct vision. Throughout the                            procedure, the patient's blood pressure, pulse, and                            oxygen saturations were monitored  continuously. The                            [id number] ([id number]) scope was introduced through                            the and advanced to the the cecum, identified by                            appendiceal orifice and ileocecal valve. The                            ileocecal valve, appendiceal orifice, and rectum                            were photographed. The entire colon was well                            visualized. The quality of the bowel preparation                            was adequate. Scope In: 9:06:03 AM Scope Out: 9:15:53 AM Scope Withdrawal Time: 0 hours 6 minutes 33 seconds  Total Procedure Duration: 0 hours 9 minutes 50 seconds  Findings:      The perianal exam findings include non-thrombosed external hemorrhoids       and internal hemorrhoids that do not return to the anal canal, thus       continuously prolapsed (Grade IV).      The colon (entire examined portion) appeared normal. Impression:               large and circumferential                           - Non-thrombosed external hemorrhoids and internal                            hemorrhoids that do not return to the anal canal,  thus continuously prolapsed (Grade IV) found on                            perianal exam.                           - The entire examined colon is normal.                           - No specimens collected. Moderate Sedation:      Moderate (conscious) sedation was personally administered by an       anesthesia professional. The following parameters were monitored: oxygen       saturation, heart rate, blood pressure, respiratory rate, EKG, adequacy       of pulmonary ventilation, and response to care. Total physician       intraservice time was 22 minutes. Recommendation:           - Patient has a contact number available for                            emergencies. The signs and symptoms of potential                            delayed complications were  discussed with the                            patient. Return to normal activities tomorrow.                            Written discharge instructions were provided to the                            patient.                           - Resume previous diet.                           - Continue present medications.                           - No repeat colonoscopy due to no evidence of                            mucosal or other abnormalities on today's exam. See                            EGD report. Surgical consultation for                            hemorrhoidectomy                           - Return to my office in 6 weeks. Procedure Code(s):        --- Professional ---  40981, Colonoscopy, flexible; diagnostic, including                            collection of specimen(s) by brushing or washing,                            when performed (separate procedure) Diagnosis Code(s):        --- Professional ---                           K64.3, Fourth degree hemorrhoids                           K64.4, Residual hemorrhoidal skin tags                           K92.1, Melena (includes Hematochezia) CPT copyright 2016 American Medical Association. All rights reserved. The codes documented in this report are preliminary and upon coder review may  be revised to meet current compliance requirements. Gerrit Friends. Walsie Smeltz, MD [name], MD 03/17/2017 9:27:05 AM This report has been signed electronically. Number of Addenda: 0

## 2017-03-17 NOTE — Interval H&P Note (Signed)
History and Physical Interval Note:  03/17/2017 8:44 AM  Heidi [name]  has presented today for surgery, with the diagnosis of epigastric pain, rectal bleeding, GERD, increased LFTs  The various methods of treatment have been discussed with the patient and family. After consideration of risks, benefits and other options for treatment, the patient has consented to  Procedure(s) with comments: COLONOSCOPY WITH PROPOFOL (N/A) - 9:30am ESOPHAGOGASTRODUODENOSCOPY (EGD) WITH PROPOFOL (N/A) as a surgical intervention .  The patient's history has been reviewed, patient examined, no change in status, stable for surgery.  I have reviewed the patient's chart and labs.  Questions were answered to the patient's satisfaction.     Heidi [name]  No change. Patient denies dysphagia. Diagnostic EGD colonoscopy for dyspepsia/reflux and rectal bleeding per plan.  The risks, benefits, limitations, imponderables and alternatives regarding both EGD and colonoscopy have been reviewed with the patient. Questions have been answered. All parties agreeable.

## 2017-03-17 NOTE — Anesthesia Preprocedure Evaluation (Signed)
Anesthesia Evaluation    Airway Mallampati: II  TM Distance: >3 FB Neck ROM: Full    Dental  (+) Teeth Intact, Partial Upper   Pulmonary Current Smoker,    breath sounds clear to auscultation       Cardiovascular hypertension,  Rhythm:Regular Rate:Normal     Neuro/Psych    GI/Hepatic GERD  ,  Endo/Other  diabetes, Type 2Morbid obesity  Renal/GU      Musculoskeletal   Abdominal   Peds  Hematology   Anesthesia Other Findings   Reproductive/Obstetrics                             Anesthesia Physical Anesthesia Plan  ASA: III  Anesthesia Plan: MAC   Post-op Pain Management:    Induction: Intravenous  PONV Risk Score and Plan:   Airway Management Planned: Simple Face Mask  Additional Equipment:   Intra-op Plan:   Post-operative Plan:   Informed Consent: I have reviewed the patients History and Physical, chart, labs and discussed the procedure including the risks, benefits and alternatives for the proposed anesthesia with the patient or authorized representative who has indicated his/her understanding and acceptance.     Plan Discussed with:   Anesthesia Plan Comments:         Anesthesia Quick Evaluation

## 2017-03-20 ENCOUNTER — Ambulatory Visit (INDEPENDENT_AMBULATORY_CARE_PROVIDER_SITE_OTHER): Payer: Medicaid Other | Admitting: Physician Assistant

## 2017-03-20 ENCOUNTER — Encounter: Payer: Self-pay | Admitting: Physician Assistant

## 2017-03-20 VITALS — BP 155/84 | HR 82 | Temp 97.8°F | Ht 68.0 in | Wt 327.4 lb

## 2017-03-20 DIAGNOSIS — N3000 Acute cystitis without hematuria: Secondary | ICD-10-CM

## 2017-03-20 DIAGNOSIS — R300 Dysuria: Secondary | ICD-10-CM

## 2017-03-20 LAB — URINALYSIS, COMPLETE
Bilirubin, UA: NEGATIVE
Ketones, UA: NEGATIVE
LEUKOCYTES UA: NEGATIVE
Nitrite, UA: NEGATIVE
PH UA: 6 (ref 5.0–7.5)
PROTEIN UA: NEGATIVE
RBC, UA: NEGATIVE
Specific Gravity, UA: 1.015 (ref 1.005–1.030)
Urobilinogen, Ur: 0.2 mg/dL (ref 0.2–1.0)

## 2017-03-20 LAB — MICROSCOPIC EXAMINATION: RENAL EPITHEL UA: NONE SEEN /HPF

## 2017-03-20 MED ORDER — SULFAMETHOXAZOLE-TRIMETHOPRIM 800-160 MG PO TABS: 1.0000 | ORAL_TABLET | Freq: Two times a day (BID) | ORAL | 0 refills | Status: DC

## 2017-03-20 NOTE — Patient Instructions (Signed)

## 2017-03-20 NOTE — Progress Notes (Signed)
lfts remain elevated, glucose up. Hep C is negative. She is immune to hep b. Her liver was normal on ct in 10/2016. She may have elevated lft due to medication effect or fatty liver.   Instructions for fatty liver: Recommend 1-2# weight loss per week until ideal body weight through exercise & diet. Low fat/cholesterol diet.   Avoid sweets, sodas, fruit juices, sweetened beverages like tea, etc. Gradually increase exercise from 15 min daily up to 1 hr per day 5 days/week. Limit alcohol use.  REPEAT LFTS IN 3 MONTHS.

## 2017-03-20 NOTE — Progress Notes (Signed)
BP (!) 155/84   Pulse 82   Temp 97.8 F (36.6 C) (Oral)   Ht 5\' 8"  (1.727 m)   Wt (!) 327 lb 6.4 oz (148.5 kg)   LMP 03/10/2017   BMI 49.78 kg/m    Subjective:    Patient ID: Heidi [name], female    DOB: 1991/06/01, 26 y.o.   MRN: [id number]  HPI: Heidi [name] is a 26 y.o. female presenting on 03/20/2017 for Urinary Tract Infection and Hemorrhoids  This patient has had several days of dysuria, frequency and nocturia. There is also pain over the bladder in the suprapubic region, no back pain. Denies leakage or hematuria.  Denies fever or chills. No pain in flank area.  Relevant past medical, surgical, family and social history reviewed and updated as indicated. Allergies and medications reviewed and updated.  Past Medical History:  Diagnosis Date  . Abdominal pain 01/10/2016  . Anxiety   . Back pain   . Bloating 01/10/2016  . Depression   . GERD (gastroesophageal reflux disease)   . Hematuria 01/10/2016  . Herpes simplex virus (HSV) infection   . Hypertension   . Osteoarthritis   . Prediabetes   . Schizo-affective schizophrenia (HCC)   . Type 2 diabetes mellitus (HCC) 03/28/2016  . Urinary frequency 01/10/2016    Past Surgical History:  Procedure Laterality Date  . CESAREAN SECTION    . DILATION AND CURETTAGE OF UTERUS    . ESSURE TUBAL LIGATION      Review of Systems  Constitutional: Negative.   HENT: Negative.   Eyes: Negative.   Respiratory: Negative.   Gastrointestinal: Negative.   Genitourinary: Positive for difficulty urinating, dysuria and urgency. Negative for flank pain.    Allergies as of 03/20/2017      Reactions   Hydroxyzine    Causes her more anxiety.   Linzess [linaclotide] Hives   Lorazepam    Sedation   Morphine And Related Other (See Comments)   Makes pt.aggressive   Quetiapine    Sweating, irritability      Medication List       Accurate as of 03/20/17  5:25 PM. Always use your most recent med list.          albuterol 108 (90  Base) MCG/ACT inhaler Commonly known as:  PROVENTIL HFA;VENTOLIN HFA Inhale 2 puffs into the lungs every 6 (six) hours as needed for wheezing or shortness of breath.   ALPRAZolam 0.25 MG tablet Commonly known as:  XANAX Take 1 tablet (0.25 mg total) by mouth 2 (two) times daily as needed for anxiety.   amLODipine 5 MG tablet Commonly known as:  NORVASC Take 1 tablet (5 mg total) by mouth daily.   cholecalciferol 1000 units tablet Commonly known as:  VITAMIN D Take 1,000 Units by mouth at bedtime.   cyclobenzaprine 10 MG tablet Commonly known as:  FLEXERIL Take 1 tablet (10 mg total) by mouth 3 (three) times daily as needed for muscle spasms.   esomeprazole 40 MG capsule Commonly known as:  NEXIUM Take 1 capsule (40 mg total) by mouth daily.   glucose blood test strip Commonly known as:  ACCU-CHEK GUIDE Use to check BG once daily at varying times of day.   Lancets 30G Misc Dispense Accu-Chek guide lancets.  Check blood sugar at varying times once per day.   lisinopril 20 MG tablet Commonly known as:  PRINIVIL,ZESTRIL TAKE ONE TABLET BY MOUTH ONCE DAILY   meloxicam 7.5 MG tablet Commonly known as:  MOBIC Take 2 tablets (15 mg total) by mouth daily.   metFORMIN 500 MG tablet Commonly known as:  GLUCOPHAGE Take 1 tablet (500 mg total) by mouth 2 (two) times daily with a meal.   OLANZapine 10 MG tablet Commonly known as:  ZYPREXA Take 1 tablet (10 mg total) by mouth at bedtime.   sertraline 100 MG tablet Commonly known as:  ZOLOFT Take 2 tablets (200 mg total) by mouth at bedtime.   sulfamethoxazole-trimethoprim 800-160 MG tablet Commonly known as:  BACTRIM DS Take 1 tablet by mouth 2 (two) times daily.   traZODone 50 MG tablet Commonly known as:  DESYREL Take 1 tablet (50 mg total) by mouth at bedtime as needed for sleep.   triamcinolone cream 0.1 % Commonly known as:  KENALOG Apply 1 application topically 2 (two) times daily as needed (for rash/irritated  skin.).          Objective:    BP (!) 155/84   Pulse 82   Temp 97.8 F (36.6 C) (Oral)   Ht 5\' 8"  (1.727 m)   Wt (!) 327 lb 6.4 oz (148.5 kg)   LMP 03/10/2017   BMI 49.78 kg/m   Allergies  Allergen Reactions  . Hydroxyzine     Causes her more anxiety.  Heidi [name]. Linzess [Linaclotide] Hives  . Lorazepam     Sedation  . Morphine And Related Other (See Comments)    Makes pt.aggressive  . Quetiapine     Sweating, irritability    Physical Exam  Constitutional: She is oriented to person, place, and time. She appears well-developed and well-nourished.  HENT:  Head: Normocephalic and atraumatic.  Eyes: Pupils are equal, round, and reactive to light. Conjunctivae are normal.  Cardiovascular: Normal rate, regular rhythm, normal heart sounds and intact distal pulses.   Pulmonary/Chest: Effort normal and breath sounds normal.  Abdominal: Soft. Bowel sounds are normal. She exhibits no distension and no mass. There is tenderness in the suprapubic area. There is no rebound, no guarding and no CVA tenderness.  Neurological: She is alert and oriented to person, place, and time. She has normal reflexes.  Skin: Skin is warm and dry. No rash noted.  Psychiatric: She has a normal mood and affect. Her behavior is normal. Judgment and thought content normal.    Results for orders placed or performed during the hospital encounter of 03/17/17  Pregnancy, urine  Result Value Ref Range   Preg Test, Ur NEGATIVE NEGATIVE  Glucose, capillary  Result Value Ref Range   Glucose-Capillary 190 (H) 65 - 99 mg/dL  Glucose, capillary  Result Value Ref Range   Glucose-Capillary 191 (H) 65 - 99 mg/dL      Assessment & Plan:   1. Dysuria - Urine Culture - Urinalysis, Complete  2. Acute cystitis without hematuria - sulfamethoxazole-trimethoprim (BACTRIM DS) 800-160 MG tablet; Take 1 tablet by mouth 2 (two) times daily.  Dispense: 14 tablet; Refill: 0  . Current Outpatient Prescriptions:  .  albuterol  (PROVENTIL HFA;VENTOLIN HFA) 108 (90 Base) MCG/ACT inhaler, Inhale 2 puffs into the lungs every 6 (six) hours as needed for wheezing or shortness of breath., Disp: 1 Inhaler, Rfl: 0 .  ALPRAZolam (XANAX) 0.25 MG tablet, Take 1 tablet (0.25 mg total) by mouth 2 (two) times daily as needed for anxiety., Disp: 10 tablet, Rfl: 2 .  amLODipine (NORVASC) 5 MG tablet, Take 1 tablet (5 mg total) by mouth daily. (Patient taking differently: Take 5 mg by mouth at bedtime. ), Disp: 90  tablet, Rfl: 1 .  cholecalciferol (VITAMIN D) 1000 units tablet, Take 1,000 Units by mouth at bedtime. , Disp: , Rfl:  .  cyclobenzaprine (FLEXERIL) 10 MG tablet, Take 1 tablet (10 mg total) by mouth 3 (three) times daily as needed for muscle spasms., Disp: 30 tablet, Rfl: 2 .  esomeprazole (NEXIUM) 40 MG capsule, Take 1 capsule (40 mg total) by mouth daily. (Patient taking differently: Take 40 mg by mouth at bedtime. ), Disp: 30 capsule, Rfl: 1 .  glucose blood (ACCU-CHEK GUIDE) test strip, Use to check BG once daily at varying times of day., Disp: 100 each, Rfl: 3 .  Lancets 30G MISC, Dispense Accu-Chek guide lancets.  Check blood sugar at varying times once per day., Disp: 100 each, Rfl: 11 .  lisinopril (PRINIVIL,ZESTRIL) 20 MG tablet, TAKE ONE TABLET BY MOUTH ONCE DAILY, Disp: 30 tablet, Rfl: 5 .  meloxicam (MOBIC) 7.5 MG tablet, Take 2 tablets (15 mg total) by mouth daily. (Patient taking differently: Take 15 mg by mouth at bedtime. ), Disp: 60 tablet, Rfl: 2 .  metFORMIN (GLUCOPHAGE) 500 MG tablet, Take 1 tablet (500 mg total) by mouth 2 (two) times daily with a meal. (Patient taking differently: Take 500 mg by mouth at bedtime. ), Disp: 180 tablet, Rfl: 1 .  OLANZapine (ZYPREXA) 10 MG tablet, Take 1 tablet (10 mg total) by mouth at bedtime., Disp: 90 tablet, Rfl: 1 .  sertraline (ZOLOFT) 100 MG tablet, Take 2 tablets (200 mg total) by mouth at bedtime., Disp: 180 tablet, Rfl: 2 .  traZODone (DESYREL) 50 MG tablet, Take 1  tablet (50 mg total) by mouth at bedtime as needed for sleep., Disp: 30 tablet, Rfl: 2 .  triamcinolone cream (KENALOG) 0.1 %, Apply 1 application topically 2 (two) times daily as needed (for rash/irritated skin.)., Disp: , Rfl:  .  sulfamethoxazole-trimethoprim (BACTRIM DS) 800-160 MG tablet, Take 1 tablet by mouth 2 (two) times daily., Disp: 14 tablet, Rfl: 0  Continue all other maintenance medications as listed above.  Follow up plan: No Follow-up on file.  Educational handout given for uti  Remus Loffler PA-C [facility] [address]  [place], [place] [address] [phone]   03/20/2017, 5:25 PM

## 2017-03-23 LAB — URINE CULTURE

## 2017-03-24 ENCOUNTER — Other Ambulatory Visit: Payer: Self-pay

## 2017-03-24 DIAGNOSIS — R945 Abnormal results of liver function studies: Secondary | ICD-10-CM

## 2017-03-24 DIAGNOSIS — R7989 Other specified abnormal findings of blood chemistry: Secondary | ICD-10-CM

## 2017-03-24 MED ORDER — CIPROFLOXACIN HCL 500 MG PO TABS: 500.0000 mg | ORAL_TABLET | Freq: Two times a day (BID) | ORAL | 0 refills | Status: DC

## 2017-03-24 NOTE — Addendum Note (Signed)
Addended by: Tamera PuntWRAY, WENDY S on: 03/24/2017 10:48 AM   Modules accepted: Orders

## 2017-03-25 ENCOUNTER — Ambulatory Visit (INDEPENDENT_AMBULATORY_CARE_PROVIDER_SITE_OTHER): Payer: Medicaid Other | Admitting: Family Medicine

## 2017-03-25 ENCOUNTER — Encounter: Payer: Self-pay | Admitting: Family Medicine

## 2017-03-25 VITALS — BP 138/79 | HR 86 | Temp 98.9°F | Ht 68.0 in | Wt 330.0 lb

## 2017-03-25 DIAGNOSIS — R109 Unspecified abdominal pain: Secondary | ICD-10-CM | POA: Diagnosis not present

## 2017-03-25 NOTE — Progress Notes (Signed)
BP (!) 141/86   Pulse 86   Temp 98.9 F (37.2 C) (Oral)   Ht 5\' 8"  (1.727 m)   Wt (!) 330 lb (149.7 kg)   LMP 03/10/2017   BMI 50.18 kg/m    Subjective:    Patient ID: Heidi [name], female    DOB: 11-19-1990, 26 y.o.   MRN: [id number]  HPI: Heidi [name] is a 26 y.o. female presenting on 03/25/2017 for Discuss options for removal and side effects of Essure   HPI Continued abdominal pain and inflammation  Patient comes in today complaining of continued abdominal pain and elevated inflammatory markers. She had a sure placed 4 years ago and since that time she's been having a lot of issues with them and has gone once previously to see if they need to be removed. She had ultrasound severe and place but she still thinks she might have developed a nickel allergy and is having a lot of problems and inflammation related to the [place] device. She would like to have a tubal ligation and have them removed. She denies any vaginal bleeding or discharge. She denies any constipation or diarrhea currently. She denies any blood in her stool. She denies any nausea or vomiting. She describes the abdominal pain as lower pelvic and bilateral.  Relevant past medical, surgical, family and social history reviewed and updated as indicated. Interim medical history since our last visit reviewed. Allergies and medications reviewed and updated.  Review of Systems  Constitutional: Negative for chills and fever.  Respiratory: Negative for chest tightness and shortness of breath.   Cardiovascular: Negative for chest pain and leg swelling.  Gastrointestinal: Positive for abdominal pain. Negative for constipation, diarrhea, nausea and vomiting.  Genitourinary: Positive for pelvic pain. Negative for decreased urine volume, difficulty urinating, dysuria, flank pain, frequency, menstrual problem, vaginal bleeding, vaginal discharge and vaginal pain.  Musculoskeletal: Negative for back pain and gait problem.  Skin:  Negative for rash.  Psychiatric/Behavioral: Negative for agitation and behavioral problems.  All other systems reviewed and are negative.   Per HPI unless specifically indicated above   Allergies as of 03/25/2017      Reactions   Hydroxyzine    Causes her more anxiety.   Linzess [linaclotide] Hives   Lorazepam    Sedation   Morphine And Related Other (See Comments)   Makes pt.aggressive   Quetiapine    Sweating, irritability      Medication List       Accurate as of 03/25/17  4:08 PM. Always use your most recent med list.          albuterol 108 (90 Base) MCG/ACT inhaler Commonly known as:  PROVENTIL HFA;VENTOLIN HFA Inhale 2 puffs into the lungs every 6 (six) hours as needed for wheezing or shortness of breath.   ALPRAZolam 0.25 MG tablet Commonly known as:  XANAX Take 1 tablet (0.25 mg total) by mouth 2 (two) times daily as needed for anxiety.   amLODipine 5 MG tablet Commonly known as:  NORVASC Take 1 tablet (5 mg total) by mouth daily.   cholecalciferol 1000 units tablet Commonly known as:  VITAMIN D Take 1,000 Units by mouth at bedtime.   ciprofloxacin 500 MG tablet Commonly known as:  CIPRO Take 1 tablet (500 mg total) by mouth 2 (two) times daily.   cyclobenzaprine 10 MG tablet Commonly known as:  FLEXERIL Take 1 tablet (10 mg total) by mouth 3 (three) times daily as needed for muscle spasms.   esomeprazole 40  MG capsule Commonly known as:  NEXIUM Take 1 capsule (40 mg total) by mouth daily.   glucose blood test strip Commonly known as:  ACCU-CHEK GUIDE Use to check BG once daily at varying times of day.   Lancets 30G Misc Dispense Accu-Chek guide lancets.  Check blood sugar at varying times once per day.   lisinopril 20 MG tablet Commonly known as:  PRINIVIL,ZESTRIL TAKE ONE TABLET BY MOUTH ONCE DAILY   meloxicam 7.5 MG tablet Commonly known as:  MOBIC Take 2 tablets (15 mg total) by mouth daily.   metFORMIN 500 MG tablet Commonly known  as:  GLUCOPHAGE Take 1 tablet (500 mg total) by mouth 2 (two) times daily with a meal.   OLANZapine 10 MG tablet Commonly known as:  ZYPREXA Take 1 tablet (10 mg total) by mouth at bedtime.   sertraline 100 MG tablet Commonly known as:  ZOLOFT Take 2 tablets (200 mg total) by mouth at bedtime.   sulfamethoxazole-trimethoprim 800-160 MG tablet Commonly known as:  BACTRIM DS Take 1 tablet by mouth 2 (two) times daily.   traZODone 50 MG tablet Commonly known as:  DESYREL Take 1 tablet (50 mg total) by mouth at bedtime as needed for sleep.   triamcinolone cream 0.1 % Commonly known as:  KENALOG Apply 1 application topically 2 (two) times daily as needed (for rash/irritated skin.).          Objective:    BP (!) 141/86   Pulse 86   Temp 98.9 F (37.2 C) (Oral)   Ht 5\' 8"  (1.727 m)   Wt (!) 330 lb (149.7 kg)   LMP 03/10/2017   BMI 50.18 kg/m   Wt Readings from Last 3 Encounters:  03/25/17 (!) 330 lb (149.7 kg)  03/20/17 (!) 327 lb 6.4 oz (148.5 kg)  03/14/17 (!) 332 lb (150.6 kg)    Physical Exam  Constitutional: She is oriented to person, place, and time. She appears well-developed and well-nourished. No distress.  Eyes: Conjunctivae are normal.  Cardiovascular: Normal rate, regular rhythm, normal heart sounds and intact distal pulses.   No murmur heard. Pulmonary/Chest: Effort normal and breath sounds normal. No respiratory distress. She has no wheezes. She has no rales.  Abdominal: Soft. Bowel sounds are normal. She exhibits no distension. There is tenderness (Mild suprapubic and right lower left lower quadrant abdominal tenderness, no CVA tenderness). There is no rebound and no guarding.  Musculoskeletal: Normal range of motion.  Neurological: She is alert and oriented to person, place, and time. Coordination normal.  Skin: Skin is warm and dry. No rash noted. She is not diaphoretic.  Psychiatric: She has a normal mood and affect. Her behavior is normal.  Nursing  note and vitals reviewed.       Assessment & Plan:   Problem List Items Addressed This Visit    None    Visit Diagnoses    Abdominal pain, unspecified abdominal location    -  Primary   Patient has continued abdominal pain and elevated white blood cell count, she thinks it may be due to a nickel allergy from a sure and wants them removed   Relevant Orders   Ambulatory referral to Obstetrics / Gynecology       Follow up plan: Return if symptoms worsen or fail to improve.  Counseling provided for all of the vaccine components Orders Placed This Encounter  Procedures  . Ambulatory referral to Obstetrics / Gynecology    Arville Care, MD [facility] 03/25/2017, 4:08 PM

## 2017-03-26 ENCOUNTER — Encounter (HOSPITAL_COMMUNITY): Payer: Self-pay | Admitting: Internal Medicine

## 2017-03-28 ENCOUNTER — Other Ambulatory Visit: Payer: Self-pay | Admitting: Family Medicine

## 2017-03-28 DIAGNOSIS — M545 Low back pain, unspecified: Secondary | ICD-10-CM

## 2017-03-28 DIAGNOSIS — G8929 Other chronic pain: Secondary | ICD-10-CM

## 2017-04-09 ENCOUNTER — Ambulatory Visit (INDEPENDENT_AMBULATORY_CARE_PROVIDER_SITE_OTHER): Payer: Medicaid Other | Admitting: Obstetrics and Gynecology

## 2017-04-09 VITALS — BP 140/88 | HR 82 | Ht 68.0 in | Wt 326.4 lb

## 2017-04-09 DIAGNOSIS — R102 Pelvic and perineal pain: Secondary | ICD-10-CM

## 2017-04-10 ENCOUNTER — Encounter: Payer: Self-pay | Admitting: Obstetrics and Gynecology

## 2017-04-14 ENCOUNTER — Encounter: Payer: Self-pay | Admitting: *Deleted

## 2017-04-14 NOTE — Progress Notes (Addendum)
[facility] Visit  04/09/17            Patient name: Heidi [name] MRN [id number]  Date of birth: 1991-07-20  CC & HPI:  Heidi [name] is a 26 y.o. female presenting today for discussing essure removal. Pt had an essure tubal ligation on 02/14 at the [facility] in [place], [place], 3 months after delivering her son. Essure placement was confirmed in 05/14, and she had the depo provera shot during the three month gap. She states she bled every day during the three month period. She has noted worsening cramps, bleeding during ovulation, thicker mucous during ovulation, constant pelvic and hip pain. Pt keeps a record of her S/S in a journal, which she brought today. She is very distressed over her symptoms and wants to remove the Essure. She states she has been out of work for 2.5 years due to the constant hip/back pain, and has had multiple misdiagnoses in the past. She has a hx of diabetes and takes metformin. Pt weighed 250 lbs before having her son.  Pt is also c/o itching around clitoris and vaginal opening. She has associate symptoms of slight vaginal swelling.   ROS:  ROS  +suprapubic pain   Pertinent History Reviewed:   Reviewed: Significant for Essure tubal ligation, c-section, hematuria, abd. Pain, D&C of uterus Medical         Past Medical History:  Diagnosis Date  . Abdominal pain 01/10/2016  . Anxiety   . Back pain   . Bloating 01/10/2016  . Depression   . GERD (gastroesophageal reflux disease)   . Hematuria 01/10/2016  . Herpes simplex virus (HSV) infection   . Hypertension   . Osteoarthritis   . Prediabetes   . Schizo-affective schizophrenia (HCC)   . Type 2 diabetes mellitus (HCC) 03/28/2016  . Urinary frequency 01/10/2016                              Surgical Hx:    Past Surgical History:  Procedure Laterality Date  . CESAREAN SECTION    . COLONOSCOPY WITH PROPOFOL N/A 03/17/2017   Procedure: COLONOSCOPY WITH PROPOFOL;  Surgeon: Corbin Ade, MD;  [facility];  Service: Endoscopy;  Laterality: N/A;  9:30am  . DILATION AND CURETTAGE OF UTERUS    . ESOPHAGOGASTRODUODENOSCOPY (EGD) WITH PROPOFOL N/A 03/17/2017   Procedure: ESOPHAGOGASTRODUODENOSCOPY (EGD) WITH PROPOFOL;  Surgeon: Corbin Ade, MD;  Location: [facility];  Service: Endoscopy;  Laterality: N/A;  . ESSURE TUBAL LIGATION     Medications: Reviewed & Updated - see associated section                       Current Outpatient Prescriptions:  .  albuterol (PROVENTIL HFA;VENTOLIN HFA) 108 (90 Base) MCG/ACT inhaler, Inhale 2 puffs into the lungs every 6 (six) hours as needed for wheezing or shortness of breath., Disp: 1 Inhaler, Rfl: 0 .  ALPRAZolam (XANAX) 0.25 MG tablet, Take 1 tablet (0.25 mg total) by mouth 2 (two) times daily as needed for anxiety., Disp: 10 tablet, Rfl: 2 .  amLODipine (NORVASC) 5 MG tablet, Take 1 tablet (5 mg total) by mouth daily. (Patient taking differently: Take 5 mg by mouth at bedtime. ), Disp: 90 tablet, Rfl: 1 .  cholecalciferol (VITAMIN D) 1000 units tablet, Take 1,000 Units by mouth at bedtime. , Disp: , Rfl:  .  cyclobenzaprine (  FLEXERIL) 10 MG tablet, TAKE 1 TABLET BY MOUTH THREE TIMES DAILY AS NEEDED FOR MUSCLE SPASM, Disp: 30 tablet, Rfl: 2 .  glucose blood (ACCU-CHEK GUIDE) test strip, Use to check BG once daily at varying times of day., Disp: 100 each, Rfl: 3 .  Lancets 30G MISC, Dispense Accu-Chek guide lancets.  Check blood sugar at varying times once per day., Disp: 100 each, Rfl: 11 .  lisinopril (PRINIVIL,ZESTRIL) 20 MG tablet, TAKE ONE TABLET BY MOUTH ONCE DAILY, Disp: 30 tablet, Rfl: 5 .  meloxicam (MOBIC) 7.5 MG tablet, TAKE 2 TABLETS BY MOUTH ONCE DAILY, Disp: 60 tablet, Rfl: 2 .  metFORMIN (GLUCOPHAGE) 500 MG tablet, Take 1 tablet (500 mg total) by mouth 2 (two) times daily with a meal. (Patient taking differently: Take 500 mg by mouth at bedtime. ), Disp: 180 tablet, Rfl: 1 .  OLANZapine (ZYPREXA) 10 MG tablet,  Take 1 tablet (10 mg total) by mouth at bedtime., Disp: 90 tablet, Rfl: 1 .  pantoprazole (PROTONIX) 40 MG tablet, Take 40 mg by mouth daily., Disp: , Rfl:  .  sertraline (ZOLOFT) 100 MG tablet, Take 2 tablets (200 mg total) by mouth at bedtime., Disp: 180 tablet, Rfl: 2 .  traZODone (DESYREL) 50 MG tablet, Take 1 tablet (50 mg total) by mouth at bedtime as needed for sleep., Disp: 30 tablet, Rfl: 2 .  triamcinolone cream (KENALOG) 0.1 %, Apply 1 application topically 2 (two) times daily as needed (for rash/irritated skin.)., Disp: , Rfl:    Social History: Reviewed -  reports that she has been smoking Cigarettes.  She has a 10.00 pack-year smoking history. She has never used smokeless tobacco.  Objective Findings:  Vitals: Blood pressure 140/88, pulse 82, height 5\' 8"  (1.727 m), weight (!) 326 lb 6.4 oz (148.1 kg), last menstrual period 03/10/2017. Body mass index is 49.63 kg/m.  Physical Examination: General appearance - alert, well appearing, and in no distress Mental status - alert, oriented to person, place, and time Pelvic -  VULVA: vulvar erythema with red bumps around hair follicles,  VAGINA: normal appearing vagina with normal color and discharge, no lesions,  CERVIX: not done  UTERUS: not done  ADNEXA: not done Rectal exam: red bumps around anus, satellite lesions present  Negative for yeast cells    Assessment & Plan:   A:  1. Monilial vulvovaginits  2. Morbid obesity 3. Alleged pelvic pain for which patient seems unusually dramatic 4. History of Essure sterilization system placed, [place]:  1. Diflucan tablets, 150 mg Q 3 days x3, 1x refill, Rx Mytrex cream 2. F/u in 2-3 weeks [facility] [place]   By signing my name below, I, Redge GainerIzna Ahmed, attest that this documentation has been prepared under the direction and in the presence of Tilda BurrowFerguson, Azadeh Hyder V, MD. Electronically Signed: Redge GainerIzna Ahmed, Medical Scribe.  04/14/17. 1:21 PM.  I personally performed the services described in this documentation, which was SCRIBED in my presence. The recorded information has been reviewed and considered accurate. It has been edited as necessary during review. Tilda BurrowFERGUSON,Lennyn Bellanca V, MD

## 2017-04-15 ENCOUNTER — Encounter: Payer: Self-pay | Admitting: Family Medicine

## 2017-04-15 ENCOUNTER — Encounter: Payer: Self-pay | Admitting: Gastroenterology

## 2017-04-15 DIAGNOSIS — E119 Type 2 diabetes mellitus without complications: Secondary | ICD-10-CM

## 2017-04-15 DIAGNOSIS — R102 Pelvic and perineal pain unspecified side: Secondary | ICD-10-CM | POA: Insufficient documentation

## 2017-04-16 ENCOUNTER — Other Ambulatory Visit: Payer: Self-pay | Admitting: Gastroenterology

## 2017-04-16 MED ORDER — METFORMIN HCL 500 MG PO TABS: 500.0000 mg | ORAL_TABLET | Freq: Every day | ORAL | 0 refills | Status: DC

## 2017-04-16 MED ORDER — DEXLANSOPRAZOLE 60 MG PO CPDR: 60.0000 mg | DELAYED_RELEASE_CAPSULE | Freq: Every day | ORAL | 11 refills | Status: DC

## 2017-04-18 ENCOUNTER — Encounter: Payer: Self-pay | Admitting: Family Medicine

## 2017-04-18 ENCOUNTER — Ambulatory Visit (INDEPENDENT_AMBULATORY_CARE_PROVIDER_SITE_OTHER): Payer: Medicaid Other | Admitting: Family Medicine

## 2017-04-18 ENCOUNTER — Encounter: Payer: Self-pay | Admitting: Gastroenterology

## 2017-04-18 VITALS — BP 139/84 | HR 100 | Temp 98.4°F | Ht 68.0 in | Wt 319.0 lb

## 2017-04-18 DIAGNOSIS — J189 Pneumonia, unspecified organism: Secondary | ICD-10-CM

## 2017-04-18 MED ORDER — ALBUTEROL SULFATE HFA 108 (90 BASE) MCG/ACT IN AERS: 2.0000 | INHALATION_SPRAY | Freq: Four times a day (QID) | RESPIRATORY_TRACT | 2 refills | Status: DC | PRN

## 2017-04-18 MED ORDER — AZITHROMYCIN 250 MG PO TABS: ORAL_TABLET | ORAL | 0 refills | Status: DC

## 2017-04-18 NOTE — Progress Notes (Signed)
BP 139/84   Pulse 100   Temp 98.4 F (36.9 C) (Oral)   Ht 5\' 8"  (1.727 m)   Wt (!) 319 lb (144.7 kg)   BMI 48.50 kg/m    Subjective:    Patient ID: Heidi [name], female    DOB: 1991-04-22, 26 y.o.   MRN: [id number]  HPI: Heidi [name] is a 26 y.o. female presenting on 04/18/2017 for Sinusitis (nasal congestion, sinus pain, headache, chills; symptoms began yesterday) and Cough   HPI Cough and congestion and sinus pressure Agent comes in today with complaints of cough and congestion and sinus pressure and headache and chills that all began yesterday and has worsened overnight. She said it kept her up all night and she was unable to sleep. She doesn't that she is still smoking. She is also had an albuterol inhaler in the past but she lost it and does not have it currently for this episode. She denies any sick contacts that she knows of. She denies any shortness of breath but feels like she may have had some wheezing last night. she denies any fevers  Relevant past medical, surgical, family and social history reviewed and updated as indicated. Interim medical history since our last visit reviewed. Allergies and medications reviewed and updated.  Review of Systems  Constitutional: Positive for chills. Negative for fever.  HENT: Positive for congestion, postnasal drip, rhinorrhea, sinus pressure, sneezing and sore throat. Negative for ear discharge and ear pain.   Eyes: Negative for pain, redness and visual disturbance.  Respiratory: Positive for cough and wheezing. Negative for chest tightness and shortness of breath.   Cardiovascular: Negative for chest pain and leg swelling.  Genitourinary: Negative for difficulty urinating and dysuria.  Musculoskeletal: Negative for back pain and gait problem.  Skin: Negative for rash.  Neurological: Negative for light-headedness and headaches.  Psychiatric/Behavioral: Negative for agitation and behavioral problems.  All other systems reviewed and  are negative.   Per HPI unless specifically indicated above        Objective:    BP 139/84   Pulse 100   Temp 98.4 F (36.9 C) (Oral)   Ht 5\' 8"  (1.727 m)   Wt (!) 319 lb (144.7 kg)   BMI 48.50 kg/m   Wt Readings from Last 3 Encounters:  04/18/17 (!) 319 lb (144.7 kg)  04/10/17 (!) 326 lb 6.4 oz (148.1 kg)  03/25/17 (!) 330 lb (149.7 kg)    Physical Exam  Constitutional: She is oriented to person, place, and time. She appears well-developed and well-nourished. No distress.  HENT:  Right Ear: Tympanic membrane, external ear and ear canal normal.  Left Ear: Tympanic membrane, external ear and ear canal normal.  Nose: Mucosal edema and rhinorrhea present. No epistaxis. Right sinus exhibits maxillary sinus tenderness and frontal sinus tenderness. Left sinus exhibits maxillary sinus tenderness and frontal sinus tenderness.  Mouth/Throat: Uvula is midline and mucous membranes are normal. Posterior oropharyngeal edema and posterior oropharyngeal erythema present. No oropharyngeal exudate or tonsillar abscesses.  Eyes: Conjunctivae and EOM are normal.  Cardiovascular: Normal rate, regular rhythm, normal heart sounds and intact distal pulses.   No murmur heard. Pulmonary/Chest: Effort normal. No respiratory distress. She has no decreased breath sounds. She has no wheezes. She has rhonchi. She has no rales.  Musculoskeletal: Normal range of motion. She exhibits no edema or tenderness.  Neurological: She is alert and oriented to person, place, and time. Coordination normal.  Skin: Skin is warm and dry. No rash noted.  She is not diaphoretic.  Psychiatric: She has a normal mood and affect. Her behavior is normal.  Vitals reviewed.       Assessment & Plan:   Problem List Items Addressed This Visit    None    Visit Diagnoses    Atypical pneumonia    -  Primary   Relevant Medications   azithromycin (ZITHROMAX) 250 MG tablet   albuterol (PROVENTIL HFA;VENTOLIN HFA) 108 (90 Base)  MCG/ACT inhaler       Follow up plan: Return if symptoms worsen or fail to improve.  Counseling provided for all of the vaccine components No orders of the defined types were placed in this encounter.   Arville Care, MD [facility] 04/18/2017, 11:33 AM

## 2017-04-21 ENCOUNTER — Encounter: Payer: Self-pay | Admitting: Internal Medicine

## 2017-04-21 ENCOUNTER — Encounter (HOSPITAL_COMMUNITY): Payer: Self-pay | Admitting: Emergency Medicine

## 2017-04-21 ENCOUNTER — Encounter: Payer: Self-pay | Admitting: Gastroenterology

## 2017-04-21 ENCOUNTER — Emergency Department (HOSPITAL_COMMUNITY)
Admission: EM | Admit: 2017-04-21 | Discharge: 2017-04-21 | Disposition: A | Discharge status: Home / Self Care | Payer: Medicaid Other | Attending: Emergency Medicine | Admitting: Emergency Medicine

## 2017-04-21 ENCOUNTER — Telehealth: Payer: Self-pay | Admitting: Internal Medicine

## 2017-04-21 DIAGNOSIS — K649 Unspecified hemorrhoids: Secondary | ICD-10-CM | POA: Insufficient documentation

## 2017-04-21 DIAGNOSIS — K6289 Other specified diseases of anus and rectum: Secondary | ICD-10-CM | POA: Insufficient documentation

## 2017-04-21 DIAGNOSIS — E119 Type 2 diabetes mellitus without complications: Secondary | ICD-10-CM | POA: Insufficient documentation

## 2017-04-21 DIAGNOSIS — F17210 Nicotine dependence, cigarettes, uncomplicated: Secondary | ICD-10-CM | POA: Diagnosis not present

## 2017-04-21 DIAGNOSIS — I10 Essential (primary) hypertension: Secondary | ICD-10-CM | POA: Insufficient documentation

## 2017-04-21 DIAGNOSIS — Z79899 Other long term (current) drug therapy: Secondary | ICD-10-CM | POA: Insufficient documentation

## 2017-04-21 MED ORDER — LIDOCAINE HCL 2 % EX GEL: 1.0000 "application " | CUTANEOUS | 0 refills | Status: DC | PRN

## 2017-04-21 MED ORDER — LIDOCAINE HCL 2 % EX GEL
CUTANEOUS | Status: AC
  Filled 2017-04-21: qty 10

## 2017-04-21 MED ORDER — LIDOCAINE HCL 2 % EX GEL
1.0000 "application " | Freq: Once | CUTANEOUS | Status: AC
  Administered 2017-04-21: 1

## 2017-04-21 NOTE — Telephone Encounter (Signed)
Spoke with the pt, she said she had some bleeding after her tcs for about 2 weeks, then the bleeding stopped and now she is in excruciating pain in her rectum. She said the hemorrhoids were so large that she was having a hard time getting the cream in. I spoke with AB because LSL had already left for the day, she recommended the pt go to the ED or possibly an urgent care,  in case it was a thrombosed hemorrhoid. I explained this to the pt, and she said she understood and would go.

## 2017-04-21 NOTE — ED Provider Notes (Signed)
[facility] Provider Note   CSN: [id number] Arrival date & time: 04/21/17  1652     History   Chief Complaint Chief Complaint  Patient presents with  . Hemorrhoids    HPI Heidi [name] is a 26 y.o. female.  HPI The patient has been having trouble with rectal bleeding that started several months ago. patient was admitted to the hospital the end of May for rectal bleeding. She ultimately had follow-up colonoscopy with Dr. Benard Rink.  This confirmed internal hemorrhoids.  Patient contacted her gastroenterologist in this past week because she's been having increasing pain in her rectal area associated with her hemorrhoids. She states it's hard for her to a bowel movement because of the pain. Denies any fevers or chills. No vomiting or diarrhea. She sent a message to the gastroenterologist office today because of her severe rectal pain. They instructed her to come to the emergency room Past Medical History:  Diagnosis Date  . Abdominal pain 01/10/2016  . Anxiety   . Back pain   . Bloating 01/10/2016  . Depression   . GERD (gastroesophageal reflux disease)   . Hematuria 01/10/2016  . Herpes simplex virus (HSV) infection   . Hypertension   . Osteoarthritis   . Prediabetes   . Schizo-affective schizophrenia (HCC)   . Type 2 diabetes mellitus (HCC) 03/28/2016  . Urinary frequency 01/10/2016    Patient Active Problem List   Diagnosis Date Noted  . Morbid obesity (HCC) 04/15/2017  . Pelvic pain in female 04/15/2017  . GERD (gastroesophageal reflux disease) 03/13/2017  . Abnormal liver function tests 03/13/2017  . Abdominal pain, epigastric 03/13/2017  . Rectal bleed 01/28/2017  . Type 2 diabetes mellitus (HCC) 03/28/2016  . Paresthesia 01/18/2016  . Neuropathic pain 11/10/2015  . Essential hypertension, benign 07/21/2015  . Anxiety and depression 06/22/2015    Past Surgical History:  Procedure Laterality Date  . CESAREAN SECTION    . COLONOSCOPY WITH PROPOFOL N/A 03/17/2017    Procedure: COLONOSCOPY WITH PROPOFOL;  Surgeon: Corbin Ade, MD;  Location: [facility];  Service: Endoscopy;  Laterality: N/A;  9:30am  . DILATION AND CURETTAGE OF UTERUS    . ESOPHAGOGASTRODUODENOSCOPY (EGD) WITH PROPOFOL N/A 03/17/2017   Procedure: ESOPHAGOGASTRODUODENOSCOPY (EGD) WITH PROPOFOL;  Surgeon: Corbin Ade, MD;  Location: [facility];  Service: Endoscopy;  Laterality: N/A;  . ESSURE TUBAL LIGATION      OB History    Gravida Para Term Preterm AB Living   2 1     1 1    SAB TAB Ectopic Multiple Live Births   1       1       Home Medications    Prior to Admission medications   Medication Sig Start Date End Date Taking? Authorizing Provider  albuterol (PROVENTIL HFA;VENTOLIN HFA) 108 (90 Base) MCG/ACT inhaler Inhale 2 puffs into the lungs every 6 (six) hours as needed for wheezing or shortness of breath. 04/18/17   Dettinger, Elige Radon, MD  ALPRAZolam Prudy Feeler) 0.25 MG tablet Take 1 tablet (0.25 mg total) by mouth 2 (two) times daily as needed for anxiety. 01/20/17   Dettinger, Elige Radon, MD  amLODipine (NORVASC) 5 MG tablet Take 1 tablet (5 mg total) by mouth daily. Patient taking differently: Take 5 mg by mouth at bedtime.  12/23/16   Dettinger, Elige Radon, MD  azithromycin (ZITHROMAX) 250 MG tablet Take 2 the first day and then one each day after. 04/18/17   Dettinger, Elige Radon, MD  cholecalciferol (VITAMIN D) 1000 units tablet Take 1,000 Units by mouth at bedtime.     [provider]  cyclobenzaprine (FLEXERIL) 10 MG tablet TAKE 1 TABLET BY MOUTH THREE TIMES DAILY AS NEEDED FOR MUSCLE SPASM 03/28/17   Dettinger, Elige Radon, MD  dexlansoprazole (DEXILANT) 60 MG capsule Take 1 capsule (60 mg total) by mouth daily. 04/16/17   Tiffany Kocher, PA-C  glucose blood (ACCU-CHEK GUIDE) test strip Use to check BG once daily at varying times of day. 03/28/16   Henrene Pastor, PharmD  Lancets 30G MISC Dispense Accu-Chek guide lancets.  Check blood sugar at varying times once per  day. 04/01/16   Dettinger, Elige Radon, MD  lisinopril (PRINIVIL,ZESTRIL) 20 MG tablet TAKE ONE TABLET BY MOUTH ONCE DAILY 12/30/16   Dettinger, Elige Radon, MD  meloxicam (MOBIC) 7.5 MG tablet TAKE 2 TABLETS BY MOUTH ONCE DAILY 03/28/17   Dettinger, Elige Radon, MD  metFORMIN (GLUCOPHAGE) 500 MG tablet Take 1 tablet (500 mg total) by mouth at bedtime. 04/16/17   Dettinger, Elige Radon, MD  OLANZapine (ZYPREXA) 10 MG tablet Take 1 tablet (10 mg total) by mouth at bedtime. 10/23/16   Dettinger, Elige Radon, MD  sertraline (ZOLOFT) 100 MG tablet Take 2 tablets (200 mg total) by mouth at bedtime. 10/23/16   Dettinger, Elige Radon, MD  traZODone (DESYREL) 50 MG tablet Take 1 tablet (50 mg total) by mouth at bedtime as needed for sleep. 02/05/17   Dettinger, Elige Radon, MD  triamcinolone cream (KENALOG) 0.1 % Apply 1 application topically 2 (two) times daily as needed (for rash/irritated skin.).    [provider]    Family History Family History  Problem Relation Age of Onset  . Depression Mother   . Hypertension Mother   . Diabetes Mother   . Mental illness Father   . Hypertension Father   . Diabetes Father   . Cancer Maternal Aunt   . Diabetes Paternal Grandmother   . Hypertension Paternal Grandmother   . Arthritis Paternal Grandmother   . Stroke Paternal Grandfather   . Heart disease Paternal Grandfather   . Hypertension Paternal Grandfather   . Diabetes Paternal Grandfather   . Schizophrenia Maternal Grandfather   . Alcohol abuse Maternal Grandfather   . Other Sister        recovering addict  . Schizophrenia Sister   . Irritable bowel syndrome Sister   . Other Sister        recovering addict  . Bipolar disorder Sister   . Seizures Sister   . Colon cancer Neg Hx   . Inflammatory bowel disease Neg Hx   . Celiac disease Neg Hx     Social History Social History  Substance Use Topics  . Smoking status: Current Every Day Smoker    Packs/day: 1.00    Years: 10.00    Types: Cigarettes  .  Smokeless tobacco: Never Used  . Alcohol use 0.0 oz/week     Comment: Occasionally      Allergies   Hydroxyzine; Linzess [linaclotide]; Lorazepam; Morphine and related; and Quetiapine   Review of Systems Review of Systems  All other systems reviewed and are negative.    Physical Exam Updated Vital Signs BP (!) 121/91 (BP Location: Right Arm)   Pulse 95   Temp (!) 97.5 F (36.4 C) (Oral)   Resp 18   Ht 1.727 m (5\' 8" )   Wt (!) 144.7 kg (319 lb)   LMP 03/26/2017   SpO2 100%   BMI  48.50 kg/m   Physical Exam  Constitutional: She appears well-developed and well-nourished. No distress.  obese  HENT:  Head: Normocephalic and atraumatic.  Right Ear: External ear normal.  Left Ear: External ear normal.  Eyes: Conjunctivae are normal. Right eye exhibits no discharge. Left eye exhibits no discharge. No scleral icterus.  Neck: Neck supple. No tracheal deviation present.  Cardiovascular: Normal rate.   Pulmonary/Chest: Effort normal. No stridor. No respiratory distress.  Abdominal: She exhibits no distension.  Genitourinary: Rectal exam shows external hemorrhoid and tenderness.  Genitourinary Comments: Patient has external hemorrhoidal tissue.  There is no thrombosed external hemorrhoid. She is is very tender however internal rectal exam.  No obvious seizure, no surrounding erythema  Musculoskeletal: She exhibits no edema.  Neurological: She is alert. Cranial nerve deficit: no gross deficits.  Skin: Skin is warm and dry. No rash noted.  Psychiatric: She has a normal mood and affect.  Nursing note and vitals reviewed.    ED Treatments / Results  Labs (all labs ordered are listed, but only abnormal results are displayed) Labs Reviewed - No data to display  EKG  EKG Interpretation None       Radiology No results found.  Procedures Procedures (including critical care time)  Medications Ordered in ED Medications  lidocaine (XYLOCAINE) 2 % jelly (not administered)   lidocaine (XYLOCAINE) 2 % jelly 1 application (not administered)     Initial Impression / Assessment and Plan / ED Course  I have reviewed the triage vital signs and the nursing notes.  Pertinent labs & imaging results that were available during my care of the patient were reviewed by me and considered in my medical decision making (see chart for details).   I suspect the patient is having pain associated with internal hemorrhoids. No symptoms to suggest infection. I explained to patient but unfortunately I do not see any external from post hemorrhoid that I can lance to decrease her pain. I will apply some lidocaine viscous jelly to help with her discomfort.  I spoke with Dr. Lovell Sheehan who will be happy to see her in the office in follow-up. Final Clinical Impressions(s) / ED Diagnoses   Final diagnoses:  Rectal pain  Hemorrhoids, unspecified hemorrhoid type    New Prescriptions New Prescriptions   No medications on file     Linwood Dibbles, MD 04/21/17 2252

## 2017-04-21 NOTE — ED Triage Notes (Signed)
Patient complaining of rectal pain x 1 1/2 weeks. States she has history of hemorrhoids and is now unable to have a bowel movement.

## 2017-04-21 NOTE — Telephone Encounter (Signed)
PATIENT CALLED VERY UPSET STATING THAT SHE IS IN TERRIBLE PAIN AND CAN NOT USE THE BATHROOM WITH OUT LAXATIVES.  HAS HAD ISSUES SINCE HER TCS [phone]

## 2017-04-21 NOTE — Discharge Instructions (Signed)
Continue the stool softeners, drink plenty of fluids, do warm soaks and sits baths to help with the discomfort

## 2017-04-22 NOTE — Telephone Encounter (Signed)
ER records reviewed. Referred to [facility] for management of hemorrhoids but no evidence of thrombosed hemorrhoids. Sent patient mychart message.

## 2017-04-24 ENCOUNTER — Ambulatory Visit (INDEPENDENT_AMBULATORY_CARE_PROVIDER_SITE_OTHER): Payer: Medicaid Other | Admitting: Obstetrics and Gynecology

## 2017-04-24 ENCOUNTER — Encounter: Payer: Self-pay | Admitting: Family Medicine

## 2017-04-24 ENCOUNTER — Ambulatory Visit (INDEPENDENT_AMBULATORY_CARE_PROVIDER_SITE_OTHER): Payer: Medicaid Other | Admitting: Family Medicine

## 2017-04-24 ENCOUNTER — Encounter: Payer: Self-pay | Admitting: Obstetrics and Gynecology

## 2017-04-24 VITALS — BP 135/81 | HR 88 | Temp 97.2°F | Ht 68.0 in | Wt 315.0 lb

## 2017-04-24 VITALS — BP 140/70 | HR 76 | Wt 316.6 lb

## 2017-04-24 DIAGNOSIS — R102 Pelvic and perineal pain: Secondary | ICD-10-CM | POA: Diagnosis not present

## 2017-04-24 DIAGNOSIS — F325 Major depressive disorder, single episode, in full remission: Secondary | ICD-10-CM | POA: Insufficient documentation

## 2017-04-24 DIAGNOSIS — M1712 Unilateral primary osteoarthritis, left knee: Secondary | ICD-10-CM | POA: Diagnosis not present

## 2017-04-24 DIAGNOSIS — I10 Essential (primary) hypertension: Secondary | ICD-10-CM

## 2017-04-24 DIAGNOSIS — Z6841 Body Mass Index (BMI) 40.0 and over, adult: Secondary | ICD-10-CM | POA: Diagnosis not present

## 2017-04-24 DIAGNOSIS — M792 Neuralgia and neuritis, unspecified: Secondary | ICD-10-CM

## 2017-04-24 DIAGNOSIS — E119 Type 2 diabetes mellitus without complications: Secondary | ICD-10-CM | POA: Diagnosis not present

## 2017-04-24 DIAGNOSIS — M1711 Unilateral primary osteoarthritis, right knee: Secondary | ICD-10-CM

## 2017-04-24 LAB — BAYER DCA HB A1C WAIVED: HB A1C (BAYER DCA - WAIVED): 11.1 % — ABNORMAL HIGH (ref <7.0)

## 2017-04-24 MED ORDER — METHYLPREDNISOLONE ACETATE 80 MG/ML IJ SUSP
80.0000 mg | Freq: Once | INTRAMUSCULAR | Status: AC
  Administered 2017-04-24: 80 mg via INTRAMUSCULAR

## 2017-04-24 NOTE — Progress Notes (Addendum)
[facility] Visit  04/24/2017            Patient name: Heidi [name] MRN [id number]  Date of birth: Feb 12, 1991  CC & HPI:  Heidi [name] is a 26 y.o. female presenting today for f/u visit concerning Essure removal discussion on 04/09/17. Pt was asked to request records from [facility] where Essure was placed, and then return for f/u. 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  Pt has one child, delivered by C-section. Pt does not want any more biological children, but wants to adopt.  Pt was also seen in the ED for internal hemorrhoids and associated pain on 04/21/17.  ROS:  ROS  -fever -chills All systems are negative except as noted in the HPI and PMH.    Pertinent History Reviewed:   Reviewed: Significant for Essure sterilization Medical         Past Medical History:  Diagnosis Date  . Abdominal pain 01/10/2016  . Anxiety   . Back pain   . Bloating 01/10/2016  . Depression   . GERD (gastroesophageal reflux disease)   . Hematuria 01/10/2016  . Herpes simplex virus (HSV) infection   . Hypertension   . Osteoarthritis   . Prediabetes   . Schizo-affective schizophrenia (HCC)   . Type 2 diabetes mellitus (HCC) 03/28/2016  . Urinary frequency 01/10/2016                              Surgical Hx:    Past Surgical History:  Procedure Laterality Date  . CESAREAN SECTION    . COLONOSCOPY WITH PROPOFOL N/A 03/17/2017   Procedure: COLONOSCOPY WITH PROPOFOL;  Surgeon: Corbin Ade, MD;  Location[facility];  Service: Endoscopy;  Laterality: N/A;  9:30am  . DILATION AND CURETTAGE OF UTERUS    . ESOPHAGOGASTRODUODENOSCOPY (EGD) WITH PROPOFOL N/A 03/17/2017   Procedure: ESOPHAGOGASTRODUODENOSCOPY (EGD) WITH PROPOFOL;  Surgeon: Corbin Ade, MD;  Location: [facility];  Service: Endoscopy;  Laterality: N/A;  . ESSURE TUBAL LIGATION     Medications: Reviewed & Updated - see associated section                       Current  Outpatient Prescriptions:  .  albuterol (PROVENTIL HFA;VENTOLIN HFA) 108 (90 Base) MCG/ACT inhaler, Inhale 2 puffs into the lungs every 6 (six) hours as needed for wheezing or shortness of breath., Disp: 1 Inhaler, Rfl: 2 .  ALPRAZolam (XANAX) 0.25 MG tablet, Take 1 tablet (0.25 mg total) by mouth 2 (two) times daily as needed for anxiety., Disp: 10 tablet, Rfl: 2 .  amLODipine (NORVASC) 5 MG tablet, Take 1 tablet (5 mg total) by mouth daily. (Patient taking differently: Take 5 mg by mouth at bedtime. ), Disp: 90 tablet, Rfl: 1 .  azithromycin (ZITHROMAX) 250 MG tablet, Take 2 the first day and then one each day after., Disp: 6 tablet, Rfl: 0 .  cholecalciferol (VITAMIN D) 1000 units tablet, Take 1,000 Units by mouth at bedtime. , Disp: , Rfl:  .  cyclobenzaprine (FLEXERIL) 10 MG tablet, TAKE 1 TABLET BY MOUTH THREE TIMES DAILY AS NEEDED FOR MUSCLE SPASM, Disp: 30 tablet, Rfl: 2 .  dexlansoprazole (DEXILANT) 60 MG capsule, Take 1 capsule (60 mg total) by mouth daily., Disp: 30 capsule, Rfl: 11 .  glucose blood (ACCU-CHEK GUIDE) test strip, Use to check BG once  daily at varying times of day., Disp: 100 each, Rfl: 3 .  Lancets 30G MISC, Dispense Accu-Chek guide lancets.  Check blood sugar at varying times once per day., Disp: 100 each, Rfl: 11 .  lidocaine (XYLOCAINE) 2 % jelly, 1 application by Other route as needed. Apply to the rectal area once per day for hemorrhoid discomfort, Disp: 30 mL, Rfl: 0 .  lisinopril (PRINIVIL,ZESTRIL) 20 MG tablet, TAKE ONE TABLET BY MOUTH ONCE DAILY, Disp: 30 tablet, Rfl: 5 .  meloxicam (MOBIC) 7.5 MG tablet, TAKE 2 TABLETS BY MOUTH ONCE DAILY, Disp: 60 tablet, Rfl: 2 .  metFORMIN (GLUCOPHAGE) 500 MG tablet, Take 1 tablet (500 mg total) by mouth at bedtime., Disp: 30 tablet, Rfl: 0 .  OLANZapine (ZYPREXA) 10 MG tablet, Take 1 tablet (10 mg total) by mouth at bedtime., Disp: 90 tablet, Rfl: 1 .  sertraline (ZOLOFT) 100 MG tablet, Take 2 tablets (200 mg total) by mouth at  bedtime., Disp: 180 tablet, Rfl: 2 .  traZODone (DESYREL) 50 MG tablet, Take 1 tablet (50 mg total) by mouth at bedtime as needed for sleep., Disp: 30 tablet, Rfl: 2 .  triamcinolone cream (KENALOG) 0.1 %, Apply 1 application topically 2 (two) times daily as needed (for rash/irritated skin.)., Disp: , Rfl:    Social History: Reviewed -  reports that she has been smoking Cigarettes.  She has a 10.00 pack-year smoking history. She has never used smokeless tobacco.  Objective Findings:  Vitals: Blood pressure 140/70, pulse 76, weight (!) 316 lb 9.6 oz (143.6 kg), last menstrual period 03/27/2017. Body mass index is 48.14 kg/m.   Physical Examination: General appearance - alert, well appearing, and in no distress Mental status - alert, oriented to person, place, and time Pelvic - not indicated  Discussion: 1. Discussed with pt risks and benefits of hysterectomy to remove Essure. Different types of hysterectomies discussed. Pt wants to keep her ovaries, and as much of her uterus as possible. She states she, ' wants to keep her hormones.' Pt advised that if cervix is kept, she will continue to need Pap smears every 3-5 year, to which she agreed was fine. Pt has done her own research on procedures to remove Essure.  Discussed with pt weight loss methods including food measurement and calorie counting using apps such as MyNetDiary or My Fitness Pal. Additionally encouraged pt to become more active by taking daily walks of at least 30 minute duration, join a local gym such as the [facility] or attend water aerobics classes. Also advised pt to use pedometer on smartphone or utilize a smartband such as FitBit to keep track of daily activity. Diet supplements dicussed as well.   At end of discussion, pt had opportunity to ask questions and has no further questions at this time.   Specific discussion as noted above. Greater than 50% was spent in counseling and coordination of care with the patient.   Total  time greater than: 25 minutes.     Assessment & Plan:   A:  1. Pelvic pain, associated with Essure IUD.  P:  1. Schedule pre-op appointment for 1 month 2. Schedule supracervical hysterectomy 3. Pt will call back after doing her own research about weight loss pill to request prescription for Phentermine  pt has come by 8/28, given 30 days Phentermine with refil x 1.   Note: Pictures of uterus will be taken of surgery for records  By signing my name below, I, Izna Ahmed, attest that this documentation has  been prepared under the direction and in the presence of Tilda Burrow, MD. Electronically Signed: Redge Gainer, Medical Scribe. 04/24/17. 11:00 AM.  I personally performed the services described in this documentation, which was SCRIBED in my presence. The recorded information has been reviewed and considered accurate. It has been edited as necessary during review. Tilda Burrow, MD

## 2017-04-24 NOTE — Progress Notes (Signed)
BP 135/81   Pulse 88   Temp (!) 97.2 F (36.2 C) (Oral)   Ht 5' 8" (1.727 m)   Wt (!) 315 lb (142.9 kg)   LMP 03/27/2017   BMI 47.90 kg/m    Subjective:    Patient ID: Heidi [name], female    DOB: 1991-08-06, 26 y.o.   MRN: [id number]  HPI: Heidi [name] is a 26 y.o. female presenting on 04/24/2017 for Diabetes (4 mo ); Hypertension; and Knee Pain   HPI Type 2 diabetes mellitus Patient comes in today for recheck of his diabetes. Patient has been currently taking Metformin. Patient is currently on an ACE inhibitor/ARB. Patient has not seen an ophthalmologist this year. Patient denies any issues with their feet. Patient is morbidly obese and is put on phentermine from her OB/GYN.  Hypertension Patient is currently on lisinopril 20 and amlodipine, and their blood pressure today is 135/81. Patient denies any lightheadedness or dizziness. Patient denies headaches, blurred vision, chest pains, shortness of breath, or weakness. Denies any side effects from medication and is content with current medication.   Major depression and anxiety Patient is coming in for recheck of anxiety depression. She currently uses alprazolam as needed and Zoloft and trazodone. She denies any suicidal ideations or thoughts or so. She says she is doing very well on her medications and denies any major feelings of depression or major panic attacks.  Depression screen [facility] 2/9 04/24/2017 04/18/2017 03/25/2017 03/20/2017 01/31/2017  Decreased Interest 2 1 0 1 1  Down, Depressed, Hopeless _0 PHQ - 2 Score _1 Altered sleeping 1 2 - 2 2  Tired, decreased energy 3 2 - 2 2  Change in appetite 2 1 - 2 2  Feeling bad or failure about yourself  1 0 - 1 1  Trouble concentrating 0 2 - 1 1  Moving slowly or fidgety/restless 0 0 - 0 0  Suicidal thoughts 0 0 - 0 -  PHQ-9 Score 10 9 - 10 10  Difficult doing work/chores Somewhat difficult - - - Very difficult  Some recent data might be hidden     Bilateral  knee arthritis Patient has been having trouble with bilateral knee arthritis, likely rheumatological in nature and saw a rheumatologist who recommended getting injections as needed. It has been 4 months since she had injections and she wants to get them again today. She says they do work well and last her at least 3-1/2 months to sometimes 4 months.  Relevant past medical, surgical, family and social history reviewed and updated as indicated. Interim medical history since our last visit reviewed. Allergies and medications reviewed and updated.  Review of Systems  Constitutional: Negative for chills and fever.  HENT: Negative for congestion, ear discharge and ear pain.   Eyes: Negative for visual disturbance.  Respiratory: Negative for chest tightness and shortness of breath.   Cardiovascular: Negative for chest pain and leg swelling.  Genitourinary: Negative for difficulty urinating and dysuria.  Musculoskeletal: Positive for arthralgias. Negative for back pain and gait problem.  Skin: Negative for rash.  Neurological: Negative for light-headedness and headaches.  Psychiatric/Behavioral: Positive for dysphoric mood. Negative for agitation, behavioral problems, self-injury, sleep disturbance and suicidal ideas. The patient is nervous/anxious.   All other systems reviewed and are negative.   Per HPI unless specifically indicated above     Objective:    BP 135/81   Pulse 88   Temp (!) 97.2  F (36.2 C) (Oral)   Ht 5' 8" (1.727 m)   Wt (!) 315 lb (142.9 kg)   LMP 03/27/2017   BMI 47.90 kg/m   Wt Readings from Last 3 Encounters:  04/24/17 (!) 315 lb (142.9 kg)  04/24/17 (!) 316 lb 9.6 oz (143.6 kg)  04/21/17 (!) 319 lb (144.7 kg)    Physical Exam  Constitutional: She is oriented to person, place, and time. She appears well-developed and well-nourished. No distress.  Eyes: Conjunctivae are normal.  Neck: Neck supple. No thyromegaly present.  Cardiovascular: Normal rate, regular  rhythm, normal heart sounds and intact distal pulses.   No murmur heard. Pulmonary/Chest: Effort normal and breath sounds normal. No respiratory distress. She has no wheezes. She has no rales.  Musculoskeletal: Normal range of motion. She exhibits tenderness. She exhibits no edema.  Lymphadenopathy:    She has no cervical adenopathy.  Neurological: She is alert and oriented to person, place, and time. Coordination normal.  Skin: Skin is warm and dry. No rash noted. She is not diaphoretic.  Psychiatric: Her behavior is normal. Judgment normal. Her mood appears anxious. She exhibits a depressed mood. She expresses no suicidal ideation. She expresses no suicidal plans.  Nursing note and vitals reviewed.   Bilateral Knee injections: Consent form signed. Risk factors of bleeding and infection discussed with patient and patient is agreeable towards injection. Patient prepped with Betadine. Lateral approach towards injection used. Injected 80 mg of Depo-Medrol and 1 mL of 2% lidocaine. Patient tolerated procedure well and no side effects from noted. Minimal to no bleeding. Simple bandage applied after.     Assessment & Plan:   Problem List Items Addressed This Visit      Cardiovascular and Mediastinum   Essential hypertension, benign   Relevant Orders   CMP14+EGFR (Completed)     Endocrine   Type 2 diabetes mellitus (HCC) - Primary   Relevant Medications   Empagliflozin-Metformin HCl ER (SYNJARDY XR) 12-998 MG TB24   Other Relevant Orders   Bayer DCA Hb A1c Waived (Completed)   CMP14+EGFR (Completed)     Other   Neuropathic pain   Morbid obesity (HCC)   Relevant Medications   Empagliflozin-Metformin HCl ER (SYNJARDY XR) 12-998 MG TB24   Other Relevant Orders   Lipid panel (Completed)   Depression, major, in remission (HCC)    Other Visit Diagnoses    Osteoarthritis of right knee, unspecified osteoarthritis type       Relevant Medications   methylPREDNISolone acetate (DEPO-MEDROL)  injection 80 mg (Completed)   methylPREDNISolone acetate (DEPO-MEDROL) injection 80 mg (Completed)   Osteoarthritis of left knee, unspecified osteoarthritis type       Relevant Medications   methylPREDNISolone acetate (DEPO-MEDROL) injection 80 mg (Completed)   methylPREDNISolone acetate (DEPO-MEDROL) injection 80 mg (Completed)       Follow up plan: Return in about 4 months (around 08/24/2017), or if symptoms worsen or fail to improve, for Type 2 diabetes and hypertension recheck.  Counseling provided for all of the vaccine components Orders Placed This Encounter  Procedures  . Bayer DCA Hb A1c Waived  . CMP14+EGFR  . Lipid panel     , MD [facility] 04/24/2017, 1:22 PM     

## 2017-04-24 NOTE — Patient Instructions (Signed)

## 2017-04-25 LAB — CMP14+EGFR
ALBUMIN: 4.2 g/dL (ref 3.5–5.5)
ALK PHOS: 155 IU/L — AB (ref 39–117)
ALT: 33 IU/L — ABNORMAL HIGH (ref 0–32)
AST: 38 IU/L (ref 0–40)
Albumin/Globulin Ratio: 1.9 (ref 1.2–2.2)
BUN / CREAT RATIO: 8 — AB (ref 9–23)
BUN: 6 mg/dL (ref 6–20)
Bilirubin Total: 0.3 mg/dL (ref 0.0–1.2)
CO2: 22 mmol/L (ref 20–29)
CREATININE: 0.73 mg/dL (ref 0.57–1.00)
Calcium: 8.9 mg/dL (ref 8.7–10.2)
Chloride: 98 mmol/L (ref 96–106)
GFR calc non Af Amer: 113 mL/min/{1.73_m2} (ref >59)
GFR, EST AFRICAN AMERICAN: 131 mL/min/{1.73_m2} (ref >59)
GLOBULIN, TOTAL: 2.2 g/dL (ref 1.5–4.5)
Glucose: 306 mg/dL — ABNORMAL HIGH (ref 65–99)
Potassium: 4.3 mmol/L (ref 3.5–5.2)
SODIUM: 136 mmol/L (ref 134–144)
TOTAL PROTEIN: 6.4 g/dL (ref 6.0–8.5)

## 2017-04-25 LAB — LIPID PANEL
CHOL/HDL RATIO: 4.7 ratio — AB (ref 0.0–4.4)
CHOLESTEROL TOTAL: 126 mg/dL (ref 100–199)
HDL: 27 mg/dL — ABNORMAL LOW (ref >39)
LDL CALC: 54 mg/dL (ref 0–99)
Triglycerides: 226 mg/dL — ABNORMAL HIGH (ref 0–149)
VLDL CHOLESTEROL CAL: 45 mg/dL — AB (ref 5–40)

## 2017-04-28 ENCOUNTER — Telehealth: Payer: Self-pay

## 2017-04-28 MED ORDER — EMPAGLIFLOZIN-METFORMIN HCL ER 5-1000 MG PO TB24: 1.0000 | ORAL_TABLET | Freq: Two times a day (BID) | ORAL | 2 refills | Status: DC

## 2017-04-28 NOTE — Telephone Encounter (Signed)
Medicaid non preferred Synjardy XR  Preferred are [place] and [employer]

## 2017-04-28 NOTE — Telephone Encounter (Signed)
Go ahead and send her Jardiance 10mg  daily and metformin 1000 twice a day

## 2017-04-29 ENCOUNTER — Encounter: Payer: Self-pay | Admitting: General Surgery

## 2017-04-29 ENCOUNTER — Telehealth: Payer: Self-pay | Admitting: *Deleted

## 2017-04-29 ENCOUNTER — Ambulatory Visit (INDEPENDENT_AMBULATORY_CARE_PROVIDER_SITE_OTHER): Payer: Medicaid Other | Admitting: General Surgery

## 2017-04-29 VITALS — BP 145/86 | HR 85 | Temp 95.9°F | Resp 18 | Ht 68.0 in | Wt 313.0 lb

## 2017-04-29 DIAGNOSIS — K602 Anal fissure, unspecified: Secondary | ICD-10-CM

## 2017-04-29 MED ORDER — EMPAGLIFLOZIN 10 MG PO TABS: 10.0000 mg | ORAL_TABLET | Freq: Every day | ORAL | 3 refills | Status: DC

## 2017-04-29 MED ORDER — METFORMIN HCL 1000 MG PO TABS: 1000.0000 mg | ORAL_TABLET | Freq: Two times a day (BID) | ORAL | 3 refills | Status: DC

## 2017-04-29 NOTE — Addendum Note (Signed)
Addended by: Magdalene River on: 04/29/2017 10:14 AM   Modules accepted: Orders

## 2017-04-29 NOTE — Telephone Encounter (Signed)
Done

## 2017-04-29 NOTE — Progress Notes (Signed)
Heidi [name]; [id number]; September 13, 1990   HPI  Heidi [name] is a 26 yo female with a history of rectal bleeding who has undergone colonoscopy/ EGD with Dr. Jena Gauss and reports having found internal hemorrhoids which per report that day remained prolapsed.  After her colonoscopy she had bleeding for multiple days, and now is experiencing significant pain for the last week. She says she has been applying lidocaine internally and feels swelling, but is having sharp pain in her rectal area. She also reports that her stools vary from hard to soft and that she has had some constipation.  She denies itching or any mucus.  At this point the lidocaine ointment is not lessening her discomfort.   Past Medical History:  Diagnosis Date  . Abdominal pain 01/10/2016  . Anxiety   . Back pain   . Bloating 01/10/2016  . Depression   . GERD (gastroesophageal reflux disease)   . Hematuria 01/10/2016  . Herpes simplex virus (HSV) infection   . Hypertension   . Osteoarthritis   . Prediabetes   . Schizo-affective schizophrenia (HCC)   . Type 2 diabetes mellitus (HCC) 03/28/2016  . Urinary frequency 01/10/2016    Past Surgical History:  Procedure Laterality Date  . CESAREAN SECTION    . COLONOSCOPY WITH PROPOFOL N/A 03/17/2017   Procedure: COLONOSCOPY WITH PROPOFOL;  Surgeon: Corbin Ade, MD;  Location: [facility];  Service: Endoscopy;  Laterality: N/A;  9:30am  . DILATION AND CURETTAGE OF UTERUS    . ESOPHAGOGASTRODUODENOSCOPY (EGD) WITH PROPOFOL N/A 03/17/2017   Procedure: ESOPHAGOGASTRODUODENOSCOPY (EGD) WITH PROPOFOL;  Surgeon: Corbin Ade, MD;  Location: [facility];  Service: Endoscopy;  Laterality: N/A;  . ESSURE TUBAL LIGATION      Family History  Problem Relation Age of Onset  . Depression Mother   . Hypertension Mother   . Diabetes Mother   . Mental illness Father   . Hypertension Father   . Diabetes Father   . Cancer Maternal Aunt   . Diabetes Paternal Grandmother   . Hypertension  Paternal Grandmother   . Arthritis Paternal Grandmother   . Stroke Paternal Grandfather   . Heart disease Paternal Grandfather   . Hypertension Paternal Grandfather   . Diabetes Paternal Grandfather   . Schizophrenia Maternal Grandfather   . Alcohol abuse Maternal Grandfather   . Other Sister        recovering addict  . Schizophrenia Sister   . Irritable bowel syndrome Sister   . Other Sister        recovering addict  . Bipolar disorder Sister   . Seizures Sister   . Colon cancer Neg Hx   . Inflammatory bowel disease Neg Hx   . Celiac disease Neg Hx     Current Outpatient Prescriptions on File Prior to Visit  Medication Sig Dispense Refill  . albuterol (PROVENTIL HFA;VENTOLIN HFA) 108 (90 Base) MCG/ACT inhaler Inhale 2 puffs into the lungs every 6 (six) hours as needed for wheezing or shortness of breath. 1 Inhaler 2  . ALPRAZolam (XANAX) 0.25 MG tablet Take 1 tablet (0.25 mg total) by mouth 2 (two) times daily as needed for anxiety. 10 tablet 2  . amLODipine (NORVASC) 5 MG tablet Take 1 tablet (5 mg total) by mouth daily. (Patient taking differently: Take 5 mg by mouth at bedtime. ) 90 tablet 1  . cholecalciferol (VITAMIN D) 1000 units tablet Take 1,000 Units by mouth at bedtime.     . cyclobenzaprine (FLEXERIL) 10 MG tablet  TAKE 1 TABLET BY MOUTH THREE TIMES DAILY AS NEEDED FOR MUSCLE SPASM 30 tablet 2  . dexlansoprazole (DEXILANT) 60 MG capsule Take 1 capsule (60 mg total) by mouth daily. 30 capsule 11  . empagliflozin (JARDIANCE) 10 MG TABS tablet Take 10 mg by mouth daily. 30 tablet 3  . Empagliflozin-Metformin HCl ER (SYNJARDY XR) 12-998 MG TB24 Take 1 tablet by mouth 2 (two) times daily. 60 tablet 2  . glucose blood (ACCU-CHEK GUIDE) test strip Use to check BG once daily at varying times of day. 100 each 3  . Lancets 30G MISC Dispense Accu-Chek guide lancets.  Check blood sugar at varying times once per day. 100 each 11  . lidocaine (XYLOCAINE) 2 % jelly 1 application by Other  route as needed. Apply to the rectal area once per day for hemorrhoid discomfort 30 mL 0  . lisinopril (PRINIVIL,ZESTRIL) 20 MG tablet TAKE ONE TABLET BY MOUTH ONCE DAILY 30 tablet 5  . meloxicam (MOBIC) 7.5 MG tablet TAKE 2 TABLETS BY MOUTH ONCE DAILY 60 tablet 2  . metFORMIN (GLUCOPHAGE) 1000 MG tablet Take 1 tablet (1,000 mg total) by mouth 2 (two) times daily with a meal. 60 tablet 3  . OLANZapine (ZYPREXA) 10 MG tablet Take 1 tablet (10 mg total) by mouth at bedtime. 90 tablet 1  . sertraline (ZOLOFT) 100 MG tablet Take 2 tablets (200 mg total) by mouth at bedtime. 180 tablet 2  . traZODone (DESYREL) 50 MG tablet Take 1 tablet (50 mg total) by mouth at bedtime as needed for sleep. 30 tablet 2  . triamcinolone cream (KENALOG) 0.1 % Apply 1 application topically 2 (two) times daily as needed (for rash/irritated skin.).     No current facility-administered medications on file prior to visit.     Allergies  Allergen Reactions  . Hydroxyzine     Causes her more anxiety.  [name] [Linaclotide] Hives  . Lorazepam     Sedation  . Morphine And Related Other (See Comments)    Makes pt.aggressive  . Quetiapine     Sweating, irritability    History  Alcohol Use  . 0.0 oz/week    Comment: Occasionally     History  Smoking Status  . Current Every Day Smoker  . Packs/day: 1.00  . Years: 10.00  . Types: Cigarettes  Smokeless Tobacco  . Never Used    Review of Systems  Gastrointestinal: Positive for abdominal pain, blood in stool, heartburn, nausea and vomiting.  All other systems reviewed and are negative. Varying BMs, constipated at times   Objective   Vitals:   04/29/17 1304  BP: (!) 145/86  Pulse: 85  Resp: 18  Temp: (!) 95.9 F (35.5 C)    Physical Exam  Constitutional: She is oriented to person, place, and time and well-developed, well-nourished, and in no distress.  HENT:  Head: Normocephalic.  Eyes: Pupils are equal, round, and reactive to light.   Cardiovascular: Normal rate and regular rhythm.   Pulmonary/Chest: Effort normal and breath sounds normal.  Abdominal: Soft. There is no tenderness. There is no rebound and no guarding.  Genitourinary: Rectal exam shows external hemorrhoid.  Genitourinary Comments: Minimal external hemorrhoids, posterior sentinel pile, no tenderness around the anal opening, but on attempt of rectal exam, sphincter tight and unable to complete, no gross blood  Musculoskeletal: Normal range of motion.  Neurological: She is alert and oriented to person, place, and time. GCS score is 15.    Assessment  Ms. Modeste is  a 26 year old female with history of hemorrhoids confirmed on colonoscopy with Dr. Jena Gauss who comes in with significant rectal pain for th past week and evidence of fissure in addition to the hemorrhoids.  Hemorrhoids externally were minimal and sentinel pile posteriorly more consistent with fissure.    Plan   Given pain discussed options of diltiazem/lidocaine cream to help heal fissure, called into [facility] Keeping stools soft, over counter colace Return in 1 week May ultimate need EUA to determine full extent of internal hemorrhoids and fissure if not improving

## 2017-04-29 NOTE — Telephone Encounter (Signed)
Handwritten RX from Dr. Emelda Fear for Phentermine 37.5 mg #30 given to pt.  04-29-17  AS

## 2017-04-29 NOTE — Patient Instructions (Signed)
Anal Fissure, Adult An anal fissure is a small tear or crack in the skin around the anus. Bleeding from a fissure usually stops on its own within a few minutes. However, bleeding will often occur again with each bowel movement until the crack heals. What are the causes? This condition may be caused by:  Passing large, hard stool (feces).  Frequent diarrhea.  Constipation.  Inflammatory bowel disease (Crohn disease or ulcerative colitis).  Infections.  Anal sex.  What are the signs or symptoms? Symptoms of this condition include:  Bleeding from the rectum.  Small amounts of blood seen on your stool, on toilet paper, or in the toilet after a bowel movement.  Painful bowel movements.  Itching or irritation around the anus.  How is this diagnosed? A health care provider may diagnose this condition by closely examining the anal area. An anal fissure can usually be seen with careful inspection. In some cases, a rectal exam may be performed, or a short tube (anoscope) may be used to examine the anal canal. How is this treated? Treatment for this condition may include:  Taking steps to avoid constipation. This may include making changes to your diet, such as increasing your intake of fiber or fluid.  Taking fiber supplements. These supplements can soften your stool to help make bowel movements easier. Your health care provider may also prescribe a stool softener if your stool is often hard.  Taking sitz baths. This may help to heal the tear.  Using medicated creams or ointments. These may be prescribed to lessen discomfort.  Follow these instructions at home: Eating and drinking  Avoid foods that may be constipating, such as bananas and dairy products.  Drink enough fluid to keep your urine clear or pale yellow.  Maintain a diet that is high in fruits, whole grains, and vegetables. General instructions  Keep the anal area as clean and dry as possible.  Take sitz baths as  told by your health care provider. Do not use soap in the sitz baths.  Take over-the-counter and prescription medicines only as told by your health care provider.  Use creams or ointments only as told by your health care provider.  Keep all follow-up visits as told by your health care provider. This is important. Contact a health care provider if:  You have more bleeding.  You have a fever.  You have diarrhea that is mixed with blood.  You continue to have pain.  Your problem is getting worse rather than better. This information is not intended to replace advice given to you by your health care provider. Make sure you discuss any questions you have with your health care provider. Document Released: 08/19/2005 Document Revised: 12/27/2015 Document Reviewed: 11/14/2014 Elsevier Interactive Patient Education  2018 Elsevier Inc.  

## 2017-05-01 ENCOUNTER — Encounter: Payer: Self-pay | Admitting: Obstetrics and Gynecology

## 2017-05-06 ENCOUNTER — Ambulatory Visit (INDEPENDENT_AMBULATORY_CARE_PROVIDER_SITE_OTHER): Payer: Medicaid Other | Admitting: General Surgery

## 2017-05-06 ENCOUNTER — Encounter: Payer: Self-pay | Admitting: General Surgery

## 2017-05-06 VITALS — BP 159/83 | HR 89 | Temp 95.9°F | Resp 18 | Ht 68.0 in | Wt 309.0 lb

## 2017-05-06 DIAGNOSIS — K602 Anal fissure, unspecified: Secondary | ICD-10-CM | POA: Insufficient documentation

## 2017-05-06 DIAGNOSIS — Z029 Encounter for administrative examinations, unspecified: Secondary | ICD-10-CM

## 2017-05-06 NOTE — Patient Instructions (Signed)
Continue diltiazem/ lidocaine compound prescribed for fissure.

## 2017-05-06 NOTE — Progress Notes (Signed)
[facility] Follow-up Note   HPI:  26 y.o. Female presents to clinic for follow-up evaluation of an anal fissure. She was seen last week in Dr. York RamJenkin's clinic with complaints of anal pain.  She has a history of known hemorrhoids and had undergone a colonoscopy revealing internal hemorrhoids and some concern for prolapse.  The patient on evaluation in our clinic reported anal pain that was worse with defecation, minimal bleeding.  She was given a prescription of diltiazem/ lidocaine compound for the fissure. She has been using the compound, and feels much better. She reports less anal pain and no bleeding.  Review of Systems:  Improved anal pain and no bleeding No fevers or chills  All other review of systems: otherwise negative   Vital Signs:  BP (!) 159/83   Pulse 89   Temp (!) 95.9 F (35.5 C)   Resp 18   Ht 5\' 8"  (1.727 m)   Wt (!) 309 lb (140.2 kg)   BMI 46.98 kg/m    Physical Exam:  Constitutional:  Awake, alert, and oriented x3  Eyes:  Pupils equally round and reactive to light  No scleral icterus  Ear, nose, throat:  No nasal drainage, bleeding Pulmonary:  No crackles Equal breath sounds bilaterally Breathing non-labored at rest Cardiovascular:  Normal rate and rhythm  Gastrointestinal:  Soft, nontender, nondistended, no guarding/rebound  Rectal:  Deferred  Extremities:  Hands and feet warm, no edema    Laboratory studies: None  Imaging:  None   Assessment:  26 y.o. yo Female with a problem list including...  Patient Active Problem List   Diagnosis Date Noted  . Anal fissure 05/06/2017  . 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 Morbid obesity (HCC) 04/15/2017  . Pelvic pain in female 04/15/2017  . GERD (gastroesophageal reflux disease) 03/13/2017  . Abnormal liver function tests 03/13/2017  . Abdominal pain, epigastric 03/13/2017  . Rectal bleed 01/28/2017  . Type 2 diabetes mellitus (HCC) 03/28/2016  .  Paresthesia 01/18/2016  . Neuropathic pain 11/10/2015  . Essential hypertension, benign 07/21/2015  . Anxiety and depression 06/22/2015    Ms. Ceasar LundCrone presents to clinic for follow-up evaluation anal fissure, and is doing well with improvement in her pain and reports no bleeding.  She is overall doing better and we discussed that hemorrhoid and fissure surgery are both painful surgeries.   Plan:   - Follow up 9/27, after 1 month prescription for fissure is complete   - Will discuss if fissure is healed and decide if hemorrhoid surgery is appropriate   All of the above recommendations were discussed with the patient, and all of patient's questions were answered to her expressed satisfaction.  Algis GreenhouseLindsay Bridges, MD  05/06/17

## 2017-05-07 ENCOUNTER — Encounter: Payer: Self-pay | Admitting: *Deleted

## 2017-05-09 ENCOUNTER — Encounter: Payer: Self-pay | Admitting: Gastroenterology

## 2017-05-09 ENCOUNTER — Ambulatory Visit (INDEPENDENT_AMBULATORY_CARE_PROVIDER_SITE_OTHER): Payer: Medicaid Other | Admitting: Gastroenterology

## 2017-05-09 VITALS — BP 148/83 | HR 83 | Temp 97.4°F | Ht 68.0 in | Wt 306.8 lb

## 2017-05-09 DIAGNOSIS — R7989 Other specified abnormal findings of blood chemistry: Secondary | ICD-10-CM

## 2017-05-09 DIAGNOSIS — R945 Abnormal results of liver function studies: Secondary | ICD-10-CM

## 2017-05-09 DIAGNOSIS — K2100 Gastro-esophageal reflux disease with esophagitis, without bleeding: Secondary | ICD-10-CM

## 2017-05-09 NOTE — Assessment & Plan Note (Addendum)
Doing well on Dexilant. Return to the office in one year or sooner if needed.  Continue to follow with surgery regarding hemorrhoid disease and anorectal fissure.

## 2017-05-09 NOTE — Progress Notes (Signed)
Primary Care Physician: Dettinger, Elige RadonJoshua A, MD  Primary Gastroenterologist:  Roetta SessionsMichael Rourk, MD   Chief Complaint  Patient presents with  . Rectal Bleeding    HPI: Heidi [name] is a 26 y.o. female hereFor follow-up. She underwent an EGD and colonoscopy back in July for history of hematochezia and epigastric pain. She had LA grade B esophagitis on Nexium. She was switched pantoprazole but had poorly controlled reflux. She ultimately ended up on Dexilant and has been doing very well since that time. Colonoscopy she had nonthrombosed external hemorrhoids and internal hemorrhoids, grade 4. She's had significant rectal pain and ultimately has seen surgery, Dr. Larae GroomsLindsey Bridges for anorectal fissure. Currently on diltiazem/lidocaine compound. Plans for possible hemorrhoidectomy in the future. She has follow-up in one month.  Denies abdominal pain. Very little rectal bleeding at this point. Bowel movements are good some days but also get others. She uses over-the-counter laxatives as needed. Previously tried Linzess and states she can get away from the bathroom on that. She is not interested in trying any other dosages or any other prescription medications for constipation.  Louis R back in July we noted that she had mild elevation of ALT and alkaline phosphatase. We checked her labs, negative for hepatitis C, she is immune to hepatitis B. Liver appear normal on CT back in March. Plan to recheck further next month. Discussed possible fatty liver versus medication effects.  Current Outpatient Prescriptions  Medication Sig Dispense Refill  . albuterol (PROVENTIL HFA;VENTOLIN HFA) 108 (90 Base) MCG/ACT inhaler Inhale 2 puffs into the lungs every 6 (six) hours as needed for wheezing or shortness of breath. 1 Inhaler 2  . ALPRAZolam (XANAX) 0.25 MG tablet Take 1 tablet (0.25 mg total) by mouth 2 (two) times daily as needed for anxiety. 10 tablet 2  . amLODipine (NORVASC) 5 MG tablet Take 1 tablet  (5 mg total) by mouth daily. (Patient taking differently: Take 5 mg by mouth at bedtime. ) 90 tablet 1  . cholecalciferol (VITAMIN D) 1000 units tablet Take 1,000 Units by mouth at bedtime.     . cyclobenzaprine (FLEXERIL) 10 MG tablet TAKE 1 TABLET BY MOUTH THREE TIMES DAILY AS NEEDED FOR MUSCLE SPASM 30 tablet 2  . dexlansoprazole (DEXILANT) 60 MG capsule Take 1 capsule (60 mg total) by mouth daily. 30 capsule 11  . empagliflozin (JARDIANCE) 10 MG TABS tablet Take 10 mg by mouth daily. 30 tablet 3  . Empagliflozin-Metformin HCl ER (SYNJARDY XR) 12-998 MG TB24 Take 1 tablet by mouth 2 (two) times daily. 60 tablet 2  . glucose blood (ACCU-CHEK GUIDE) test strip Use to check BG once daily at varying times of day. 100 each 3  . Lancets 30G MISC Dispense Accu-Chek guide lancets.  Check blood sugar at varying times once per day. 100 each 11  . lidocaine (XYLOCAINE) 2 % jelly 1 application by Other route as needed. Apply to the rectal area once per day for hemorrhoid discomfort 30 mL 0  . lisinopril (PRINIVIL,ZESTRIL) 20 MG tablet TAKE ONE TABLET BY MOUTH ONCE DAILY 30 tablet 5  . meloxicam (MOBIC) 7.5 MG tablet TAKE 2 TABLETS BY MOUTH ONCE DAILY 60 tablet 2  . metFORMIN (GLUCOPHAGE) 1000 MG tablet Take 1 tablet (1,000 mg total) by mouth 2 (two) times daily with a meal. 60 tablet 3  . NONFORMULARY OR COMPOUNDED ITEM Place 1 application around the anus 4 (four) times daily.    Marland Kitchen. OLANZapine (ZYPREXA) 10 MG tablet Take 1  tablet (10 mg total) by mouth at bedtime. 90 tablet 1  . sertraline (ZOLOFT) 100 MG tablet Take 2 tablets (200 mg total) by mouth at bedtime. 180 tablet 2  . traZODone (DESYREL) 50 MG tablet Take 1 tablet (50 mg total) by mouth at bedtime as needed for sleep. 30 tablet 2  . triamcinolone cream (KENALOG) 0.1 % Apply 1 application topically 2 (two) times daily as needed (for rash/irritated skin.).     No current facility-administered medications for this visit.     Allergies as of  05/09/2017 - Review Complete 05/09/2017  Allergen Reaction Noted  . Hydroxyzine  10/23/2016  . Linzess [linaclotide] Hives 03/13/2017  . Lorazepam  11/29/2015  . Morphine and related Other (See Comments) 08/31/2014  . Quetiapine  11/29/2015    ROS:  General: Negative for anorexia, weight loss, fever, chills, fatigue, weakness. ENT: Negative for hoarseness, difficulty swallowing , nasal congestion. CV: Negative for chest pain, angina, palpitations, dyspnea on exertion, peripheral edema.  Respiratory: Negative for dyspnea at rest, dyspnea on exertion, cough, sputum, wheezing.  GI: See history of present illness. GU:  Negative for dysuria, hematuria, urinary incontinence, urinary frequency, nocturnal urination.  Endo: Negative for unusual weight change.    Physical Examination:   BP (!) 148/83   Pulse 83   Temp (!) 97.4 F (36.3 C) (Oral)   Ht  (1.727 m)   Wt (!) 306 lb 12.8 oz (139.2 kg)   BMI 46.65 kg/m   General: Well-nourished, well-developed in no acute distress.  Eyes: No icterus. Mouth: Oropharyngeal mucosa moist and pink , no lesions erythema or exudate. Lungs: Clear to auscultation bilaterally.  Heart: Regular rate and rhythm, no murmurs rubs or gallops.  Abdomen: Bowel sounds are normal, nontender, nondistended, no hepatosplenomegaly or masses, no abdominal bruits or hernia , no rebound or guarding.   Extremities: No lower extremity edema. No clubbing or deformities. Neuro: Alert and oriented x 4   Skin: Warm and dry, no jaundice.   Psych: Alert and cooperative, normal mood and affect.  Labs:  Lab Results  Component Value Date   CREATININE 0.73 04/24/2017   BUN 6 04/24/2017   NA 136 04/24/2017   K 4.3 04/24/2017   CL 98 04/24/2017   CO2 22 04/24/2017   Lab Results  Component Value Date   ALT 33 (H) 04/24/2017   AST 38 04/24/2017   ALKPHOS 155 (H) 04/24/2017   BILITOT 0.3 04/24/2017   Lab Results  Component Value Date   WBC 10.5 03/14/2017   HGB  12.1 03/14/2017   HCT 35.9 (L) 03/14/2017   MCV 87.3 03/14/2017   PLT 219 03/14/2017   No results found for: IRON, TIBC, FERRITIN  Imaging Studies: No results found.

## 2017-05-09 NOTE — Assessment & Plan Note (Signed)
Due for repeat labs next month.

## 2017-05-09 NOTE — Patient Instructions (Signed)
1. Return to the office in one year for follow up or call sooner if needed.  2. Continue Dexilant once daily.  3. If you need further assistance with your bowel issues, please let me know.  4. Continue follow up with surgery regarding your hemorrhoids.

## 2017-05-12 NOTE — Progress Notes (Signed)
cc'ed to pcp °

## 2017-05-23 ENCOUNTER — Encounter: Payer: Self-pay | Admitting: *Deleted

## 2017-05-23 ENCOUNTER — Other Ambulatory Visit: Payer: Self-pay | Admitting: Family Medicine

## 2017-05-23 DIAGNOSIS — F329 Major depressive disorder, single episode, unspecified: Secondary | ICD-10-CM

## 2017-05-23 DIAGNOSIS — F419 Anxiety disorder, unspecified: Principal | ICD-10-CM

## 2017-05-28 ENCOUNTER — Other Ambulatory Visit (HOSPITAL_COMMUNITY)
Admission: RE | Admit: 2017-05-28 | Discharge: 2017-05-28 | Disposition: A | Discharge status: Home / Self Care | Payer: Medicaid Other | Source: Ambulatory Visit | Attending: Obstetrics and Gynecology | Admitting: Obstetrics and Gynecology

## 2017-05-28 ENCOUNTER — Ambulatory Visit (INDEPENDENT_AMBULATORY_CARE_PROVIDER_SITE_OTHER): Payer: Medicaid Other | Admitting: Obstetrics and Gynecology

## 2017-05-28 ENCOUNTER — Encounter: Payer: Self-pay | Admitting: Obstetrics and Gynecology

## 2017-05-28 DIAGNOSIS — Z124 Encounter for screening for malignant neoplasm of cervix: Secondary | ICD-10-CM | POA: Insufficient documentation

## 2017-05-28 DIAGNOSIS — Z01818 Encounter for other preprocedural examination: Secondary | ICD-10-CM

## 2017-05-28 DIAGNOSIS — R102 Pelvic and perineal pain: Secondary | ICD-10-CM | POA: Diagnosis not present

## 2017-05-28 NOTE — Progress Notes (Signed)
Patient ID: Heidi [name], female   DOB: April 22, 1991, 26 y.o.   MRN: [id number] Preoperative History and Physical  Heidi [name] is a 26 y.o. G2P0011 here for surgical management of pelvic pain associated with Essure IUD. No significant preoperative concerns. Pt would like her Essure coils returned to her or her lawyer. She has lost a small amount of weight since her last visit. Pt denies any other sx or complaints at this time.   Proposed surgery: Supracervical Hysterectomy with bilateral salpingectomy, with tubes and ovaries to very removed intact with the uterus due to the controversy of the Essure System  Past Medical History:  Diagnosis Date  . Abdominal pain 01/10/2016  . Anxiety   . Back pain   . Bloating 01/10/2016  . Depression   . GERD (gastroesophageal reflux disease)   . Hematuria 01/10/2016  . Herpes simplex virus (HSV) infection   . Hypertension   . Osteoarthritis   . Prediabetes   . Schizo-affective schizophrenia (HCC)   . Type 2 diabetes mellitus (HCC) 03/28/2016  . Urinary frequency 01/10/2016   Past Surgical History:  Procedure Laterality Date  . CESAREAN SECTION    . COLONOSCOPY WITH PROPOFOL N/A 03/17/2017   Procedure: COLONOSCOPY WITH PROPOFOL;  Surgeon: Corbin Ade, MD;  Location: [facility];  Service: Endoscopy;  Laterality: N/A;  9:30am  . DILATION AND CURETTAGE OF UTERUS    . ESOPHAGOGASTRODUODENOSCOPY (EGD) WITH PROPOFOL N/A 03/17/2017   Procedure: ESOPHAGOGASTRODUODENOSCOPY (EGD) WITH PROPOFOL;  Surgeon: Corbin Ade, MD;  Location: [facility];  Service: Endoscopy;  Laterality: N/A;  . ESSURE TUBAL LIGATION     OB History  Gravida Para Term Preterm AB Living  SAB TAB Ectopic Multiple Live Births  1       1    # Outcome Date GA Lbr Len/2nd Weight Sex Delivery Anes PTL Lv  2 SAB           1 Para     M    LIV    Patient denies any other pertinent gynecologic issues.   Current Outpatient Prescriptions on File Prior to Visit    Medication Sig Dispense Refill  . albuterol (PROVENTIL HFA;VENTOLIN HFA) 108 (90 Base) MCG/ACT inhaler Inhale 2 puffs into the lungs every 6 (six) hours as needed for wheezing or shortness of breath. 1 Inhaler 2  . ALPRAZolam (XANAX) 0.25 MG tablet Take 1 tablet (0.25 mg total) by mouth 2 (two) times daily as needed for anxiety. 10 tablet 2  . amLODipine (NORVASC) 5 MG tablet Take 1 tablet (5 mg total) by mouth daily. (Patient taking differently: Take 5 mg by mouth at bedtime. ) 90 tablet 1  . cholecalciferol (VITAMIN D) 1000 units tablet Take 1,000 Units by mouth at bedtime.     . cyclobenzaprine (FLEXERIL) 10 MG tablet TAKE 1 TABLET BY MOUTH THREE TIMES DAILY AS NEEDED FOR MUSCLE SPASM 30 tablet 2  . dexlansoprazole (DEXILANT) 60 MG capsule Take 1 capsule (60 mg total) by mouth daily. 30 capsule 11  . empagliflozin (JARDIANCE) 10 MG TABS tablet Take 10 mg by mouth daily. 30 tablet 3  . Empagliflozin-Metformin HCl ER (SYNJARDY XR) 12-998 MG TB24 Take 1 tablet by mouth 2 (two) times daily. 60 tablet 2  . glucose blood (ACCU-CHEK GUIDE) test strip Use to check BG once daily at varying times of day. 100 each 3  . Lancets 30G MISC Dispense Accu-Chek guide lancets.  Check  blood sugar at varying times once per day. 100 each 11  . lidocaine (XYLOCAINE) 2 % jelly 1 application by Other route as needed. Apply to the rectal area once per day for hemorrhoid discomfort 30 mL 0  . lisinopril (PRINIVIL,ZESTRIL) 20 MG tablet TAKE ONE TABLET BY MOUTH ONCE DAILY 30 tablet 5  . meloxicam (MOBIC) 7.5 MG tablet TAKE 2 TABLETS BY MOUTH ONCE DAILY 60 tablet 2  . metFORMIN (GLUCOPHAGE) 1000 MG tablet Take 1 tablet (1,000 mg total) by mouth 2 (two) times daily with a meal. 60 tablet 3  . NONFORMULARY OR COMPOUNDED ITEM Place 1 application around the anus 4 (four) times daily.    Marland Kitchen OLANZapine (ZYPREXA) 10 MG tablet TAKE 1 TABLET BY MOUTH AT BEDTIME 90 tablet 0  . sertraline (ZOLOFT) 100 MG tablet Take 2 tablets (200 mg  total) by mouth at bedtime. 180 tablet 2  . traZODone (DESYREL) 50 MG tablet Take 1 tablet (50 mg total) by mouth at bedtime as needed for sleep. 30 tablet 2  . triamcinolone cream (KENALOG) 0.1 % Apply 1 application topically 2 (two) times daily as needed (for rash/irritated skin.).     No current facility-administered medications on file prior to visit.    Allergies  Allergen Reactions  . Hydroxyzine     Causes her more anxiety.  [name] [Linaclotide] Hives  . Lorazepam     Sedation  . Morphine And Related Other (See Comments)    Makes pt.aggressive  . Quetiapine     Sweating, irritability    Social History:   reports that she has been smoking Cigarettes.  She has a 10.00 pack-year smoking history. She has never used smokeless tobacco. She reports that she drinks alcohol. She reports that she uses drugs, including Marijuana.  Family History  Problem Relation Age of Onset  . Depression Mother   . Hypertension Mother   . Diabetes Mother   . Mental illness Father   . Hypertension Father   . Diabetes Father   . Cancer Maternal Aunt   . Diabetes Paternal Grandmother   . Hypertension Paternal Grandmother   . Arthritis Paternal Grandmother   . Stroke Paternal Grandfather   . Heart disease Paternal Grandfather   . Hypertension Paternal Grandfather   . Diabetes Paternal Grandfather   . Schizophrenia Maternal Grandfather   . Alcohol abuse Maternal Grandfather   . Other Sister        recovering addict  . Schizophrenia Sister   . Irritable bowel syndrome Sister   . Other Sister        recovering addict  . Bipolar disorder Sister   . Seizures Sister   . Colon cancer Neg Hx   . Inflammatory bowel disease Neg Hx   . Celiac disease Neg Hx     Review of Systems: Noncontributory  PHYSICAL EXAM: Blood pressure 140/70, pulse 78, weight (!) 301 lb 12.8 oz (136.9 kg), last menstrual period 04/30/2017. General appearance - alert, well appearing, and in no distress Chest - clear  to auscultation, no wheezes, rales or rhonchi, symmetric air entry Heart - normal rate and regular rhythm Abdomen - soft, nontender, nondistended, no masses or organomegaly Pelvic - PAP Smear done External genitalia: normal,  Vagina: good support Cervix: requires large, longer speculum to visualize, smooth, PAP done Uterus: Non mobile, difficult to feel Extremities - peripheral pulses normal, no pedal edema, no clubbing or cyanosis        Labs: No results found for this or  any previous visit (from the past 336 hour(s)).  Imaging Studies: No results found.  Assessment: Patient Active Problem List   Diagnosis Date Noted  . Anal fissure 05/06/2017  . 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 Morbid obesity (HCC) 04/15/2017  . Pelvic pain in female 04/15/2017  . GERD (gastroesophageal reflux disease) 03/13/2017  . Abnormal liver function tests 03/13/2017  . Abdominal pain, epigastric 03/13/2017  . Rectal bleed 01/28/2017  . Type 2 diabetes mellitus (HCC) 03/28/2016  . Paresthesia 01/18/2016  . Neuropathic pain 11/10/2015  . Essential hypertension, benign 07/21/2015  . Anxiety and depression 06/22/2015    Plan: 1. Patient will undergo surgical management with supracervical hysterectomy in ~ 3 weeks.May need to consider partial removal of the redundant pannus to allow pelvic access for the surgery 2. Patient brings in a request list she specifically wants to make sure that the abdomen is x-ray prior to the procedure to document the presence of the Essure, and she wants a postprocedure x-ray to document the removal of the posterior completely, as guided by her lawyer L need to discuss this with pathology to see if this is possible 2. F/u post op 4 weeks  .mec 05/28/2017 5:11 PM  By signing my name below, I, Diona Browner, attest that this documentation has been prepared under the direction and in the presence of Tilda Burrow, MD. Electronically Signed:  Diona Browner, Medical Scribe. 05/28/17. 5:11 PM.  I personally performed the services described in this documentation, which was SCRIBED in my presence. The recorded information has been reviewed and considered accurate. It has been edited as necessary during review. Tilda Burrow, MD

## 2017-05-29 ENCOUNTER — Ambulatory Visit: Payer: Medicaid Other | Admitting: General Surgery

## 2017-05-30 LAB — CYTOLOGY - PAP
CHLAMYDIA, DNA PROBE: NEGATIVE
Diagnosis: NEGATIVE
Neisseria Gonorrhea: NEGATIVE

## 2017-06-04 ENCOUNTER — Ambulatory Visit: Payer: Self-pay | Admitting: Pharmacist

## 2017-06-04 ENCOUNTER — Telehealth: Payer: Self-pay | Admitting: Obstetrics and Gynecology

## 2017-06-04 NOTE — Telephone Encounter (Signed)
Patient states she discussed with Dr Emelda Fear at her last visit that she would need a flat panel x-ray to determine location of Essure coils before surgery. Will discuss with Dr Emelda Fear and schedule. Pt verbalized understanding.

## 2017-06-05 ENCOUNTER — Ambulatory Visit: Payer: Medicaid Other | Admitting: General Surgery

## 2017-06-06 ENCOUNTER — Other Ambulatory Visit: Payer: Self-pay | Admitting: Obstetrics and Gynecology

## 2017-06-06 ENCOUNTER — Telehealth: Payer: Self-pay | Admitting: Obstetrics and Gynecology

## 2017-06-06 DIAGNOSIS — Z308 Encounter for other contraceptive management: Secondary | ICD-10-CM

## 2017-06-06 NOTE — Telephone Encounter (Signed)
Phone call to pt informing her that an order for a 2-way abdomen has been placed. Call to xray confirming that the order was placed. Pt is to call xray at [phone] and arrange x ray.

## 2017-06-10 ENCOUNTER — Ambulatory Visit (HOSPITAL_COMMUNITY)
Admission: RE | Admit: 2017-06-10 | Discharge: 2017-06-10 | Disposition: A | Discharge status: Home / Self Care | Payer: Medicaid Other | Source: Ambulatory Visit | Attending: Obstetrics and Gynecology | Admitting: Obstetrics and Gynecology

## 2017-06-10 DIAGNOSIS — Z308 Encounter for other contraceptive management: Secondary | ICD-10-CM | POA: Diagnosis not present

## 2017-06-15 ENCOUNTER — Encounter: Payer: Self-pay | Admitting: Obstetrics and Gynecology

## 2017-06-16 ENCOUNTER — Encounter: Payer: Self-pay | Admitting: Family Medicine

## 2017-06-17 MED ORDER — CYCLOBENZAPRINE HCL 10 MG PO TABS: 10.0000 mg | ORAL_TABLET | Freq: Three times a day (TID) | ORAL | 2 refills | Status: DC | PRN

## 2017-06-18 ENCOUNTER — Encounter: Payer: Self-pay | Admitting: Obstetrics and Gynecology

## 2017-06-18 NOTE — Patient Instructions (Signed)
Heidi [name]  06/18/2017     @PREFPERIOPPHARMACY @   Your procedure is scheduled on  06/24/2017 .  Report to [facility] at  725  A.M.  Call this number if you have problems the morning of surgery:  [phone]   Remember:  Do not eat food or drink liquids after midnight.  Take these medicines the morning of surgery with A SIP OF WATER  Xanax, flexaril, dexilant, lisinopril, zoloft. Use your inhaler before you come.   Do not wear jewelry, make-up or nail polish.  Do not wear lotions, powders, or perfumes, or deoderant.  Do not shave 48 hours prior to surgery.  Men may shave face and neck.  Do not bring valuables to the hospital.  [facility] is not responsible for any belongings or valuables.  Contacts, dentures or bridgework may not be worn into surgery.  Leave your suitcase in the car.  After surgery it may be brought to your room.  For patients admitted to the hospital, discharge time will be determined by your treatment team.  Patients discharged the day of surgery will not be allowed to drive home.   Name and phone number of your driver:   family Special instructions:  Follow the prep instructions given to you by Dr Emelda Fear (enclosed).  Please read over the following fact sheets that you were given. Anesthesia Post-op Instructions and Care and Recovery After Surgery      Supracervical Hysterectomy A supracervical hysterectomy is surgery to remove the top part of the uterus, but not the cervix. You will no longer have menstrual periods or be able to get pregnant after this surgery. The fallopian tubes and ovaries may also be removed (bilateral salpingo-oophorectomy) during this surgery. This surgery is usually performed using a minimally invasive technique called laparoscopy. This technique allows the surgery to be done through small incisions. The minimally invasive technique provides benefits such as less pain, less risk of infection, and shorter  recovery time. Tell a health care provider about:  Any allergies you have.  All medicines you are taking, including vitamins, herbs, eye drops, creams, and over-the-counter medicines.  Any problems you or family members have had with anesthetic medicines.  Any blood disorders you have.  Any surgeries you have had.  Any medical conditions you have. What are the risks? Generally, this is a safe procedure. However, as with any procedure, complications can occur. Possible complications include:  Bleeding.  Blood clots in the legs or lung.  Infection.  Injury to surrounding organs.  Problems related to anesthesia.  Conversion to an open abdominal surgery.  Additional surgery later to remove the cervix if you have problems with the cervix.  What happens before the procedure?  Ask your health care provider about changing or stopping your regular medicines.  Do not take aspirin or blood thinners (anticoagulants) for 1 week before the surgery, or as directed by your health care provider.  Do not eat or drink anything for 8 hours before the surgery, or as directed by your health care provider.  Quit smoking if you smoke.  Arrange for a ride home after surgery and for someone to help you at home during recovery. What happens during the procedure?  You will be given an antibiotic medicine.  An IV tube will be placed in one of your veins. You will be given medicine to make you sleep (general anesthetic).  A gas (carbon dioxide) will be  used to inflate your abdomen. This will allow your surgeon to look inside your abdomen, perform your surgery, and treat any other problems found if necessary.  Three or four small incisions will be made in your abdomen. One of these incisions will be made in the area of your belly button (navel). A thin, flexible tube with a tiny camera and light on the end of it (laparoscope) will be inserted into the incision. The camera on the laparoscope sends  a picture to a TV screen in the operating room. This gives your surgeon a good view inside the abdomen.  Other surgical instruments will be inserted through the other incisions.  The uterus will be cut into small pieces and removed through the small incisions.  Your incisions will be closed. What happens after the procedure?  You will be taken to a recovery area where your progress will be monitored until you are awake, stable, and taking fluids well. If there are no other problems, you will then be moved to a regular hospital room, or you will be allowed to go home.  You will likely have minimal discomfort after the surgery because the incisions are so small with the laparoscopic technique.  You will be given pain medicine while you are in the hospital and for when you go home.  If a bilateral salpingo-oophorectomy was performed before menopause, you will go through a sudden (abrupt) menopause. This can be helped with hormone medicines. This information is not intended to replace advice given to you by your health care provider. Make sure you discuss any questions you have with your health care provider. Document Released: 02/05/2008 Document Revised: 01/25/2016 Document Reviewed: 02/19/2013 Elsevier Interactive Patient Education  2018 [employer].  Salpingectomy Salpingectomy, also called tubectomy, is the surgical removal of one of the fallopian tubes. The fallopian tubes are where eggs travel from the ovaries to the uterus. Removing one fallopian tube does not prevent you from becoming pregnant. It also does not cause problems with your menstrual periods. You may need a salpingectomy if you:  Have a fertilized egg that attaches to the fallopian tube (ectopic pregnancy), especially one that causes the tube to burst or tear (rupture).  Have an infected fallopian tube.  Have cancer of the fallopian tube or nearby organs.  Have had an ovary removed due to a cyst or tumor.  Have had  your uterus removed.  There are three different methods that can be used for a salpingectomy:  Open. This method involves making one large incision in your abdomen.  Laparoscopic. This method involves using a thin, lighted tube with a tiny camera on the end (laparoscope) to help perform the procedure. The laparoscope will allow your surgeon to make several small incisions in the abdomen instead of a large incision.  Robot-assisted: This method involves using a computer to control surgical instruments that are attached to robotic arms.  Tell a health care provider about:  Any allergies you have.  All medicines you are taking, including vitamins, herbs, eye drops, creams, and over-the-counter medicines.  Any problems you or family members have had with anesthetic medicines.  Any blood disorders you have.  Any surgeries you have had.  Any medical conditions you have.  Whether you are pregnant or may be pregnant. What are the risks? Generally, this is a safe procedure. However, problems may occur, including:  Infection.  Bleeding.  Allergic reactions to medicines.  Damage to other structures or organs.  Blood clots in the legs  or lungs.  What happens before the procedure? Staying hydrated Follow instructions from your health care provider about hydration, which may include:  Up to 2 hours before the procedure - you may continue to drink clear liquids, such as water, clear fruit juice, black coffee, and plain tea.  Eating and drinking restrictions Follow instructions from your health care provider about eating and drinking, which may include:  8 hours before the procedure - stop eating heavy meals or foods such as meat, fried foods, or fatty foods.  6 hours before the procedure - stop eating light meals or foods, such as toast or cereal.  6 hours before the procedure - stop drinking milk or drinks that contain milk.  2 hours before the procedure - stop drinking clear  liquids.  Medicines  Ask your health care provider about: ? Changing or stopping your regular medicines. This is especially important if you are taking diabetes medicines or blood thinners. ? Taking medicines such as aspirin and ibuprofen. These medicines can thin your blood. Do not take these medicines before your procedure if your health care provider instructs you not to.  You may be given antibiotic medicine to help prevent infection. General instructions  Do not smoke for at least 2 weeks before your procedure. If you need help quitting, ask your health care provider.  You may have an exam or tests, such as an electrocardiogram (ECG).  You may have a blood or urine sample taken.  Ask your health care provider: ? Whether you should stop removing hair from your surgical area. ? How your surgical site will be marked or identified.  You may be asked to shower with a germ-killing soap.  Plan to have someone take you home from the hospital or clinic.  If you will be going home right after the procedure, plan to have someone with you for 24 hours. What happens during the procedure?  To reduce your risk of infection: ? Your health care team will wash or sanitize their hands. ? Hair may be removed from the surgical area. ? Your skin will be washed with soap.  An IV tube will be inserted into one of your veins.  You will be given a medicine to make you fall asleep (general anesthetic). You may also be given a medicine to help you relax (sedative).  A thin tube (catheter) may be inserted through your urethra and into your bladder to drain urine during your procedure.  Depending on the type of procedure you are having, one incision or several small incisions will be made in your abdomen.  Your fallopian tube will be cut and removed from where it attaches to your uterus.  Your blood vessels will be clamped and tied to prevent excess bleeding.  The incision(s) in your abdomen will  be closed with stitches (sutures), staples, or skin glue.  A bandage (dressing) may be placed over your incision(s). The procedure may vary among health care providers and hospitals. What happens after the procedure?  Your blood pressure, heart rate, breathing rate, and blood oxygen level will be monitored until the medicines you were given have worn off.  You may continue to receive fluids and medicines through an IV tube.  You may continue to have a catheter draining your urine.  You may have to wear compression stockings. These stockings help to prevent blood clots and reduce swelling in your legs.  You will be given pain medicine as needed.  Do not drive for 24 hours  if you received a sedative. Summary  Salpingectomy is a surgical procedure to remove one of the fallopian tubes.  The procedure may be done with an open incision, with a laparoscope, or with computer-controlled instruments.  Depending on the type of procedure you are having, one incision or several small incisions will be made in your abdomen.  Your blood pressure, heart rate, breathing rate, and blood oxygen level will be monitored until the medicines you were given have worn off.  Plan to have someone take you home from the hospital or clinic. This information is not intended to replace advice given to you by your health care provider. Make sure you discuss any questions you have with your health care provider. Document Released: 01/05/2009 Document Revised: 04/05/2016 Document Reviewed: 02/10/2013 Elsevier Interactive Patient Education  2018 [employer] Supracervical Hysterectomy, Care After Refer to this sheet in the next few weeks. These instructions provide you with information on caring for yourself after your procedure. Your health care provider may also give you more specific instructions. Your treatment has been planned according to current medical practices, but problems sometimes occur. Call your health  care provider if you have any problems or questions after your procedure. What can I expect after the procedure? After your procedure, it is typical to have some discomfort, tenderness, swelling, and bruising at the surgical sites. This normally lasts for about 2 weeks. Follow these instructions at home:  Get plenty of rest and sleep.  Only take over-the-counter or prescription medicines as directed by your health care provider.  Do not take aspirin. It can cause bleeding.  Do not drive until your health care provider approves.  Follow your health care provider's advice regarding exercise, lifting, and general activities.  Resume your usual diet as directed by your health care provider.  Do not douche, use tampons, or have sexual intercourse for at least 6 weeks or until your health care provider gives you permission.  Change your bandages (dressings) only as directed by your health care provider.  Monitor your temperature.  Take showers instead of baths for 2-3 weeks or as directed by your health care provider.  Drink enough fluids to keep your urine clear or pale yellow.  Do not drink alcohol until your health care provider gives you permission.  If you are constipated, you may take a mild laxative if your health care provider approves. Bran foods may also help with constipation problems.  Try to have someone home with you for 1-2 weeks to help with activities.  Follow up with your health care provider as directed. Contact a health care provider if:  You have swelling, redness, or increasing pain in the incision area.  You have pus coming from an incision.  You notice a bad smell coming from the incision or dressing.  You have swelling, redness, or pain in the area around the IV site.  Your incision breaks open.  You feel dizzy or lightheaded.  You have pain or bleeding when you urinate.  You have persistent diarrhea.  You have persistent nausea and  vomiting.  You have abnormal vaginal discharge.  You have a rash.  Your pain is not controlled with your prescribed medicine. Get help right away if:  You have a fever.  You have severe abdominal pain.  You have chest pain.  You have shortness of breath.  You faint.  You have pain, swelling, or redness in your leg.  You have heavy vaginal bleeding with blood clots. This information  is not intended to replace advice given to you by your health care provider. Make sure you discuss any questions you have with your health care provider. Document Released: 06/09/2013 Document Revised: 01/25/2016 Document Reviewed: 02/19/2013 Elsevier Interactive Patient Education  2018 [employer].  General Anesthesia, Adult General anesthesia is the use of medicines to make a person "go to sleep" (be unconscious) for a medical procedure. General anesthesia is often recommended when a procedure:  Is long.  Requires you to be still or in an unusual position.  Is major and can cause you to lose blood.  Is impossible to do without general anesthesia.  The medicines used for general anesthesia are called general anesthetics. In addition to making you sleep, the medicines:  Prevent pain.  Control your blood pressure.  Relax your muscles.  Tell a health care provider about:  Any allergies you have.  All medicines you are taking, including vitamins, herbs, eye drops, creams, and over-the-counter medicines.  Any problems you or family members have had with anesthetic medicines.  Types of anesthetics you have had in the past.  Any bleeding disorders you have.  Any surgeries you have had.  Any medical conditions you have.  Any history of heart or lung conditions, such as heart failure, sleep apnea, or chronic obstructive pulmonary disease (COPD).  Whether you are pregnant or may be pregnant.  Whether you use tobacco, alcohol, marijuana, or street drugs.  Any history of Social worker.  Any history of depression or anxiety. What are the risks? Generally, this is a safe procedure. However, problems may occur, including:  Allergic reaction to anesthetics.  Lung and heart problems.  Inhaling food or liquids from your stomach into your lungs (aspiration).  Injury to nerves.  Waking up during your procedure and being unable to move (rare).  Extreme agitation or a state of mental confusion (delirium) when you wake up from the anesthetic.  Air in the bloodstream, which can lead to stroke.  These problems are more likely to develop if you are having a major surgery or if you have an advanced medical condition. You can prevent some of these complications by answering all of your health care provider's questions thoroughly and by following all pre-procedure instructions. General anesthesia can cause side effects, including:  Nausea or vomiting  A sore throat from the breathing tube.  Feeling cold or shivery.  Feeling tired, washed out, or achy.  Sleepiness or drowsiness.  Confusion or agitation.  What happens before the procedure? Staying hydrated Follow instructions from your health care provider about hydration, which may include:  Up to 2 hours before the procedure - you may continue to drink clear liquids, such as water, clear fruit juice, black coffee, and plain tea.  Eating and drinking restrictions Follow instructions from your health care provider about eating and drinking, which may include:  8 hours before the procedure - stop eating heavy meals or foods such as meat, fried foods, or fatty foods.  6 hours before the procedure - stop eating light meals or foods, such as toast or cereal.  6 hours before the procedure - stop drinking milk or drinks that contain milk.  2 hours before the procedure - stop drinking clear liquids.  Medicines  Ask your health care provider about: ? Changing or stopping your regular medicines. This is  especially important if you are taking diabetes medicines or blood thinners. ? Taking medicines such as aspirin and ibuprofen. These medicines can thin your blood. Do  not take these medicines before your procedure if your health care provider instructs you not to. ? Taking new dietary supplements or medicines. Do not take these during the week before your procedure unless your health care provider approves them.  If you are told to take a medicine or to continue taking a medicine on the day of the procedure, take the medicine with sips of water. General instructions   Ask if you will be going home the same day, the following day, or after a longer hospital stay. ? Plan to have someone take you home. ? Plan to have someone stay with you for the first 24 hours after you leave the hospital or clinic.  For 3-6 weeks before the procedure, try not to use any tobacco products, such as cigarettes, chewing tobacco, and e-cigarettes.  You may brush your teeth on the morning of the procedure, but make sure to spit out the toothpaste. What happens during the procedure?  You will be given anesthetics through a mask and through an IV tube in one of your veins.  You may receive medicine to help you relax (sedative).  As soon as you are asleep, a breathing tube may be used to help you breathe.  An anesthesia specialist will stay with you throughout the procedure. He or she will help keep you comfortable and safe by continuing to give you medicines and adjusting the amount of medicine that you get. He or she will also watch your blood pressure, pulse, and oxygen levels to make sure that the anesthetics do not cause any problems.  If a breathing tube was used to help you breathe, it will be removed before you wake up. The procedure may vary among health care providers and hospitals. What happens after the procedure?  You will wake up, often slowly, after the procedure is complete, usually in a recovery  area.  Your blood pressure, heart rate, breathing rate, and blood oxygen level will be monitored until the medicines you were given have worn off.  You may be given medicine to help you calm down if you feel anxious or agitated.  If you will be going home the same day, your health care provider may check to make sure you can stand, drink, and urinate.  Your health care providers will treat your pain and side effects before you go home.  Do not drive for 24 hours if you received a sedative.  You may: ? Feel nauseous and vomit. ? Have a sore throat. ? Have mental slowness. ? Feel cold or shivery. ? Feel sleepy. ? Feel tired. ? Feel sore or achy, even in parts of your body where you did not have surgery. This information is not intended to replace advice given to you by your health care provider. Make sure you discuss any questions you have with your health care provider. Document Released: 11/26/2007 Document Revised: 01/30/2016 Document Reviewed: 08/03/2015 Elsevier Interactive Patient Education  2018 [employer]. General Anesthesia, Adult, Care After These instructions provide you with information about caring for yourself after your procedure. Your health care provider may also give you more specific instructions. Your treatment has been planned according to current medical practices, but problems sometimes occur. Call your health care provider if you have any problems or questions after your procedure. What can I expect after the procedure? After the procedure, it is common to have:  Vomiting.  A sore throat.  Mental slowness.  It is common to feel:  Nauseous.  Cold  or shivery.  Sleepy.  Tired.  Sore or achy, even in parts of your body where you did not have surgery.  Follow these instructions at home: For at least 24 hours after the procedure:  Do not: ? Participate in activities where you could fall or become injured. ? Drive. ? Use heavy machinery. ? Drink  alcohol. ? Take sleeping pills or medicines that cause drowsiness. ? Make important decisions or sign legal documents. ? Take care of children on your own.  Rest. Eating and drinking  If you vomit, drink water, juice, or soup when you can drink without vomiting.  Drink enough fluid to keep your urine clear or pale yellow.  Make sure you have little or no nausea before eating solid foods.  Follow the diet recommended by your health care provider. General instructions  Have a responsible adult stay with you until you are awake and alert.  Return to your normal activities as told by your health care provider. Ask your health care provider what activities are safe for you.  Take over-the-counter and prescription medicines only as told by your health care provider.  If you smoke, do not smoke without supervision.  Keep all follow-up visits as told by your health care provider. This is important. Contact a health care provider if:  You continue to have nausea or vomiting at home, and medicines are not helpful.  You cannot drink fluids or start eating again.  You cannot urinate after 8-12 hours.  You develop a skin rash.  You have fever.  You have increasing redness at the site of your procedure. Get help right away if:  You have difficulty breathing.  You have chest pain.  You have unexpected bleeding.  You feel that you are having a life-threatening or urgent problem. This information is not intended to replace advice given to you by your health care provider. Make sure you discuss any questions you have with your health care provider. Document Released: 11/25/2000 Document Revised: 01/22/2016 Document Reviewed: 08/03/2015 Elsevier Interactive Patient Education  [employer].

## 2017-06-19 ENCOUNTER — Other Ambulatory Visit: Payer: Self-pay | Admitting: Obstetrics and Gynecology

## 2017-06-19 ENCOUNTER — Telehealth: Payer: Self-pay | Admitting: Obstetrics and Gynecology

## 2017-06-19 DIAGNOSIS — R102 Pelvic and perineal pain: Secondary | ICD-10-CM

## 2017-06-19 NOTE — Telephone Encounter (Signed)
I have made a call to Ms. Heidi [name] and gone through her preop list request line by line and we've agreed on supracervical hysterectomy. We've already done the flat plate x-ray of the abdomen and shows no migration issues regarding the coils [facility] were going to take out the tubes and uterus as one piece, leaving the cervix I will take a picture with my iPhone and included directly in the records as seems appropriate during the case. There will not be any fluoroscopic x-rays taken during the case. We will not give morcellating the uterus. We will not cut through the Essure coils during the surgical procedure we do not routinely use clips and that'll ring the surgery and previous conversations with Ms. Heidi [name] she agreed that should clips be necessary due to bleeding complications of the surgery that if those were necessary part of caring for her that titanium clips as used a standard part of surgery would be acceptable we've agreed to multiple independent sutures and the vaginal cuff we plan to keep leave the ovaries unless is a serious problem identified in the surgery. The coils will be sent to pathology and I will notify pathology of her desire for serum medical test on the tissue removed we will do a flat plate abdomen x-ray on postop day 1 or 2 prior to her leaving the hospital

## 2017-06-20 ENCOUNTER — Encounter (HOSPITAL_COMMUNITY): Payer: Self-pay

## 2017-06-20 ENCOUNTER — Encounter (HOSPITAL_COMMUNITY)
Admission: RE | Admit: 2017-06-20 | Discharge: 2017-06-20 | Disposition: A | Discharge status: Home / Self Care | Payer: Medicaid Other | Source: Ambulatory Visit | Attending: Obstetrics and Gynecology | Admitting: Obstetrics and Gynecology

## 2017-06-20 DIAGNOSIS — F17200 Nicotine dependence, unspecified, uncomplicated: Secondary | ICD-10-CM | POA: Diagnosis not present

## 2017-06-20 DIAGNOSIS — K219 Gastro-esophageal reflux disease without esophagitis: Secondary | ICD-10-CM | POA: Insufficient documentation

## 2017-06-20 DIAGNOSIS — Z01812 Encounter for preprocedural laboratory examination: Secondary | ICD-10-CM | POA: Insufficient documentation

## 2017-06-20 DIAGNOSIS — E669 Obesity, unspecified: Secondary | ICD-10-CM | POA: Insufficient documentation

## 2017-06-20 DIAGNOSIS — I10 Essential (primary) hypertension: Secondary | ICD-10-CM | POA: Diagnosis not present

## 2017-06-20 DIAGNOSIS — M1990 Unspecified osteoarthritis, unspecified site: Secondary | ICD-10-CM | POA: Diagnosis not present

## 2017-06-20 DIAGNOSIS — E119 Type 2 diabetes mellitus without complications: Secondary | ICD-10-CM | POA: Diagnosis not present

## 2017-06-20 DIAGNOSIS — F329 Major depressive disorder, single episode, unspecified: Secondary | ICD-10-CM | POA: Insufficient documentation

## 2017-06-20 DIAGNOSIS — F419 Anxiety disorder, unspecified: Secondary | ICD-10-CM | POA: Diagnosis not present

## 2017-06-20 DIAGNOSIS — R102 Pelvic and perineal pain: Secondary | ICD-10-CM | POA: Diagnosis not present

## 2017-06-20 LAB — URINALYSIS, ROUTINE W REFLEX MICROSCOPIC
Bilirubin Urine: NEGATIVE
HGB URINE DIPSTICK: NEGATIVE
Ketones, ur: NEGATIVE mg/dL
LEUKOCYTES UA: NEGATIVE
NITRITE: NEGATIVE
PROTEIN: NEGATIVE mg/dL
RBC / HPF: NONE SEEN RBC/hpf (ref 0–5)
Specific Gravity, Urine: 1.008 (ref 1.005–1.030)
pH: 6 (ref 5.0–8.0)

## 2017-06-20 LAB — COMPREHENSIVE METABOLIC PANEL
ALBUMIN: 4.2 g/dL (ref 3.5–5.0)
ALT: 29 U/L (ref 14–54)
AST: 21 U/L (ref 15–41)
Alkaline Phosphatase: 124 U/L (ref 38–126)
Anion gap: 12 (ref 5–15)
BUN: 8 mg/dL (ref 6–20)
CHLORIDE: 106 mmol/L (ref 101–111)
CO2: 21 mmol/L — AB (ref 22–32)
Calcium: 9.5 mg/dL (ref 8.9–10.3)
Creatinine, Ser: 0.66 mg/dL (ref 0.44–1.00)
GFR calc Af Amer: >60 mL/min (ref >60)
GFR calc non Af Amer: >60 mL/min (ref >60)
GLUCOSE: 143 mg/dL — AB (ref 65–99)
POTASSIUM: 3.7 mmol/L (ref 3.5–5.1)
SODIUM: 139 mmol/L (ref 135–145)
Total Bilirubin: 0.3 mg/dL (ref 0.3–1.2)
Total Protein: 7.3 g/dL (ref 6.5–8.1)

## 2017-06-20 LAB — CBC WITH DIFFERENTIAL/PLATELET
BASOS ABS: 0.1 10*3/uL (ref 0.0–0.1)
BASOS PCT: 0 %
EOS PCT: 1 %
Eosinophils Absolute: 0.1 10*3/uL (ref 0.0–0.7)
HCT: 35.6 % — ABNORMAL LOW (ref 36.0–46.0)
Hemoglobin: 10.5 g/dL — ABNORMAL LOW (ref 12.0–15.0)
Lymphocytes Relative: 20 %
Lymphs Abs: 2.4 10*3/uL (ref 0.7–4.0)
MCH: 21.6 pg — ABNORMAL LOW (ref 26.0–34.0)
MCHC: 29.5 g/dL — ABNORMAL LOW (ref 30.0–36.0)
MCV: 73.1 fL — ABNORMAL LOW (ref 78.0–100.0)
MONO ABS: 0.5 10*3/uL (ref 0.1–1.0)
Monocytes Relative: 4 %
Neutro Abs: 8.9 10*3/uL — ABNORMAL HIGH (ref 1.7–7.7)
Neutrophils Relative %: 74 %
PLATELETS: 231 10*3/uL (ref 150–400)
RBC: 4.87 MIL/uL (ref 3.87–5.11)
RDW: 18.1 % — AB (ref 11.5–15.5)
WBC: 12 10*3/uL — AB (ref 4.0–10.5)

## 2017-06-20 LAB — HCG, SERUM, QUALITATIVE: PREG SERUM: NEGATIVE

## 2017-06-21 LAB — FOLLICLE STIMULATING HORMONE: FSH: 5 m[IU]/mL

## 2017-06-22 ENCOUNTER — Inpatient Hospital Stay (HOSPITAL_COMMUNITY): Admit: 2017-06-22 | Payer: Self-pay

## 2017-06-22 ENCOUNTER — Other Ambulatory Visit: Payer: Self-pay | Admitting: Obstetrics and Gynecology

## 2017-06-23 ENCOUNTER — Telehealth: Payer: Self-pay | Admitting: Family Medicine

## 2017-06-23 ENCOUNTER — Telehealth: Payer: Self-pay | Admitting: Obstetrics and Gynecology

## 2017-06-23 LAB — HEMOGLOBIN A1C
HEMOGLOBIN A1C: 9.3 % — AB (ref 4.8–5.6)
Mean Plasma Glucose: 220.21 mg/dL

## 2017-06-23 MED ORDER — BUPROPION HCL ER (XL) 150 MG PO TB24: 150.0000 mg | ORAL_TABLET | Freq: Every day | ORAL | 2 refills | Status: DC

## 2017-06-23 NOTE — Telephone Encounter (Signed)
I sent Wellbutrin for smoking cessation for the patient

## 2017-06-23 NOTE — Telephone Encounter (Signed)
Patient informed of elevated hemoglobin A1c of 9.3. Patient reports that she is only been checking her blood sugar once or twice a week. She alleges that she's been taking her jardianZ and metformin combination pill.

## 2017-06-23 NOTE — Telephone Encounter (Signed)
Pt called stating that she is having surgery tomorrow and Dr, Glo Herring told her that she needed to take a medication to flush out her bowels, pt would like to know if he is going to call it in to her pharmacy or should she get it over the counter. Pt also states that they gave her a kit to shower will the night before and lay on clean sheets. Pt states that she does not have a washer or drier and would like to know if she could use it the morning of. Please contact pt

## 2017-06-23 NOTE — Telephone Encounter (Signed)
Yes I sent the prescription already for her.

## 2017-06-23 NOTE — Telephone Encounter (Signed)
Patient called stating she is unable to change her sheets tonight because she does not have a washer/dryer and wants to know if she can shower with the solution in the morning. Also states she is supposed to do a bowel prep but wasn't given anything to take. Advised patient to call the number listed on her instructions to get more information regarding her questions. Pt verbalized understanding and will call back if she has further questions.

## 2017-06-24 ENCOUNTER — Inpatient Hospital Stay (HOSPITAL_COMMUNITY)
Admission: RE | Admit: 2017-06-24 | Discharge: 2017-06-26 | DRG: 674 | Disposition: A | Discharge status: Skilled Nursing Facility | Payer: Medicaid Other | Source: Ambulatory Visit | Attending: Obstetrics and Gynecology | Admitting: Obstetrics and Gynecology

## 2017-06-24 ENCOUNTER — Encounter (HOSPITAL_COMMUNITY): Payer: Self-pay | Admitting: *Deleted

## 2017-06-24 ENCOUNTER — Inpatient Hospital Stay (HOSPITAL_COMMUNITY): Payer: Medicaid Other | Admitting: Anesthesiology

## 2017-06-24 ENCOUNTER — Encounter (HOSPITAL_COMMUNITY)
Admission: RE | Disposition: A | Discharge status: Skilled Nursing Facility | Payer: Self-pay | Source: Ambulatory Visit | Attending: Obstetrics and Gynecology

## 2017-06-24 DIAGNOSIS — Z820 Family history of epilepsy and other diseases of the nervous system: Secondary | ICD-10-CM

## 2017-06-24 DIAGNOSIS — F259 Schizoaffective disorder, unspecified: Secondary | ICD-10-CM | POA: Diagnosis present

## 2017-06-24 DIAGNOSIS — Z6841 Body Mass Index (BMI) 40.0 and over, adult: Secondary | ICD-10-CM | POA: Diagnosis not present

## 2017-06-24 DIAGNOSIS — Y763 Surgical instruments, materials and obstetric and gynecological devices (including sutures) associated with adverse incidents: Secondary | ICD-10-CM | POA: Diagnosis present

## 2017-06-24 DIAGNOSIS — Z8249 Family history of ischemic heart disease and other diseases of the circulatory system: Secondary | ICD-10-CM | POA: Diagnosis not present

## 2017-06-24 DIAGNOSIS — Z809 Family history of malignant neoplasm, unspecified: Secondary | ICD-10-CM

## 2017-06-24 DIAGNOSIS — I10 Essential (primary) hypertension: Secondary | ICD-10-CM | POA: Diagnosis present

## 2017-06-24 DIAGNOSIS — M1990 Unspecified osteoarthritis, unspecified site: Secondary | ICD-10-CM | POA: Diagnosis present

## 2017-06-24 DIAGNOSIS — Z823 Family history of stroke: Secondary | ICD-10-CM | POA: Diagnosis not present

## 2017-06-24 DIAGNOSIS — Z7984 Long term (current) use of oral hypoglycemic drugs: Secondary | ICD-10-CM

## 2017-06-24 DIAGNOSIS — R102 Pelvic and perineal pain: Secondary | ICD-10-CM | POA: Diagnosis not present

## 2017-06-24 DIAGNOSIS — Z885 Allergy status to narcotic agent status: Secondary | ICD-10-CM

## 2017-06-24 DIAGNOSIS — Z818 Family history of other mental and behavioral disorders: Secondary | ICD-10-CM | POA: Diagnosis not present

## 2017-06-24 DIAGNOSIS — K660 Peritoneal adhesions (postprocedural) (postinfection): Secondary | ICD-10-CM | POA: Diagnosis present

## 2017-06-24 DIAGNOSIS — F419 Anxiety disorder, unspecified: Secondary | ICD-10-CM | POA: Diagnosis present

## 2017-06-24 DIAGNOSIS — K219 Gastro-esophageal reflux disease without esophagitis: Secondary | ICD-10-CM | POA: Diagnosis present

## 2017-06-24 DIAGNOSIS — N809 Endometriosis, unspecified: Secondary | ICD-10-CM | POA: Diagnosis present

## 2017-06-24 DIAGNOSIS — T8384XA Pain from genitourinary prosthetic devices, implants and grafts, initial encounter: Secondary | ICD-10-CM | POA: Diagnosis present

## 2017-06-24 DIAGNOSIS — Z79899 Other long term (current) drug therapy: Secondary | ICD-10-CM

## 2017-06-24 DIAGNOSIS — Z833 Family history of diabetes mellitus: Secondary | ICD-10-CM

## 2017-06-24 DIAGNOSIS — E119 Type 2 diabetes mellitus without complications: Secondary | ICD-10-CM | POA: Diagnosis present

## 2017-06-24 DIAGNOSIS — Z888 Allergy status to other drugs, medicaments and biological substances status: Secondary | ICD-10-CM | POA: Diagnosis not present

## 2017-06-24 DIAGNOSIS — Z90711 Acquired absence of uterus with remaining cervical stump: Secondary | ICD-10-CM | POA: Diagnosis present

## 2017-06-24 HISTORY — PX: SUPRACERVICAL ABDOMINAL HYSTERECTOMY: SHX5393

## 2017-06-24 HISTORY — PX: BILATERAL SALPINGECTOMY: SHX5743

## 2017-06-24 LAB — GLUCOSE, CAPILLARY
GLUCOSE-CAPILLARY: 149 mg/dL — AB (ref 65–99)
GLUCOSE-CAPILLARY: 160 mg/dL — AB (ref 65–99)
Glucose-Capillary: 197 mg/dL — ABNORMAL HIGH (ref 65–99)
Glucose-Capillary: 228 mg/dL — ABNORMAL HIGH (ref 65–99)

## 2017-06-24 SURGERY — HYSTERECTOMY, SUPRACERVICAL, ABDOMINAL
Anesthesia: General

## 2017-06-24 MED ORDER — CEFAZOLIN SODIUM-DEXTROSE 2-4 GM/100ML-% IV SOLN
2.0000 g | Freq: Once | INTRAVENOUS | Status: AC
  Administered 2017-06-24: 1 g via INTRAVENOUS
  Administered 2017-06-24: 2 g via INTRAVENOUS
  Filled 2017-06-24: qty 100

## 2017-06-24 MED ORDER — NEOSTIGMINE METHYLSULFATE 10 MG/10ML IV SOLN
INTRAVENOUS | Status: DC | PRN
  Administered 2017-06-24: 4 mg via INTRAVENOUS

## 2017-06-24 MED ORDER — DIPHENHYDRAMINE HCL 50 MG/ML IJ SOLN: 12.5000 mg | Freq: Four times a day (QID) | INTRAMUSCULAR | Status: DC | PRN

## 2017-06-24 MED ORDER — SUCCINYLCHOLINE CHLORIDE 20 MG/ML IJ SOLN
INTRAMUSCULAR | Status: AC
  Filled 2017-06-24: qty 1

## 2017-06-24 MED ORDER — HYDROMORPHONE HCL 1 MG/ML IJ SOLN
INTRAMUSCULAR | Status: AC
  Filled 2017-06-24: qty 1

## 2017-06-24 MED ORDER — ROCURONIUM BROMIDE 50 MG/5ML IV SOLN
INTRAVENOUS | Status: AC
  Filled 2017-06-24: qty 1

## 2017-06-24 MED ORDER — KETOROLAC TROMETHAMINE 30 MG/ML IJ SOLN
30.0000 mg | Freq: Once | INTRAMUSCULAR | Status: AC
  Administered 2017-06-24: 30 mg via INTRAVENOUS
  Filled 2017-06-24: qty 1

## 2017-06-24 MED ORDER — INSULIN ASPART 100 UNIT/ML ~~LOC~~ SOLN
0.0000 [IU] | Freq: Three times a day (TID) | SUBCUTANEOUS | Status: DC
  Administered 2017-06-24: 7 [IU] via SUBCUTANEOUS
  Administered 2017-06-25: 3 [IU] via SUBCUTANEOUS
  Administered 2017-06-25: 4 [IU] via SUBCUTANEOUS
  Administered 2017-06-25: 7 [IU] via SUBCUTANEOUS
  Administered 2017-06-26: 4 [IU] via SUBCUTANEOUS

## 2017-06-24 MED ORDER — OXYCODONE HCL 5 MG/5ML PO SOLN: 5.0000 mg | Freq: Once | ORAL | Status: DC | PRN

## 2017-06-24 MED ORDER — ROCURONIUM BROMIDE 100 MG/10ML IV SOLN
INTRAVENOUS | Status: DC | PRN
  Administered 2017-06-24: 40 mg via INTRAVENOUS
  Administered 2017-06-24: 20 mg via INTRAVENOUS
  Administered 2017-06-24 (×2): 10 mg via INTRAVENOUS

## 2017-06-24 MED ORDER — NALOXONE HCL 0.4 MG/ML IJ SOLN: 0.4000 mg | INTRAMUSCULAR | Status: DC | PRN

## 2017-06-24 MED ORDER — IBUPROFEN 600 MG PO TABS: 600.0000 mg | ORAL_TABLET | Freq: Four times a day (QID) | ORAL | Status: DC | PRN

## 2017-06-24 MED ORDER — PANTOPRAZOLE SODIUM 40 MG PO TBEC: 40.0000 mg | DELAYED_RELEASE_TABLET | Freq: Every day | ORAL | Status: DC

## 2017-06-24 MED ORDER — HYDROMORPHONE 1 MG/ML IV SOLN
INTRAVENOUS | Status: DC
  Administered 2017-06-24: 14:00:00 via INTRAVENOUS
  Administered 2017-06-24: 0.9 mg via INTRAVENOUS
  Administered 2017-06-25: 2.4 mg via INTRAVENOUS
  Administered 2017-06-25: 0.9 mg via INTRAVENOUS

## 2017-06-24 MED ORDER — OLANZAPINE 5 MG PO TABS
10.0000 mg | ORAL_TABLET | Freq: Every day | ORAL | Status: DC
  Administered 2017-06-24 – 2017-06-25 (×2): 10 mg via ORAL
  Filled 2017-06-24 (×2): qty 2

## 2017-06-24 MED ORDER — METFORMIN HCL ER 500 MG PO TB24
1000.0000 mg | ORAL_TABLET | Freq: Two times a day (BID) | ORAL | Status: DC
  Administered 2017-06-25 – 2017-06-26 (×3): 1000 mg via ORAL
  Filled 2017-06-24 (×4): qty 2

## 2017-06-24 MED ORDER — NEOSTIGMINE METHYLSULFATE 10 MG/10ML IV SOLN
INTRAVENOUS | Status: AC
  Filled 2017-06-24: qty 1

## 2017-06-24 MED ORDER — ONDANSETRON HCL 4 MG PO TABS: 4.0000 mg | ORAL_TABLET | Freq: Four times a day (QID) | ORAL | Status: DC | PRN

## 2017-06-24 MED ORDER — ONDANSETRON HCL 4 MG/2ML IJ SOLN
4.0000 mg | Freq: Four times a day (QID) | INTRAMUSCULAR | Status: DC | PRN
  Administered 2017-06-24 (×2): 4 mg via INTRAVENOUS

## 2017-06-24 MED ORDER — DEXAMETHASONE SODIUM PHOSPHATE 10 MG/ML IJ SOLN
INTRAMUSCULAR | Status: DC | PRN
  Administered 2017-06-24: 4 mg via INTRAVENOUS

## 2017-06-24 MED ORDER — FENTANYL CITRATE (PF) 100 MCG/2ML IJ SOLN
INTRAMUSCULAR | Status: AC
  Filled 2017-06-24: qty 2

## 2017-06-24 MED ORDER — HYDROMORPHONE HCL 1 MG/ML IJ SOLN
0.2500 mg | INTRAMUSCULAR | Status: DC | PRN
  Administered 2017-06-24 (×2): 0.5 mg via INTRAVENOUS

## 2017-06-24 MED ORDER — PROPOFOL 10 MG/ML IV BOLUS
INTRAVENOUS | Status: AC
  Filled 2017-06-24: qty 20

## 2017-06-24 MED ORDER — EMPAGLIFLOZIN-METFORMIN HCL ER 5-1000 MG PO TB24: 1.0000 | ORAL_TABLET | Freq: Two times a day (BID) | ORAL | Status: DC

## 2017-06-24 MED ORDER — OXYCODONE-ACETAMINOPHEN 5-325 MG PO TABS
1.0000 | ORAL_TABLET | ORAL | Status: DC | PRN
  Administered 2017-06-25 (×3): 2 via ORAL
  Administered 2017-06-25: 1 via ORAL
  Administered 2017-06-26 (×3): 2 via ORAL
  Filled 2017-06-24 (×4): qty 2
  Filled 2017-06-24: qty 1
  Filled 2017-06-24 (×3): qty 2

## 2017-06-24 MED ORDER — KETOROLAC TROMETHAMINE 30 MG/ML IJ SOLN
30.0000 mg | Freq: Four times a day (QID) | INTRAMUSCULAR | Status: DC
  Administered 2017-06-24 – 2017-06-26 (×8): 30 mg via INTRAVENOUS
  Filled 2017-06-24 (×7): qty 1

## 2017-06-24 MED ORDER — DEXAMETHASONE SODIUM PHOSPHATE 4 MG/ML IJ SOLN
INTRAMUSCULAR | Status: AC
  Filled 2017-06-24: qty 1

## 2017-06-24 MED ORDER — SODIUM CHLORIDE 0.9 % IV SOLN
INTRAVENOUS | Status: DC
  Administered 2017-06-24 – 2017-06-25 (×4): via INTRAVENOUS

## 2017-06-24 MED ORDER — LIDOCAINE HCL (PF) 1 % IJ SOLN
INTRAMUSCULAR | Status: AC
  Filled 2017-06-24: qty 5

## 2017-06-24 MED ORDER — 0.9 % SODIUM CHLORIDE (POUR BTL) OPTIME
TOPICAL | Status: DC | PRN
  Administered 2017-06-24: 1000 mL
  Administered 2017-06-24: 2000 mL

## 2017-06-24 MED ORDER — SERTRALINE HCL 50 MG PO TABS
200.0000 mg | ORAL_TABLET | Freq: Every day | ORAL | Status: DC
  Administered 2017-06-24 – 2017-06-25 (×2): 200 mg via ORAL
  Filled 2017-06-24 (×2): qty 4

## 2017-06-24 MED ORDER — ONDANSETRON HCL 4 MG/2ML IJ SOLN
INTRAMUSCULAR | Status: DC | PRN
  Administered 2017-06-24: 4 mg via INTRAVENOUS

## 2017-06-24 MED ORDER — BUPROPION HCL ER (XL) 150 MG PO TB24
150.0000 mg | ORAL_TABLET | Freq: Every day | ORAL | Status: DC
  Administered 2017-06-24 – 2017-06-26 (×3): 150 mg via ORAL
  Filled 2017-06-24 (×3): qty 1

## 2017-06-24 MED ORDER — ALBUTEROL SULFATE HFA 108 (90 BASE) MCG/ACT IN AERS: 2.0000 | INHALATION_SPRAY | Freq: Four times a day (QID) | RESPIRATORY_TRACT | Status: DC | PRN

## 2017-06-24 MED ORDER — OXYCODONE HCL 5 MG PO TABS: 5.0000 mg | ORAL_TABLET | Freq: Once | ORAL | Status: DC | PRN

## 2017-06-24 MED ORDER — LIDOCAINE HCL (CARDIAC) 20 MG/ML IV SOLN
INTRAVENOUS | Status: DC | PRN
  Administered 2017-06-24: 50 mg via INTRAVENOUS

## 2017-06-24 MED ORDER — ALBUTEROL SULFATE HFA 108 (90 BASE) MCG/ACT IN AERS
INHALATION_SPRAY | RESPIRATORY_TRACT | Status: DC | PRN
  Administered 2017-06-24: 5 via RESPIRATORY_TRACT

## 2017-06-24 MED ORDER — SODIUM CHLORIDE 0.9% FLUSH: 9.0000 mL | INTRAVENOUS | Status: DC | PRN

## 2017-06-24 MED ORDER — ACETAMINOPHEN 325 MG PO TABS
650.0000 mg | ORAL_TABLET | Freq: Four times a day (QID) | ORAL | Status: DC | PRN
  Administered 2017-06-24: 650 mg via ORAL
  Filled 2017-06-24: qty 2

## 2017-06-24 MED ORDER — GLYCOPYRROLATE 0.2 MG/ML IJ SOLN
INTRAMUSCULAR | Status: AC
Start: 2017-06-24 — End: 2017-06-24
  Filled 2017-06-24: qty 2

## 2017-06-24 MED ORDER — LACTATED RINGERS IV SOLN
INTRAVENOUS | Status: DC
  Administered 2017-06-24 (×4): via INTRAVENOUS

## 2017-06-24 MED ORDER — KETOROLAC TROMETHAMINE 30 MG/ML IJ SOLN
30.0000 mg | Freq: Four times a day (QID) | INTRAMUSCULAR | Status: DC
  Filled 2017-06-24: qty 1

## 2017-06-24 MED ORDER — DIPHENHYDRAMINE HCL 12.5 MG/5ML PO ELIX
12.5000 mg | ORAL_SOLUTION | Freq: Four times a day (QID) | ORAL | Status: DC | PRN
  Filled 2017-06-24: qty 5

## 2017-06-24 MED ORDER — BUPIVACAINE HCL (PF) 0.5 % IJ SOLN
INTRAMUSCULAR | Status: DC | PRN
  Administered 2017-06-24: 20 mL

## 2017-06-24 MED ORDER — ONDANSETRON HCL 4 MG/2ML IJ SOLN
4.0000 mg | Freq: Four times a day (QID) | INTRAMUSCULAR | Status: DC | PRN
  Filled 2017-06-24 (×2): qty 2

## 2017-06-24 MED ORDER — ONDANSETRON HCL 4 MG/2ML IJ SOLN
INTRAMUSCULAR | Status: AC
  Filled 2017-06-24: qty 2

## 2017-06-24 MED ORDER — HYDROMORPHONE 1 MG/ML IV SOLN
INTRAVENOUS | Status: AC
  Filled 2017-06-24: qty 25

## 2017-06-24 MED ORDER — GLYCOPYRROLATE 0.2 MG/ML IJ SOLN
INTRAMUSCULAR | Status: DC | PRN
  Administered 2017-06-24: .5 mg via INTRAVENOUS

## 2017-06-24 MED ORDER — ALBUTEROL SULFATE (2.5 MG/3ML) 0.083% IN NEBU: 2.5000 mg | INHALATION_SOLUTION | Freq: Four times a day (QID) | RESPIRATORY_TRACT | Status: DC | PRN

## 2017-06-24 MED ORDER — PANTOPRAZOLE SODIUM 40 MG PO TBEC
40.0000 mg | DELAYED_RELEASE_TABLET | Freq: Every day | ORAL | Status: DC
  Administered 2017-06-25 – 2017-06-26 (×2): 40 mg via ORAL
  Filled 2017-06-24 (×2): qty 1

## 2017-06-24 MED ORDER — FENTANYL CITRATE (PF) 250 MCG/5ML IJ SOLN
INTRAMUSCULAR | Status: AC
  Filled 2017-06-24: qty 5

## 2017-06-24 MED ORDER — CEFAZOLIN SODIUM-DEXTROSE 1-4 GM/50ML-% IV SOLN
1.0000 g | Freq: Once | INTRAVENOUS | Status: DC
  Filled 2017-06-24: qty 50

## 2017-06-24 MED ORDER — FENTANYL CITRATE (PF) 100 MCG/2ML IJ SOLN
INTRAMUSCULAR | Status: DC | PRN
  Administered 2017-06-24 (×3): 50 ug via INTRAVENOUS
  Administered 2017-06-24: 100 ug via INTRAVENOUS
  Administered 2017-06-24 (×4): 50 ug via INTRAVENOUS
  Administered 2017-06-24: 100 ug via INTRAVENOUS

## 2017-06-24 MED ORDER — BUPIVACAINE HCL (PF) 0.5 % IJ SOLN
INTRAMUSCULAR | Status: AC
  Filled 2017-06-24: qty 30

## 2017-06-24 MED ORDER — ONDANSETRON 4 MG PO TBDP
4.0000 mg | ORAL_TABLET | Freq: Once | ORAL | Status: AC
  Administered 2017-06-24: 4 mg via ORAL
  Filled 2017-06-24: qty 1

## 2017-06-24 MED ORDER — PROPOFOL 10 MG/ML IV BOLUS
INTRAVENOUS | Status: DC | PRN
  Administered 2017-06-24: 200 mg via INTRAVENOUS

## 2017-06-24 MED ORDER — CEFAZOLIN SODIUM 10 G IJ SOLR: 3.0000 g | INTRAMUSCULAR | Status: DC

## 2017-06-24 MED ORDER — CANAGLIFLOZIN 100 MG PO TABS
100.0000 mg | ORAL_TABLET | Freq: Every day | ORAL | Status: DC
  Administered 2017-06-25 – 2017-06-26 (×2): 100 mg via ORAL
  Filled 2017-06-24 (×4): qty 1

## 2017-06-24 MED ORDER — MIDAZOLAM HCL 2 MG/2ML IJ SOLN
1.0000 mg | Freq: Once | INTRAMUSCULAR | Status: AC | PRN
  Administered 2017-06-24: 2 mg via INTRAVENOUS
  Filled 2017-06-24: qty 2

## 2017-06-24 MED ORDER — SUCCINYLCHOLINE CHLORIDE 20 MG/ML IJ SOLN
INTRAMUSCULAR | Status: DC | PRN
  Administered 2017-06-24: 120 mg via INTRAVENOUS

## 2017-06-24 SURGICAL SUPPLY — 49 items
BAG HAMPER (MISCELLANEOUS) ×4 IMPLANT
BENZOIN TINCTURE PRP APPL 2/3 (GAUZE/BANDAGES/DRESSINGS) ×8 IMPLANT
BLADE SURG SZ10 CARB STEEL (BLADE) ×4 IMPLANT
CLOSURE WOUND 1/2 X4 (GAUZE/BANDAGES/DRESSINGS) ×3
CLOTH BEACON ORANGE TIMEOUT ST (SAFETY) ×4 IMPLANT
COVER LIGHT HANDLE STERIS (MISCELLANEOUS) ×8 IMPLANT
DECANTER SPIKE VIAL GLASS SM (MISCELLANEOUS) ×4 IMPLANT
DRAPE WARM FLUID 44X44 (DRAPE) ×4 IMPLANT
DRSG OPSITE POSTOP 4X10 (GAUZE/BANDAGES/DRESSINGS) ×8 IMPLANT
DRSG OPSITE POSTOP 4X8 (GAUZE/BANDAGES/DRESSINGS) ×4 IMPLANT
DURAPREP 26ML APPLICATOR (WOUND CARE) ×4 IMPLANT
ELECT REM PT RETURN 9FT ADLT (ELECTROSURGICAL) ×4
ELECTRODE REM PT RTRN 9FT ADLT (ELECTROSURGICAL) ×2 IMPLANT
EVACUATOR DRAINAGE 10X20 100CC (DRAIN) ×4 IMPLANT
EVACUATOR SILICONE 100CC (DRAIN) ×4
FORMALIN 10 PREFIL 480ML (MISCELLANEOUS) ×4 IMPLANT
GAUZE SPONGE 4X4 12PLY STRL (GAUZE/BANDAGES/DRESSINGS) ×4 IMPLANT
GLOVE BIOGEL PI IND STRL 7.0 (GLOVE) ×2 IMPLANT
GLOVE BIOGEL PI IND STRL 9 (GLOVE) ×2 IMPLANT
GLOVE BIOGEL PI INDICATOR 7.0 (GLOVE) ×2
GLOVE BIOGEL PI INDICATOR 9 (GLOVE) ×2
GLOVE ECLIPSE 7.0 STRL STRAW (GLOVE) ×4 IMPLANT
GLOVE ECLIPSE 9.0 STRL (GLOVE) ×4 IMPLANT
GLOVE SURG SS PI 6.5 STRL IVOR (GLOVE) ×4 IMPLANT
GOWN SPEC L3 XXLG W/TWL (GOWN DISPOSABLE) ×4 IMPLANT
GOWN STRL REUS W/TWL LRG LVL3 (GOWN DISPOSABLE) ×8 IMPLANT
INST SET MAJOR GENERAL (KITS) ×4 IMPLANT
KIT ROOM TURNOVER APOR (KITS) ×4 IMPLANT
MANIFOLD NEPTUNE II (INSTRUMENTS) ×4 IMPLANT
NEEDLE HYPO 25X1 1.5 SAFETY (NEEDLE) ×4 IMPLANT
NS IRRIG 1000ML POUR BTL (IV SOLUTION) ×8 IMPLANT
PACK ABDOMINAL MAJOR (CUSTOM PROCEDURE TRAY) ×4 IMPLANT
PAD ARMBOARD 7.5X6 YLW CONV (MISCELLANEOUS) ×4 IMPLANT
RETRACTOR WND ALEXIS 25 LRG (MISCELLANEOUS) ×2 IMPLANT
RTRCTR WOUND ALEXIS 25CM LRG (MISCELLANEOUS) ×4
SET BASIN LINEN APH (SET/KITS/TRAYS/PACK) ×4 IMPLANT
SPONGE DRAIN TRACH 4X4 STRL 2S (GAUZE/BANDAGES/DRESSINGS) ×8 IMPLANT
SPONGE LAP 18X18 X RAY DECT (DISPOSABLE) IMPLANT
STRIP CLOSURE SKIN 1/2X4 (GAUZE/BANDAGES/DRESSINGS) ×9 IMPLANT
SUT CHROMIC 0 CT 1 (SUTURE) ×48 IMPLANT
SUT CHROMIC 2 0 CT 1 (SUTURE) ×4 IMPLANT
SUT ETHILON 3 0 FSL (SUTURE) ×4 IMPLANT
SUT PDS AB CT VIOLET #0 27IN (SUTURE) ×8 IMPLANT
SUT PLAIN 2 0 XLH (SUTURE) ×16 IMPLANT
SUT VICRYL 4 0 KS 27 (SUTURE) ×8 IMPLANT
SYR BULB IRRIGATION 50ML (SYRINGE) ×4 IMPLANT
SYR CONTROL 10ML LL (SYRINGE) ×4 IMPLANT
TOWEL BLUE STERILE X RAY DET (MISCELLANEOUS) ×4 IMPLANT
TRAY FOLEY W/METER SILVER 16FR (SET/KITS/TRAYS/PACK) ×4 IMPLANT

## 2017-06-24 NOTE — H&P (Signed)
Tilda BurrowFerguson, Lisaanne Lawrie V, MD  Obstetrics  Expand All Collapse All   [] Hide copied text [] Hover for attribution information Patient ID: Heidi MyrtleJessica Jeter, female   DOB: 01/20/1991, 26 y.o.   MRN: [id number] Preoperative History and Physical  Heidi [name] is a 26 y.o. G2P0011 here for surgical management of pelvic pain associated with Essure IUD. No significant preoperative concerns. Pt would like her Essure coils returned to her or her lawyer. She has lost a small amount of weight since her last visit. Pt denies any other sx or complaints at this time.   Proposed surgery: Supracervical Hysterectomy with bilateral salpingectomy, with tubes and ovaries to very removed intact with the uterus due to the controversy of the Essure System      Past Medical History:  Diagnosis Date  . Abdominal pain 01/10/2016  . Anxiety   . Back pain   . Bloating 01/10/2016  . Depression   . GERD (gastroesophageal reflux disease)   . Hematuria 01/10/2016  . Herpes simplex virus (HSV) infection   . Hypertension   . Osteoarthritis   . Prediabetes   . Schizo-affective schizophrenia (HCC)   . Type 2 diabetes mellitus (HCC) 03/28/2016  . Urinary frequency 01/10/2016        Past Surgical History:  Procedure Laterality Date  . CESAREAN SECTION    . COLONOSCOPY WITH PROPOFOL N/A 03/17/2017   Procedure: COLONOSCOPY WITH PROPOFOL;  Surgeon: Corbin Adeourk, Robert M, MD;  Location: [facility];  Service: Endoscopy;  Laterality: N/A;  9:30am  . DILATION AND CURETTAGE OF UTERUS    . ESOPHAGOGASTRODUODENOSCOPY (EGD) WITH PROPOFOL N/A 03/17/2017   Procedure: ESOPHAGOGASTRODUODENOSCOPY (EGD) WITH PROPOFOL;  Surgeon: Corbin Adeourk, Robert M, MD;  Location: [facility];  Service: Endoscopy;  Laterality: N/A;  . ESSURE TUBAL LIGATION                     OB History  Gravida Para Term Preterm AB Living  2 1     1 1   SAB TAB Ectopic Multiple Live Births  1       1    # Outcome Date GA Lbr Len/2nd Weight Sex Delivery Anes  PTL Lv  2 SAB           1 Para     M    LIV    Patient denies any other pertinent gynecologic issues.         Current Outpatient Prescriptions on File Prior to Visit  Medication Sig Dispense Refill  . albuterol (PROVENTIL HFA;VENTOLIN HFA) 108 (90 Base) MCG/ACT inhaler Inhale 2 puffs into the lungs every 6 (six) hours as needed for wheezing or shortness of breath. 1 Inhaler 2  . ALPRAZolam (XANAX) 0.25 MG tablet Take 1 tablet (0.25 mg total) by mouth 2 (two) times daily as needed for anxiety. 10 tablet 2  . amLODipine (NORVASC) 5 MG tablet Take 1 tablet (5 mg total) by mouth daily. (Patient taking differently: Take 5 mg by mouth at bedtime. ) 90 tablet 1  . cholecalciferol (VITAMIN D) 1000 units tablet Take 1,000 Units by mouth at bedtime.     . cyclobenzaprine (FLEXERIL) 10 MG tablet TAKE 1 TABLET BY MOUTH THREE TIMES DAILY AS NEEDED FOR MUSCLE SPASM 30 tablet 2  . dexlansoprazole (DEXILANT) 60 MG capsule Take 1 capsule (60 mg total) by mouth daily. 30 capsule 11  . empagliflozin (JARDIANCE) 10 MG TABS tablet Take 10 mg by mouth daily. 30 tablet 3  . Empagliflozin-Metformin HCl ER (  SYNJARDY XR) 12-998 MG TB24 Take 1 tablet by mouth 2 (two) times daily. 60 tablet 2  . glucose blood (ACCU-CHEK GUIDE) test strip Use to check BG once daily at varying times of day. 100 each 3  . Lancets 30G MISC Dispense Accu-Chek guide lancets.  Check blood sugar at varying times once per day. 100 each 11  . lidocaine (XYLOCAINE) 2 % jelly 1 application by Other route as needed. Apply to the rectal area once per day for hemorrhoid discomfort 30 mL 0  . lisinopril (PRINIVIL,ZESTRIL) 20 MG tablet TAKE ONE TABLET BY MOUTH ONCE DAILY 30 tablet 5  . meloxicam (MOBIC) 7.5 MG tablet TAKE 2 TABLETS BY MOUTH ONCE DAILY 60 tablet 2  . metFORMIN (GLUCOPHAGE) 1000 MG tablet Take 1 tablet (1,000 mg total) by mouth 2 (two) times daily with a meal. 60 tablet 3  . NONFORMULARY OR COMPOUNDED ITEM Place 1  application around the anus 4 (four) times daily.    Marland Kitchen OLANZapine (ZYPREXA) 10 MG tablet TAKE 1 TABLET BY MOUTH AT BEDTIME 90 tablet 0  . sertraline (ZOLOFT) 100 MG tablet Take 2 tablets (200 mg total) by mouth at bedtime. 180 tablet 2  . traZODone (DESYREL) 50 MG tablet Take 1 tablet (50 mg total) by mouth at bedtime as needed for sleep. 30 tablet 2  . triamcinolone cream (KENALOG) 0.1 % Apply 1 application topically 2 (two) times daily as needed (for rash/irritated skin.).     No current facility-administered medications on file prior to visit.         Allergies  Allergen Reactions  . Hydroxyzine     Causes her more anxiety.  [name] [Linaclotide] Hives  . Lorazepam     Sedation  . Morphine And Related Other (See Comments)    Makes pt.aggressive  . Quetiapine     Sweating, irritability    Social History:   reports that she has been smoking Cigarettes.  She has a 10.00 pack-year smoking history. She has never used smokeless tobacco. She reports that she drinks alcohol. She reports that she uses drugs, including Marijuana.       Family History  Problem Relation Age of Onset  . Depression Mother   . Hypertension Mother   . Diabetes Mother   . Mental illness Father   . Hypertension Father   . Diabetes Father   . Cancer Maternal Aunt   . Diabetes Paternal Grandmother   . Hypertension Paternal Grandmother   . Arthritis Paternal Grandmother   . Stroke Paternal Grandfather   . Heart disease Paternal Grandfather   . Hypertension Paternal Grandfather   . Diabetes Paternal Grandfather   . Schizophrenia Maternal Grandfather   . Alcohol abuse Maternal Grandfather   . Other Sister        recovering addict  . Schizophrenia Sister   . Irritable bowel syndrome Sister   . Other Sister        recovering addict  . Bipolar disorder Sister   . Seizures Sister   . Colon cancer Neg Hx   . Inflammatory bowel disease Neg Hx   . Celiac  disease Neg Hx     Review of Systems: Noncontributory  PHYSICAL EXAM: Blood pressure 140/70, pulse 78, weight (!) 301 lb 12.8 oz (136.9 kg), last menstrual period 04/30/2017. General appearance - alert, well appearing, and in no distress Chest - clear to auscultation, no wheezes, rales or rhonchi, symmetric air entry Heart - normal rate and regular rhythm Abdomen - soft,  nontender, nondistended, no masses or organomegaly Pelvic - PAP Smear done External genitalia: normal,  Vagina: good support Cervix: requires large, longer speculum to visualize, smooth, PAP done Uterus: Non mobile, difficult to feel Extremities - peripheral pulses normal, no pedal edema, no clubbing or cyanosis        Labs: CBC    Component Value Date/Time   WBC 12.0 (H) 06/20/2017 1357   RBC 4.87 06/20/2017 1357   HGB 10.5 (L) 06/20/2017 1357   HGB 13.2 11/11/2016 1054   HCT 35.6 (L) 06/20/2017 1357   HCT 39.8 11/11/2016 1054   PLT 231 06/20/2017 1357   PLT 247 11/11/2016 1054   MCV 73.1 (L) 06/20/2017 1357   MCV 92 11/11/2016 1054   MCH 21.6 (L) 06/20/2017 1357   MCHC 29.5 (L) 06/20/2017 1357   RDW 18.1 (H) 06/20/2017 1357   RDW 13.5 11/11/2016 1054   LYMPHSABS 2.4 06/20/2017 1357   LYMPHSABS 2.5 11/11/2016 1054   MONOABS 0.5 06/20/2017 1357   EOSABS 0.1 06/20/2017 1357   EOSABS 0.3 11/11/2016 1054   BASOSABS 0.1 06/20/2017 1357   BASOSABS 0.1 11/11/2016 1054   BMET    Component Value Date/Time   NA 139 06/20/2017 1357   NA 136 04/24/2017 1401   K 3.7 06/20/2017 1357   CL 106 06/20/2017 1357   CO2 21 (L) 06/20/2017 1357   GLUCOSE 143 (H) 06/20/2017 1357   BUN 8 06/20/2017 1357   BUN 6 04/24/2017 1401   CREATININE 0.66 06/20/2017 1357   CALCIUM 9.5 06/20/2017 1357   GFRNONAA >60 06/20/2017 1357   GFRAA >60 06/20/2017 1357      Imaging Studies: ImagingResults  No results found.    Assessment:     Patient Active Problem List   Diagnosis Date Noted  . Anal  fissure 05/06/2017  . 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 Morbid obesity (HCC) 04/15/2017  . Pelvic pain in female 04/15/2017  . GERD (gastroesophageal reflux disease) 03/13/2017  . Abnormal liver function tests 03/13/2017  . Abdominal pain, epigastric 03/13/2017  . Rectal bleed 01/28/2017  . Type 2 diabetes mellitus (HCC) 03/28/2016  . Paresthesia 01/18/2016  . Neuropathic pain 11/10/2015  . Essential hypertension, benign 07/21/2015  . Anxiety and depression 06/22/2015    Plan: 1. Patient will undergo surgical management with supracervical hysterectomy  On 10/23. .May need to consider partial removal of the redundant pannus to allow pelvic access for the surgery 2. Patient has brought in a request list she specifically wants to make sure that the abdomen is x-ray prior to the procedure to document the presence of the Essure, and she wants a postprocedure x-ray to document the removal of the posterior completely, as guided by her lawyer. A copy of the pre-op list, will be sent with pathology specimen 2. F/u post op 4 weeks  . 05/28/2017 5:11 PM  By signing my name below, I, Diona Browner, attest that this documentation has been prepared under the direction and in the presence of Tilda Burrow, MD. Electronically Signed: Diona Browner, Medical Scribe. 05/28/17. 5:11 PM.  I personally performed the services described in this documentation, which was SCRIBED in my presence. The recorded information has been reviewed and considered accurate. It has been edited as necessary during review. Tilda Burrow, MD        Reviewed, updated and signed 06/24/17 Tilda Burrow, MD

## 2017-06-24 NOTE — Addendum Note (Signed)
Addendum  created 06/24/17 1320 by Jeani HawkingBerry, Christophor Eick J, CRNA   Anesthesia Intra Flowsheets edited

## 2017-06-24 NOTE — Anesthesia Postprocedure Evaluation (Signed)
Anesthesia Post Note  Patient: Estrella MyrtleJessica Pastrana  Procedure(s) Performed: SUPRACERVICAL ABDOMINAL HYSTERECTOMY, WIDE EXCISION OF CICATRIX (N/A ) BILATERAL SALPINGECTOMY (Bilateral )  Patient location during evaluation: PACU Anesthesia Type: General Level of consciousness: awake and alert, oriented and patient cooperative Pain management: pain level controlled Vital Signs Assessment: post-procedure vital signs reviewed and stable Respiratory status: spontaneous breathing, respiratory function stable and patient connected to nasal cannula oxygen Cardiovascular status: blood pressure returned to baseline and stable Postop Assessment: no headache, adequate PO intake, no apparent nausea or vomiting, no backache and patient able to bend at knees Anesthetic complications: no     Last Vitals:  Vitals:   06/24/17 0935 06/24/17 0940  BP: (!) 90/59 (!) 95/56  Pulse:    Resp: 16 17  Temp:    SpO2: 99% 98%    Last Pain:  Vitals:   06/24/17 0824  TempSrc: Oral  PainSc: 5                  Estle Huguley

## 2017-06-24 NOTE — Brief Op Note (Signed)
06/24/2017  12:44 PM  PATIENT:  Heidi [name]  26 y.o. female  PRE-OPERATIVE DIAGNOSIS:  Pelvic Pain, Removal of Essure Sterilization System  POST-OPERATIVE DIAGNOSIS:  Pelvic Pain, Removal of Essure Sterilization System  PROCEDURE:  Procedure(s): SUPRACERVICAL ABDOMINAL HYSTERECTOMY, WIDE EXCISION OF CICATRIX (N/A) BILATERAL SALPINGECTOMY (Bilateral)  SURGEON:  Surgeon(s) and Role:    Tilda Burrow, MD - Primary  PHYSICIAN ASSISTANT:   ASSISTANTS: Cecile Sheerer RNFA   ANESTHESIA:   local and general  EBL:  150 mL   BLOOD ADMINISTERED:none  DRAINS: (2) Jackson-Pratt drain(s) with closed bulb suction in the ubcutaneous space and Urinary Catheter (Foley)   LOCAL MEDICATIONS USED:  MARCAINE    and Amount: 30 ml  SPECIMEN:  Source of Specimen:  abdominal wall cicatrix, with adjacent fat, uterus with intact fallopian tubes, supracervical  DISPOSITION OF SPECIMEN:  PATHOLOGYpathology: Please review patient instruction sheet in media section of record  COUNTS:  YES  TOURNIQUET:  * No tourniquets in log *  DICTATION: .Dragon Dictation  PLAN OF CARE: Admit to inpatient   PATIENT DISPOSITION:  PACU - hemodynamically stable.   Delay start of Pharmacological VTE agent (>24hrs) due to surgical blood loss or risk of bleeding: not applicable Findings:large abdominal pannus that would've interfered with access to the pelvis, thus resulting in wide excision of cicatrix removing a large pannus 2. Omental adhesions to the anterior abdominal wall, sequelae of the old C-section 3 absolutely normal appearing uterine fundus with normal-appearing ovaries, corpus lutein cyst on the right ovary. The fallopian tubes were intact. The is your device could be palpated in the cornual area of the fallopian tubes extending 1-2 cm past the cornu into the tube. Perforation of the tube and no suspicion of abnormality and the rest of the uterus Details of procedure: Patient was taken operating room  prepped and draped for lower abdominal surgery. Answers lower abdominal incision was repeated beginning at the site of the old C-section scar and extending it laterally to the anterior superior iliac crest area on each side, weight 40 cm long incision with sharp dissection two thirds the way down to the fascia, and a thick layer of skin and fatty tissue removed approximately 40 cm in length by 12 cm in maximum width. This ellipse of skin and fatty tissue was removed to allow for improved access. A Pelosi type incision was then performed, and midline vertical incision into the abdominal cavity. We encountered a large amount of dense omental adhesions to the anterior abdominal wall which could be taken down and fortunately with the Pelosi incision type. Was not difficult. The omental 8 attachments were hemostatic.The bowel away was then attempted. We placed the Alexis large wound retractor in position and then tried to pack the bowel away. It took 3 laparotomy tapes and 2 moistened radiographic towels rolled up to keep the omentum and fat and bowel away from the uterus. Findings were of a completely normal-appearing uterine fundus no inflammation in the area around the tubes, no foreign body in the pelvis, normal ovaries bilaterally which included a corpus lutein cyst on the right ovary.  First we carefully erased the left fallopian tube and made a window in the mesosalpinx such that a Kelley clamp to be placed beneath the right fallopian tubes distal half and amputate the distal fallopian tube attachments and ligate the lateral portions of the broad ligament. The remainder of the fallopian tube could then be sharply dissected off of the proximal mesosalpinx. Palpation proved that the  tube containing the is here device upon reaching the coronary was possible to clamp across the rather floppy left utero-ovarian ligament and round ligament in such a way that the ligated together and ensure that the support lateral  sidewall to theovary was good. Performed. The was developed on the anterior lower uterine segment, and the uterine vessels were skeletonized. A curved Heaney clamp was placed across these vessels which were then clamped cut and suture ligated. The bag bleeding on the side was controlled with a Kelley clamp and was ligated separately.  Attention was then directed to the patient's right side where again the fallopian tube was palpated and the Essure device confirmed as being inside the fallopian tube, the mesosalpinx clamped cut and suture ligated keeping the fallopian tube intact area and we've made particular care throughout the case to avoid traction and countertraction around the tube or the Essure device. Uterine vessels on the right side were similarly clamped cut and suture ligated and the cardinal ligaments clamped with straight Heaney clamp knife transection performed and 0 chromic suture ligature. Bladder flap had been mobilized and the lower uterine segment conization patient of the uterus and lower uterine segment off of the cervix then performed. Care was taken to perform this in a way that did not disrupt the fallopian tube. The cuff was then oversewn with a series of interrupted 0 chromic sutures and hemostasis confirmed. Pelvis was copiously irrigated. The hemostasiswas confirmed. Anterior peritoneum was closed with continuous running is 2-0 Vicryl, the subcutaneous fatty tissues copiously irrigated and then closed with a series of horizontal mattress sutures of 20 plain followed bysubcuticular 4-0 Vicryl closure the skin incisions. Sponge and needle counts were correct procedure was tolerated well the patient. Can Marcaine in the fascia and the subcutaneous skin adjacent to the abdominal incision, 20+ cc. Sponge and needle counts were correct 2 JP drains were placed in the depths of the fascial incision and allowed to exit through the suprapubic skin these will be left in place 1 week and patient  will be instructed in their drainage care

## 2017-06-24 NOTE — Addendum Note (Signed)
Addendum  created 06/24/17 1242 by Carlyle BasquesSchultz, John R, MD   Order list changed, Order sets accessed

## 2017-06-24 NOTE — Anesthesia Preprocedure Evaluation (Addendum)
Anesthesia Evaluation  Patient identified by MRN, date of birth, ID band Patient awake    Airway Mallampati: I  TM Distance: >3 FB     Dental  (+) Poor Dentition,    Pulmonary Current Smoker,    Pulmonary exam normal breath sounds clear to auscultation       Cardiovascular hypertension, Pt. on medications  Rhythm:Regular Rate:Normal     Neuro/Psych PSYCHIATRIC DISORDERS Anxiety Depression Schizophrenia    GI/Hepatic GERD  Medicated and Controlled,  Endo/Other  diabetes, Well Controlled, Type 2  Renal/GU      Musculoskeletal  (+) Arthritis , Osteoarthritis,    Abdominal Normal abdominal exam  (+) + obese,  Abdomen: soft.    Peds  Hematology   Anesthesia Other Findings Results for Heidi [name], Heidi [name] (MRN [id number]) as of 06/24/2017 09:18  06/20/2017 13:57 Preg, Serum: NEGATIVE   Reproductive/Obstetrics                            Anesthesia Physical Anesthesia Plan  ASA: III  Anesthesia Plan: General   Post-op Pain Management:    Induction: Intravenous  PONV Risk Score and Plan: Ondansetron, Midazolam and Dexamethasone  Airway Management Planned: Oral ETT  Additional Equipment:   Intra-op Plan:   Post-operative Plan:   Informed Consent: I have reviewed the patients History and Physical, chart, labs and discussed the procedure including the risks, benefits and alternatives for the proposed anesthesia with the patient or authorized representative who has indicated his/her understanding and acceptance.   Dental advisory given  Plan Discussed with: CRNA  Anesthesia Plan Comments:         Anesthesia Quick Evaluation

## 2017-06-24 NOTE — Transfer of Care (Signed)
Immediate Anesthesia Transfer of Care Note  Patient: Heidi [name]  Procedure(s) Performed: SUPRACERVICAL ABDOMINAL HYSTERECTOMY, WIDE EXCISION OF CICATRIX (N/A ) BILATERAL SALPINGECTOMY (Bilateral )  Patient Location: PACU  Anesthesia Type:General  Level of Consciousness: awake, alert , oriented and patient cooperative  Airway & Oxygen Therapy: Patient Spontanous Breathing and Patient connected to nasal cannula oxygen  Post-op Assessment: Report given to RN and Post -op Vital signs reviewed and stable  Post vital signs: Reviewed and stable  Last Vitals:  Vitals:   06/24/17 0935 06/24/17 0940  BP: (!) 90/59 (!) 95/56  Pulse:    Resp: 16 17  Temp:    SpO2: 99% 98%    Last Pain:  Vitals:   06/24/17 0824  TempSrc: Oral  PainSc: 5       Patients Stated Pain Goal: 9 (06/24/17 0824)  Complications: No apparent anesthesia complications

## 2017-06-24 NOTE — Op Note (Signed)
Please see the brief operative note for surgical details 

## 2017-06-25 ENCOUNTER — Encounter (HOSPITAL_COMMUNITY): Payer: Self-pay | Admitting: Obstetrics and Gynecology

## 2017-06-25 LAB — BASIC METABOLIC PANEL
ANION GAP: 8 (ref 5–15)
BUN: 8 mg/dL (ref 6–20)
CALCIUM: 8.2 mg/dL — AB (ref 8.9–10.3)
CO2: 24 mmol/L (ref 22–32)
Chloride: 103 mmol/L (ref 101–111)
Creatinine, Ser: 0.69 mg/dL (ref 0.44–1.00)
GFR calc Af Amer: >60 mL/min (ref >60)
GLUCOSE: 143 mg/dL — AB (ref 65–99)
Potassium: 4.1 mmol/L (ref 3.5–5.1)
Sodium: 135 mmol/L (ref 135–145)

## 2017-06-25 LAB — GLUCOSE, CAPILLARY
GLUCOSE-CAPILLARY: 126 mg/dL — AB (ref 65–99)
GLUCOSE-CAPILLARY: 206 mg/dL — AB (ref 65–99)
GLUCOSE-CAPILLARY: 169 mg/dL — AB (ref 65–99)
Glucose-Capillary: 162 mg/dL — ABNORMAL HIGH (ref 65–99)

## 2017-06-25 LAB — CBC
HEMATOCRIT: 31.8 % — AB (ref 36.0–46.0)
Hemoglobin: 9.2 g/dL — ABNORMAL LOW (ref 12.0–15.0)
MCH: 21.6 pg — AB (ref 26.0–34.0)
MCHC: 28.9 g/dL — ABNORMAL LOW (ref 30.0–36.0)
MCV: 74.6 fL — AB (ref 78.0–100.0)
PLATELETS: 261 10*3/uL (ref 150–400)
RBC: 4.26 MIL/uL (ref 3.87–5.11)
RDW: 18.8 % — AB (ref 11.5–15.5)
WBC: 17.9 10*3/uL — ABNORMAL HIGH (ref 4.0–10.5)

## 2017-06-25 LAB — TYPE AND SCREEN
ABO/RH(D): A POS
Antibody Screen: NEGATIVE

## 2017-06-25 MED ORDER — CIPROFLOXACIN HCL 250 MG PO TABS
500.0000 mg | ORAL_TABLET | Freq: Two times a day (BID) | ORAL | Status: DC
  Administered 2017-06-25 – 2017-06-26 (×3): 500 mg via ORAL
  Filled 2017-06-25 (×3): qty 2

## 2017-06-25 MED ORDER — ALPRAZOLAM 0.25 MG PO TABS
0.2500 mg | ORAL_TABLET | Freq: Two times a day (BID) | ORAL | Status: DC | PRN
  Administered 2017-06-25 – 2017-06-26 (×2): 0.25 mg via ORAL
  Filled 2017-06-25 (×2): qty 1

## 2017-06-25 MED ORDER — LIVING WELL WITH DIABETES BOOK
Freq: Once | Status: AC
  Administered 2017-06-25: 11:00:00
  Filled 2017-06-25: qty 1

## 2017-06-25 NOTE — Progress Notes (Signed)
Inpatient Diabetes Program Recommendations  AACE/ADA: New Consensus Statement on Inpatient Glycemic Control (2015)  Target Ranges:  Prepandial:   less than 140 mg/dL      Peak postprandial:   less than 180 mg/dL (1-2 hours)     Critically ill patients:  140 - 180 mg/dL  / Lab Results  Component Value Date   GLUCAP 162 (H) 06/25/2017   HGBA1C 9.3 (H) 06/20/2017    Spoke with patient about diabetes and home regimen for diabetes control. Patient reports that she is followed by her PCP for DM management. Patient reports that they taking insulin as prescribed and that she last saw her 1 month ago. Patient states that she checked her glucose once a week. Discussed importance of checking glucose levels to see changes in glucose trends.  Discussed A1C results (9.3% on 06/20/17). Discussed glucose and A1C goals.  Stressed to the patient the importance of improving glycemic control to prevent further complications from uncontrolled diabetes. Patient reports changing her diet after she learned her A1c level. PAtient reports not going to DM education in the past but patient reports she does not have reliaqble transportation. Discussed impact of nutrition, exercise, stress, sickness, and medications on diabetes control.Will order living well with diabetes book and attach nutritional information to discharge paperwork. Patient verbalized understanding of information discussed and he states that he has no further questions at this time related to diabetes.   Thanks,  Christena DeemShannon Loran Fleet RN, MSN, [facility] Inpatient Diabetes Coordinator Team Pager [phone] (8a-5p)

## 2017-06-25 NOTE — Progress Notes (Addendum)
1 Day Post-Op Procedure(s) (LRB): SUPRACERVICAL ABDOMINAL HYSTERECTOMY, WIDE EXCISION OF CICATRIX (N/A) BILATERAL SALPINGECTOMY (Bilateral)  Subjective: Patient reports incisional pain and tolerating PO.  Rates pain a 9/10 but is sleeping at my arrival, easily arousable, and able to converse casually.  Objective: I have reviewed patient's vital signs, intake and output, medications and labs. CBG (last 3) BP (!) 118/58 (BP Location: Left Arm)   Pulse 80   Temp 98.6 F (37 C) (Oral)   Resp 15   Ht 5\' 8"  (1.727 m)   Wt (!) 302 lb (137 kg)   LMP 05/28/2017   SpO2 98%   BMI 45.92 kg/m    Recent Labs  06/24/17 1229 06/24/17 1646 06/24/17 2128  GLUCAP 197* 228* 160*  on SSI and to resume CHO modified diet and pre-admission meds today. CBC Latest Ref Rng & Units 06/25/2017 06/20/2017 03/14/2017  WBC 4.0 - 10.5 K/uL 17.9(H) 12.0(H) 10.5  Hemoglobin 12.0 - 15.0 g/dL 4.4(W9.2(L) 10.5(L) 12.1  Hematocrit 36.0 - 46.0 % 31.8(L) 35.6(L) 35.9(L)  Platelets 150 - 400 K/uL 261 231 219   CMP Latest Ref Rng & Units 06/25/2017 06/20/2017 04/24/2017  Glucose 65 - 99 mg/dL 102(V143(H) 253(G143(H) 644(I306(H)  BUN 6 - 20 mg/dL 8 8 6   Creatinine 0.44 - 1.00 mg/dL 3.470.69 4.250.66 9.560.73  Sodium 135 - 145 mmol/L 135 139 136  Potassium 3.5 - 5.1 mmol/L 4.1 3.7 4.3  Chloride 101 - 111 mmol/L 103 106 98  CO2 22 - 32 mmol/L 24 21(L) 22  Calcium 8.9 - 10.3 mg/dL 8.2(L) 9.5 8.9  Total Protein 6.5 - 8.1 g/dL - 7.3 6.4  Total Bilirubin 0.3 - 1.2 mg/dL - 0.3 0.3  Alkaline Phos 38 - 126 U/L - 124 155(H)  AST 15 - 41 U/L - 21 38  ALT 14 - 54 U/L - 29 33(H)     General: alert, cooperative and no distress Resp: unlabored breathing,  GI: soft, non-tender; bowel sounds normal; no masses,  no organomegaly and incision: clean, dry and intact Extremities: nontender Abdomen Clear fluid in JP drains, appropriate amounts.  Assessment: s/p Procedure(s): SUPRACERVICAL ABDOMINAL HYSTERECTOMY, WIDE EXCISION OF CICATRIX (N/A) BILATERAL  SALPINGECTOMY (Bilateral): stable and progressing well  Type 2 DM, resuming meds, may need increasing meds as A1C was  9.3 preop Plan: Advance diet Advance to PO medication d/c foley  Due to size of incision and leukocytosis will add Cipro as precaution.  LOS: 1 day    Jara Feider V 06/25/2017, 7:11 AM

## 2017-06-25 NOTE — Anesthesia Postprocedure Evaluation (Signed)
Anesthesia Post Note  Patient: Estrella MyrtleJessica Michelli  Procedure(s) Performed: SUPRACERVICAL ABDOMINAL HYSTERECTOMY, WIDE EXCISION OF CICATRIX (N/A ) BILATERAL SALPINGECTOMY (Bilateral )  Patient location during evaluation: Nursing Unit Anesthesia Type: General Level of consciousness: awake and alert and patient cooperative Pain management: pain level controlled Vital Signs Assessment: post-procedure vital signs reviewed and stable Respiratory status: spontaneous breathing, nonlabored ventilation and respiratory function stable Cardiovascular status: blood pressure returned to baseline Postop Assessment: no apparent nausea or vomiting Anesthetic complications: no     Last Vitals:  Vitals:   06/25/17 0600 06/25/17 0855  BP: (!) 118/58   Pulse: 80   Resp: 15 20  Temp: 37 C   SpO2: 98% 97%    Last Pain:  Vitals:   06/25/17 0855  TempSrc:   PainSc: 9                  Daunte Oestreich J

## 2017-06-25 NOTE — Addendum Note (Signed)
Addendum  created 06/25/17 0933 by Despina HiddenIdacavage, Gusta Marksberry J, CRNA   Sign clinical note

## 2017-06-26 LAB — CBC WITH DIFFERENTIAL/PLATELET
BASOS PCT: 0 %
Basophils Absolute: 0 10*3/uL (ref 0.0–0.1)
EOS ABS: 0.2 10*3/uL (ref 0.0–0.7)
EOS PCT: 2 %
HCT: 29.7 % — ABNORMAL LOW (ref 36.0–46.0)
Hemoglobin: 8.6 g/dL — ABNORMAL LOW (ref 12.0–15.0)
Lymphocytes Relative: 31 %
Lymphs Abs: 3.2 10*3/uL (ref 0.7–4.0)
MCH: 21.9 pg — AB (ref 26.0–34.0)
MCHC: 29 g/dL — AB (ref 30.0–36.0)
MCV: 75.6 fL — ABNORMAL LOW (ref 78.0–100.0)
MONO ABS: 0.7 10*3/uL (ref 0.1–1.0)
MONOS PCT: 6 %
NEUTROS PCT: 61 %
Neutro Abs: 6.2 10*3/uL (ref 1.7–7.7)
PLATELETS: 230 10*3/uL (ref 150–400)
RBC: 3.93 MIL/uL (ref 3.87–5.11)
RDW: 18.8 % — AB (ref 11.5–15.5)
WBC: 10.3 10*3/uL (ref 4.0–10.5)

## 2017-06-26 LAB — GLUCOSE, CAPILLARY
GLUCOSE-CAPILLARY: 160 mg/dL — AB (ref 65–99)
GLUCOSE-CAPILLARY: 150 mg/dL — AB (ref 65–99)

## 2017-06-26 MED ORDER — IBUPROFEN 600 MG PO TABS: 600.0000 mg | ORAL_TABLET | Freq: Four times a day (QID) | ORAL | 0 refills | Status: DC | PRN

## 2017-06-26 MED ORDER — DOCUSATE SODIUM 100 MG PO CAPS: 100.0000 mg | ORAL_CAPSULE | Freq: Two times a day (BID) | ORAL | 2 refills | Status: DC | PRN

## 2017-06-26 MED ORDER — OXYCODONE-ACETAMINOPHEN 5-325 MG PO TABS: 1.0000 | ORAL_TABLET | ORAL | 0 refills | Status: DC | PRN

## 2017-06-26 NOTE — Progress Notes (Signed)
2 Days Post-Op Procedure(s) (LRB): SUPRACERVICAL ABDOMINAL HYSTERECTOMY, WIDE EXCISION OF CICATRIX (N/A) BILATERAL SALPINGECTOMY (Bilateral)  Subjective: Patient reports incisional pain, tolerating PO, + flatus and no problems voiding.    Objective: I have reviewed patient's vital signs, medications and labs. CBC Latest Ref Rng & Units 06/26/2017 06/25/2017 06/20/2017  WBC 4.0 - 10.5 K/uL 10.3 17.9(H) 12.0(H)  Hemoglobin 12.0 - 15.0 g/dL 1.6(X8.6(L) 0.9(U9.2(L) 10.5(L)  Hematocrit 36.0 - 46.0 % 29.7(L) 31.8(L) 35.6(L)  Platelets 150 - 400 K/uL 230 261 231    General: alert, cooperative and no distress Resp: clear to auscultation bilaterally Cardio: regular rate and rhythm GI: soft, non-tender; bowel sounds normal; no masses,  no organomegaly and incision: clean and slight bruising at umbilicus and on Left upper side, with no palpable hematoma. Extremities: extremities normal, atraumatic, no cyanosis or edema and Homans sign is negative, no sign of DVT JP drains clear serous drainage.  Assessment: s/p Procedure(s): SUPRACERVICAL ABDOMINAL HYSTERECTOMY, WIDE EXCISION OF CICATRIX (N/A) BILATERAL SALPINGECTOMY (Bilateral): stable and progressing well  Plan: Discharge home  LOS: 2 days    Jaeceon Michelin V 06/26/2017, 11:00 AM

## 2017-06-26 NOTE — Progress Notes (Signed)
Heidi [name] discharged Home per MD order.  Discharge instructions reviewed and discussed with the patient, all questions and concerns answered. Copy of instructions and scripts given to patient.  Allergies as of 06/26/2017      Reactions   Hydroxyzine    Causes her more anxiety.   Linzess [linaclotide] Hives   Lorazepam    Sedation   Morphine And Related Other (See Comments)   Makes pt.aggressive   Quetiapine    Sweating, irritability      Medication List    STOP taking these medications   empagliflozin 10 MG Tabs tablet Commonly known as:  JARDIANCE   metFORMIN 1000 MG tablet Commonly known as:  GLUCOPHAGE     TAKE these medications   albuterol 108 (90 Base) MCG/ACT inhaler Commonly known as:  PROVENTIL HFA;VENTOLIN HFA Inhale 2 puffs into the lungs every 6 (six) hours as needed for wheezing or shortness of breath.   ALPRAZolam 0.25 MG tablet Commonly known as:  XANAX Take 1 tablet (0.25 mg total) by mouth 2 (two) times daily as needed for anxiety. What changed:  how much to take  reasons to take this   amLODipine 5 MG tablet Commonly known as:  NORVASC Take 1 tablet (5 mg total) by mouth daily. What changed:  when to take this   buPROPion 150 MG 24 hr tablet Commonly known as:  WELLBUTRIN XL Take 1 tablet (150 mg total) by mouth daily.   cholecalciferol 1000 units tablet Commonly known as:  VITAMIN D Take 1,000 Units by mouth daily.   cyclobenzaprine 10 MG tablet Commonly known as:  FLEXERIL Take 1 tablet (10 mg total) by mouth 3 (three) times daily as needed. for muscle spams   dexlansoprazole 60 MG capsule Commonly known as:  DEXILANT Take 1 capsule (60 mg total) by mouth daily.   docusate sodium 100 MG capsule Commonly known as:  COLACE Take 1 capsule (100 mg total) by mouth 2 (two) times daily as needed.   Empagliflozin-Metformin HCl ER 12-998 MG Tb24 Commonly known as:  SYNJARDY XR Take 1 tablet by mouth 2 (two) times daily.   glucose blood  test strip Commonly known as:  ACCU-CHEK GUIDE Use to check BG once daily at varying times of day.   ibuprofen 600 MG tablet Commonly known as:  ADVIL,MOTRIN Take 1 tablet (600 mg total) by mouth every 6 (six) hours as needed (mild pain).   Lancets 30G Misc Dispense Accu-Chek guide lancets.  Check blood sugar at varying times once per day.   lidocaine 2 % jelly Commonly known as:  XYLOCAINE 1 application by Other route as needed. Apply to the rectal area once per day for hemorrhoid discomfort   lisinopril 20 MG tablet Commonly known as:  PRINIVIL,ZESTRIL TAKE ONE TABLET BY MOUTH ONCE DAILY   meloxicam 7.5 MG tablet Commonly known as:  MOBIC TAKE 2 TABLETS BY MOUTH ONCE DAILY What changed:  See the new instructions.   OLANZapine 10 MG tablet Commonly known as:  ZYPREXA TAKE 1 TABLET BY MOUTH AT BEDTIME   oxyCODONE-acetaminophen 5-325 MG tablet Commonly known as:  PERCOCET/ROXICET Take 1-2 tablets by mouth every 4 (four) hours as needed for severe pain (moderate to severe pain (when tolerating fluids)).   sertraline 100 MG tablet Commonly known as:  ZOLOFT Take 2 tablets (200 mg total) by mouth at bedtime.   thiamine 100 MG tablet Take 100 mg by mouth daily.   traZODone 50 MG tablet Commonly known as:  DESYREL Take  1 tablet (50 mg total) by mouth at bedtime as needed for sleep. What changed:  how much to take        IV site discontinued and catheter remains intact. Site without signs and symptoms of complications. Dressing and pressure applied.  Patient escorted to car by in a wheelchair,  no distress noted upon discharge.  Heidi [name] 06/26/2017 12:45 PM

## 2017-06-26 NOTE — Plan of Care (Signed)
Problem: Pain Managment: Goal: General experience of comfort will improve Outcome: Adequate for Discharge Pt c/o 6/10 abdominal pain, PCA Dilaudid discontinues per MD order. Pt taking po Oxycodone per PRN orders. Pt educated on pain mgmt as well as medications available. Pt verbalized understanding. Will continue to monitor pt

## 2017-06-27 ENCOUNTER — Encounter: Payer: Self-pay | Admitting: Family Medicine

## 2017-06-27 DIAGNOSIS — E119 Type 2 diabetes mellitus without complications: Secondary | ICD-10-CM

## 2017-06-27 MED ORDER — LANCETS 30G MISC: 11 refills | Status: AC

## 2017-06-27 MED ORDER — GLUCOSE BLOOD VI STRP: ORAL_STRIP | 3 refills | Status: AC

## 2017-06-29 ENCOUNTER — Encounter: Payer: Self-pay | Admitting: Obstetrics and Gynecology

## 2017-06-30 ENCOUNTER — Encounter: Payer: Self-pay | Admitting: Family Medicine

## 2017-06-30 NOTE — Discharge Summary (Signed)
Physician Discharge Summary  Patient ID: Heidi [name] MRN: [id number] DOB/AGE: 28/10/1990 28 y.o.  Admit date: 06/24/2017 Discharge date: 06/30/2017  Admission Diagnoses: pelvic pain, hx permanent sterilization by ESSURE                                           Discharge Diagnoses: pelvic pain , hx permanent sterilization by ESSURE technique Active Problems:   Status post abdominal supracervical subtotal hysterectomy endometriosis.  Discharged Condition: good  Hospital Course: Heidi SplinterJessica [name] a 26 y.o.G2P0011 here for surgical management of pelvic pain associated with Essure IUD. No significant preoperative concerns. Pt would like her Essure coils returned to her or her lawyer.She has losta small amount ofweight since her last visit. Pt denies any other sx or complaints at this time.   Proposed surgery: Supracervical Hysterectomywith bilateral salpingectomy, with tubes and ovaries to very removed intact with the uterus due to the controversy of the Essure System  Consults: None  Significant Diagnostic Studies: labs:  CBC Latest Ref Rng & Units 06/26/2017 06/25/2017 06/20/2017  WBC 4.0 - 10.5 K/uL 10.3 17.9(H) 12.0(H)  Hemoglobin 12.0 - 15.0 g/dL 4.0(J8.6(L) 8.1(X9.2(L) 10.5(L)  Hematocrit 36.0 - 46.0 % 29.7(L) 31.8(L) 35.6(L)  Platelets 150 - 400 K/uL 230 261 231     Treatments: surgery: supracervical hysterectomy, bilateral salpingectomy, partial panniculectomy,  Discharge Exam: Blood pressure 126/74, pulse 78, temperature 99.1 F (37.3 C), temperature source Oral, resp. rate 18, height 5\' 8"  (1.727 m), weight (!) 302 lb (137 kg), last menstrual period 05/28/2017, SpO2 97 %. General appearance: alert, cooperative, no distress and moderately obese Head: Normocephalic, without obvious abnormality, atraumatic GI: soft, non-tender; bowel sounds normal; no masses,  no organomegaly and incison clean, with slight erythema at umbilicus and right side of incision. Extremities:  extremities normal, atraumatic, no cyanosis or edema and Homans sign is negative, no sign of DVT JP drains with clear serous d/c.nonpurulent, no significant blood. Pt instructed in JP care. Disposition: HOME Pathology returns with findings of endometriosis, of tubes though clinically the surfaces of the uterus and tubes and ovaries appeared smooth and clean.  Discharge Instructions    Call MD for:    Complete by:  As directed    Appointment in 7 days   Call MD for:  redness, tenderness, or signs of infection (pain, swelling, redness, odor or green/yellow discharge around incision site)    Complete by:  As directed    Call MD for:  severe uncontrolled pain    Complete by:  As directed    Call MD for:  temperature >100.4    Complete by:  As directed    Diet - low sodium heart healthy    Complete by:  As directed    Increase activity slowly    Complete by:  As directed      Allergies as of 06/26/2017      Reactions   Hydroxyzine    Causes her more anxiety.   Linzess [linaclotide] Hives   Lorazepam    Sedation   Morphine And Related Other (See Comments)   Makes pt.aggressive   Quetiapine    Sweating, irritability      Medication List    STOP taking these medications   empagliflozin 10 MG Tabs tablet Commonly known as:  JARDIANCE   metFORMIN 1000 MG tablet Commonly known as:  GLUCOPHAGE     TAKE these medications  albuterol 108 (90 Base) MCG/ACT inhaler Commonly known as:  PROVENTIL HFA;VENTOLIN HFA Inhale 2 puffs into the lungs every 6 (six) hours as needed for wheezing or shortness of breath.   ALPRAZolam 0.25 MG tablet Commonly known as:  XANAX Take 1 tablet (0.25 mg total) by mouth 2 (two) times daily as needed for anxiety. What changed:  how much to take  reasons to take this   amLODipine 5 MG tablet Commonly known as:  NORVASC Take 1 tablet (5 mg total) by mouth daily. What changed:  when to take this   buPROPion 150 MG 24 hr tablet Commonly known as:   WELLBUTRIN XL Take 1 tablet (150 mg total) by mouth daily.   cholecalciferol 1000 units tablet Commonly known as:  VITAMIN D Take 1,000 Units by mouth daily.   cyclobenzaprine 10 MG tablet Commonly known as:  FLEXERIL Take 1 tablet (10 mg total) by mouth 3 (three) times daily as needed. for muscle spams   dexlansoprazole 60 MG capsule Commonly known as:  DEXILANT Take 1 capsule (60 mg total) by mouth daily.   docusate sodium 100 MG capsule Commonly known as:  COLACE Take 1 capsule (100 mg total) by mouth 2 (two) times daily as needed.   Empagliflozin-Metformin HCl ER 12-998 MG Tb24 Commonly known as:  SYNJARDY XR Take 1 tablet by mouth 2 (two) times daily.   ibuprofen 600 MG tablet Commonly known as:  ADVIL,MOTRIN Take 1 tablet (600 mg total) by mouth every 6 (six) hours as needed (mild pain).   lidocaine 2 % jelly Commonly known as:  XYLOCAINE 1 application by Other route as needed. Apply to the rectal area once per day for hemorrhoid discomfort   lisinopril 20 MG tablet Commonly known as:  PRINIVIL,ZESTRIL TAKE ONE TABLET BY MOUTH ONCE DAILY   meloxicam 7.5 MG tablet Commonly known as:  MOBIC TAKE 2 TABLETS BY MOUTH ONCE DAILY What changed:  See the new instructions.   OLANZapine 10 MG tablet Commonly known as:  ZYPREXA TAKE 1 TABLET BY MOUTH AT BEDTIME   oxyCODONE-acetaminophen 5-325 MG tablet Commonly known as:  PERCOCET/ROXICET Take 1-2 tablets by mouth every 4 (four) hours as needed for severe pain (moderate to severe pain (when tolerating fluids)).   sertraline 100 MG tablet Commonly known as:  ZOLOFT Take 2 tablets (200 mg total) by mouth at bedtime.   thiamine 100 MG tablet Take 100 mg by mouth daily.   traZODone 50 MG tablet Commonly known as:  DESYREL Take 1 tablet (50 mg total) by mouth at bedtime as needed for sleep. What changed:  how much to take        Signed: Ivyanna [name] V 06/30/2017, 2:52 AM

## 2017-07-01 ENCOUNTER — Encounter: Payer: Self-pay | Admitting: Obstetrics and Gynecology

## 2017-07-02 ENCOUNTER — Encounter: Payer: Medicaid Other | Admitting: Obstetrics and Gynecology

## 2017-07-03 ENCOUNTER — Ambulatory Visit (INDEPENDENT_AMBULATORY_CARE_PROVIDER_SITE_OTHER): Payer: Medicaid Other | Admitting: Obstetrics and Gynecology

## 2017-07-03 VITALS — BP 122/58 | HR 105 | Ht 68.0 in | Wt 303.0 lb

## 2017-07-03 DIAGNOSIS — Z9889 Other specified postprocedural states: Secondary | ICD-10-CM

## 2017-07-03 DIAGNOSIS — Z09 Encounter for follow-up examination after completed treatment for conditions other than malignant neoplasm: Secondary | ICD-10-CM

## 2017-07-03 MED ORDER — TRAMADOL HCL 50 MG PO TABS: 50.0000 mg | ORAL_TABLET | Freq: Four times a day (QID) | ORAL | 0 refills | Status: DC | PRN

## 2017-07-03 MED ORDER — HYDROCHLOROTHIAZIDE 25 MG PO TABS: 25.0000 mg | ORAL_TABLET | Freq: Every day | ORAL | 0 refills | Status: DC

## 2017-07-03 NOTE — Progress Notes (Signed)
   Subjective:  Heidi [name] is a 26 y.o. female now 9 days status post SUPRACERVICAL ABDOMINAL HYSTERECTOMY, WIDE EXCISION OF CICATRIX (N/A) BILATERAL SALPINGECTOMY (Bilateral).     Review of Systems Negative. Pt noted that her bloating has gone away. The only pain she has noted is from her drain tube. She does feel slight abdominal pressure around incision area. She states her symptoms of swollen feet and hands have gone better right after removal of Essure. Pt states that she has stopped experiencing heavy sweating as well.    Diet:   normal   Bowel movements : normal.  Pain is controlled without any medications.  Objective:  BP (!) 122/58 (BP Location: Left Arm, Patient Position: Sitting, Cuff Size: Large)   Pulse (!) 105   Ht 5\' 8"  (1.727 m)   Wt (!) 303 lb (137.4 kg)   SpO2 97%   BMI 46.07 kg/m  General:Well developed, well nourished.  No acute distress. Abdomen: Bowel sounds normal, soft, non-tender. Pelvic Exam: Not indicated   Incision(s):   Healing well, no drainage, no erythema, no hernia, no swelling, no dehiscence,     Assessment:  Post-Op 9 days s/p SUPRACERVICAL ABDOMINAL HYSTERECTOMY, WIDE EXCISION OF CICATRIX (N/A) BILATERAL SALPINGECTOMY (Bilateral)    Doing well postoperatively. Drain tubes were removed.   Plan:  1.Wound care discussed. Showering is ok, encouraged to clean with soap 2. current medications: none. Prescribe 2 weeks of fluid pills, Pain management with Tramadol 50 mg x 30 tabs 3. Activity restrictions: no lifting more than 50 pounds, 20 pounds, no driving until 2 weeks after surgery 4. return to work: not applicable. 5. Follow up in 3 weeks.    Note: Pt wants to know about the pathology report for nickel toxicity Phone calls to pharmacy, no serum nicked tests can be run on the tissues, so we will ask pt to check serum nickel levels at 4 wk postop check.  By signing my name below, I, Izna Ahmed, attest that this documentation has been  prepared under the direction and in the presence of Tilda BurrowFerguson, Verdis Bassette V, MD. Electronically Signed: Redge GainerIzna Ahmed, Medical Scribe. 07/03/17. 10:11 AM.  I personally performed the services described in this documentation, which was SCRIBED in my presence. The recorded information has been reviewed and considered accurate. It has been edited as necessary during review. Tilda BurrowFERGUSON,Altair Stanko V, MD

## 2017-07-04 ENCOUNTER — Encounter: Payer: Self-pay | Admitting: Obstetrics and Gynecology

## 2017-07-05 ENCOUNTER — Encounter (HOSPITAL_COMMUNITY): Payer: Self-pay | Admitting: *Deleted

## 2017-07-05 ENCOUNTER — Emergency Department (HOSPITAL_COMMUNITY)
Admission: EM | Admit: 2017-07-05 | Discharge: 2017-07-06 | Disposition: A | Discharge status: Home / Self Care | Payer: Medicaid Other | Attending: Emergency Medicine | Admitting: Emergency Medicine

## 2017-07-05 DIAGNOSIS — E119 Type 2 diabetes mellitus without complications: Secondary | ICD-10-CM | POA: Diagnosis not present

## 2017-07-05 DIAGNOSIS — I10 Essential (primary) hypertension: Secondary | ICD-10-CM | POA: Insufficient documentation

## 2017-07-05 DIAGNOSIS — F17210 Nicotine dependence, cigarettes, uncomplicated: Secondary | ICD-10-CM | POA: Insufficient documentation

## 2017-07-05 DIAGNOSIS — Z79899 Other long term (current) drug therapy: Secondary | ICD-10-CM | POA: Diagnosis not present

## 2017-07-05 DIAGNOSIS — L03311 Cellulitis of abdominal wall: Secondary | ICD-10-CM | POA: Diagnosis not present

## 2017-07-05 DIAGNOSIS — R21 Rash and other nonspecific skin eruption: Secondary | ICD-10-CM | POA: Diagnosis present

## 2017-07-05 LAB — BASIC METABOLIC PANEL
ANION GAP: 7 (ref 5–15)
BUN: 7 mg/dL (ref 6–20)
CALCIUM: 8.5 mg/dL — AB (ref 8.9–10.3)
CO2: 26 mmol/L (ref 22–32)
CREATININE: 0.7 mg/dL (ref 0.44–1.00)
Chloride: 100 mmol/L — ABNORMAL LOW (ref 101–111)
Glucose, Bld: 185 mg/dL — ABNORMAL HIGH (ref 65–99)
Potassium: 3.8 mmol/L (ref 3.5–5.1)
SODIUM: 133 mmol/L — AB (ref 135–145)

## 2017-07-05 LAB — CBC
HCT: 32 % — ABNORMAL LOW (ref 36.0–46.0)
Hemoglobin: 9.5 g/dL — ABNORMAL LOW (ref 12.0–15.0)
MCH: 21.7 pg — ABNORMAL LOW (ref 26.0–34.0)
MCHC: 29.7 g/dL — ABNORMAL LOW (ref 30.0–36.0)
MCV: 73.2 fL — ABNORMAL LOW (ref 78.0–100.0)
PLATELETS: ADEQUATE 10*3/uL (ref 150–400)
RBC: 4.37 MIL/uL (ref 3.87–5.11)
RDW: 19.4 % — AB (ref 11.5–15.5)
WBC: 20.5 10*3/uL — AB (ref 4.0–10.5)

## 2017-07-05 MED ORDER — CEFTRIAXONE SODIUM 1 G IJ SOLR
1.0000 g | Freq: Once | INTRAMUSCULAR | Status: AC
  Administered 2017-07-05: 1 g via INTRAMUSCULAR
  Filled 2017-07-05: qty 10

## 2017-07-05 MED ORDER — DEXTROSE 5 % IV SOLN
1.0000 g | Freq: Once | INTRAVENOUS | Status: DC
  Filled 2017-07-05: qty 10

## 2017-07-05 NOTE — ED Triage Notes (Signed)
Pt had hysterectomy on 10/23, noted yesterday some redness, swelling to site and was put on a fluid pill.  Pt states she had a fever of 100.3 an hour ago and took 600mg  motrin. No fever noted in triage.

## 2017-07-05 NOTE — ED Provider Notes (Signed)
[facility] EMERGENCY DEPARTMENT Provider Note   CSN: [id number] Arrival date & time: 07/05/17  2203     History   Chief Complaint Chief Complaint  Patient presents with  . Post-op Problem    HPI Heidi [name] is a 26 y.o. female.  HPI Patient presents to the emergency room for evaluation of low-grade fever and redness around her surgical incision site.  Patient had a supracervical abdominal hysterectomy 11 days ago.  Patient states she had been doing well she followed up with her gynecologist on the first.  In the last day or so however she started noticing redness in her lower abdomen around her surgical wound.  Today she had a low-grade fever at home.  She was worried that she was developing a cellulitis so she came to the emergency room for evaluation she denies any vomiting.  No chest pain or shortness of breath. Past Medical History:  Diagnosis Date  . Abdominal pain 01/10/2016  . Anxiety   . Back pain   . Bloating 01/10/2016  . Depression   . GERD (gastroesophageal reflux disease)   . Hematuria 01/10/2016  . Herpes simplex virus (HSV) infection   . Hypertension   . Osteoarthritis   . Prediabetes   . Schizo-affective schizophrenia (HCC)   . Type 2 diabetes mellitus (HCC) 03/28/2016  . Urinary frequency 01/10/2016    Patient Active Problem List   Diagnosis Date Noted  . Status post abdominal supracervical subtotal hysterectomy 06/24/2017  . Anal fissure 05/06/2017  . 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 Morbid obesity (HCC) 04/15/2017  . Pelvic pain in female 04/15/2017  . GERD (gastroesophageal reflux disease) 03/13/2017  . Abnormal liver function tests 03/13/2017  . Abdominal pain, epigastric 03/13/2017  . Rectal bleed 01/28/2017  . Type 2 diabetes mellitus (HCC) 03/28/2016  . Paresthesia 01/18/2016  . Neuropathic pain 11/10/2015  . Essential hypertension, benign 07/21/2015  . Anxiety and depression 06/22/2015    Past Surgical History:    Procedure Laterality Date  . BILATERAL SALPINGECTOMY Bilateral 06/24/2017   Procedure: BILATERAL SALPINGECTOMY;  Surgeon: Tilda BurrowFerguson, John V, MD;  Location: [facility] ORS;  Service: Gynecology;  Laterality: Bilateral;  . CESAREAN SECTION    . COLONOSCOPY WITH PROPOFOL N/A 03/17/2017   Procedure: COLONOSCOPY WITH PROPOFOL;  Surgeon: Corbin Adeourk, Robert M, MD;  Location: [facility] ENDO SUITE;  Service: Endoscopy;  Laterality: N/A;  9:30am  . DILATION AND CURETTAGE OF UTERUS    . ESOPHAGOGASTRODUODENOSCOPY (EGD) WITH PROPOFOL N/A 03/17/2017   Procedure: ESOPHAGOGASTRODUODENOSCOPY (EGD) WITH PROPOFOL;  Surgeon: Corbin Adeourk, Robert M, MD;  Location: [facility] SUITE;  Service: Endoscopy;  Laterality: N/A;  . ESSURE TUBAL LIGATION    . SUPRACERVICAL ABDOMINAL HYSTERECTOMY N/A 06/24/2017   Procedure: SUPRACERVICAL ABDOMINAL HYSTERECTOMY, WIDE EXCISION OF CICATRIX;  Surgeon: Tilda BurrowFerguson, John V, MD;  Location: [facility];  Service: Gynecology;  Laterality: N/A;    OB History    Gravida Para Term Preterm AB Living   2 1     1 1    SAB TAB Ectopic Multiple Live Births   1       1       Home Medications    Prior to Admission medications   Medication Sig Start Date End Date Taking? Authorizing Provider  albuterol (PROVENTIL HFA;VENTOLIN HFA) 108 (90 Base) MCG/ACT inhaler Inhale 2 puffs into the lungs every 6 (six) hours as needed for wheezing or shortness of breath. 04/18/17  Yes Dettinger, Elige RadonJoshua A, MD  ALPRAZolam Prudy Feeler(XANAX) 0.25 MG tablet  Take 1 tablet (0.25 mg total) by mouth 2 (two) times daily as needed for anxiety. Patient taking differently: Take 0.125-0.25 mg by mouth 2 (two) times daily as needed for anxiety (depends on anxiety if takes 05-0 tablet).  01/20/17  Yes Dettinger, Elige Radon, MD  amLODipine (NORVASC) 5 MG tablet Take 1 tablet (5 mg total) by mouth daily. Patient taking differently: Take 5 mg by mouth at bedtime.  12/23/16  Yes Dettinger, Elige Radon, MD  cholecalciferol (VITAMIN D) 1000 units tablet Take 1,000 Units by mouth  daily.    Yes [provider]  cyclobenzaprine (FLEXERIL) 10 MG tablet Take 1 tablet (10 mg total) by mouth 3 (three) times daily as needed. for muscle spams 06/17/17  Yes Dettinger, Elige Radon, MD  dexlansoprazole (DEXILANT) 60 MG capsule Take 1 capsule (60 mg total) by mouth daily. 04/16/17  Yes Tiffany Kocher, PA-C  docusate sodium (COLACE) 100 MG capsule Take 1 capsule (100 mg total) by mouth 2 (two) times daily as needed. 06/26/17  Yes Tilda Burrow, MD  glucose blood (ACCU-CHEK GUIDE) test strip Use to check BG once daily at varying times of day. 06/27/17  Yes Dettinger, Elige Radon, MD  hydrochlorothiazide (HYDRODIURIL) 25 MG tablet Take 1 tablet (25 mg total) by mouth daily. 07/03/17  Yes Tilda Burrow, MD  ibuprofen (ADVIL,MOTRIN) 600 MG tablet Take 1 tablet (600 mg total) by mouth every 6 (six) hours as needed (mild pain). 06/26/17  Yes Tilda Burrow, MD  Lancets 30G MISC Dispense Accu-Chek guide lancets.  Check blood sugar at varying times once per day. 06/27/17  Yes Dettinger, Elige Radon, MD  lisinopril (PRINIVIL,ZESTRIL) 20 MG tablet TAKE ONE TABLET BY MOUTH ONCE DAILY 12/30/16  Yes Dettinger, Elige Radon, MD  meloxicam (MOBIC) 7.5 MG tablet TAKE 2 TABLETS BY MOUTH ONCE DAILY Patient taking differently: TAKE 2 TABLETS BY MOUTH EVERY EVENING 03/28/17  Yes Dettinger, Elige Radon, MD  OLANZapine (ZYPREXA) 10 MG tablet TAKE 1 TABLET BY MOUTH AT BEDTIME 05/26/17  Yes Dettinger, Elige Radon, MD  oxyCODONE-acetaminophen (PERCOCET/ROXICET) 5-325 MG tablet Take 1-2 tablets by mouth every 4 (four) hours as needed for severe pain (moderate to severe pain (when tolerating fluids)). 06/26/17  Yes Tilda Burrow, MD  sertraline (ZOLOFT) 100 MG tablet Take 2 tablets (200 mg total) by mouth at bedtime. 10/23/16  Yes Dettinger, Elige Radon, MD  traMADol (ULTRAM) 50 MG tablet Take 1 tablet (50 mg total) by mouth every 6 (six) hours as needed for moderate pain. 07/03/17  Yes Tilda Burrow, MD  traZODone  (DESYREL) 50 MG tablet Take 1 tablet (50 mg total) by mouth at bedtime as needed for sleep. Patient taking differently: Take 25 mg by mouth at bedtime as needed for sleep.  02/05/17  Yes Dettinger, Elige Radon, MD  buPROPion (WELLBUTRIN XL) 150 MG 24 hr tablet Take 1 tablet (150 mg total) by mouth daily. Patient not taking: Reported on 07/05/2017 06/23/17   Dettinger, Elige Radon, MD  cephALEXin (KEFLEX) 500 MG capsule Take 1 capsule (500 mg total) by mouth 4 (four) times daily. 07/06/17   Linwood Dibbles, MD    Family History Family History  Problem Relation Age of Onset  . Depression Mother   . Hypertension Mother   . Diabetes Mother   . Mental illness Father   . Hypertension Father   . Diabetes Father   . Cancer Maternal Aunt   . Diabetes Paternal Grandmother   . Hypertension Paternal Grandmother   .  Arthritis Paternal Grandmother   . Stroke Paternal Grandfather   . Heart disease Paternal Grandfather   . Hypertension Paternal Grandfather   . Diabetes Paternal Grandfather   . Schizophrenia Maternal Grandfather   . Alcohol abuse Maternal Grandfather   . Other Sister        recovering addict  . Schizophrenia Sister   . Irritable bowel syndrome Sister   . Other Sister        recovering addict  . Bipolar disorder Sister   . Seizures Sister   . Colon cancer Neg Hx   . Inflammatory bowel disease Neg Hx   . Celiac disease Neg Hx     Social History Social History  Substance Use Topics  . Smoking status: Current Every Day Smoker    Packs/day: 1.00    Years: 10.00    Types: Cigarettes  . Smokeless tobacco: Never Used  . Alcohol use 0.0 oz/week     Comment: Occasionally      Allergies   Hydroxyzine; Linzess [linaclotide]; Lorazepam; Morphine and related; and Quetiapine   Review of Systems Review of Systems  All other systems reviewed and are negative.    Physical Exam Updated Vital Signs BP 123/74 (BP Location: Right Arm)   Pulse (!) 115   Temp 97.8 F (36.6 C) (Oral)    Resp 20   Ht 1.727 m (5\' 8" )   Wt (!) 137 kg (302 lb)   LMP 05/28/2017   SpO2 99%   BMI 45.92 kg/m   Physical Exam  Constitutional: No distress.  Obese  HENT:  Head: Normocephalic and atraumatic.  Right Ear: External ear normal.  Left Ear: External ear normal.  Eyes: Conjunctivae are normal. Right eye exhibits no discharge. Left eye exhibits no discharge. No scleral icterus.  Neck: Neck supple. No tracheal deviation present.  Cardiovascular: Normal rate, regular rhythm and intact distal pulses.   Pulmonary/Chest: Effort normal and breath sounds normal. No stridor. No respiratory distress. She has no wheezes. She has no rales.  Abdominal: Soft. Bowel sounds are normal. She exhibits no distension. There is no tenderness. There is no rebound and no guarding.  Large abdominal pannus, surgical wound without purulent drainage appears to be healing well, there is erythema in the inferior aspect of her abdominal  wall/pannus bilaterally with tenderness and increased warmth  Musculoskeletal: She exhibits no edema or tenderness.  Neurological: She is alert. She has normal strength. No cranial nerve deficit (no facial droop, extraocular movements intact, no slurred speech) or sensory deficit. She exhibits normal muscle tone. She displays no seizure activity. Coordination normal.  Skin: Skin is warm and dry. No rash noted.  Psychiatric: She has a normal mood and affect.  Nursing note and vitals reviewed.    ED Treatments / Results  Labs (all labs ordered are listed, but only abnormal results are displayed) Labs Reviewed  CBC - Abnormal; Notable for the following:       Result Value   WBC 20.5 (*)    Hemoglobin 9.5 (*)    HCT 32.0 (*)    MCV 73.2 (*)    MCH 21.7 (*)    MCHC 29.7 (*)    RDW 19.4 (*)    All other components within normal limits  BASIC METABOLIC PANEL - Abnormal; Notable for the following:    Sodium 133 (*)    Chloride 100 (*)    Glucose, Bld 185 (*)    Calcium 8.5 (*)     All other components within  normal limits      Radiology No results found.  Procedures Procedures (including critical care time)  Medications Ordered in ED Medications  cefTRIAXone (ROCEPHIN) injection 1 g (1 g Intramuscular Given 07/05/17 2340)     Initial Impression / Assessment and Plan / ED Course  I have reviewed the triage vital signs and the nursing notes.  Pertinent labs & imaging results that were available during my care of the patient were reviewed by me and considered in my medical decision making (see chart for details).   She presented to the emergency room with abdominal wall erythema consistent with a cellulitis.  No signs of any purulent drainage around her surgical incision.  No findings to suggest abscess.  Laboratory tests do show a significant leukocytosis however the patient is afebrile and appears well.  She was given a dose of IM antibiotics because they were not able to establish IV access.  Patient will be discharged home on oral antibiotics.  We discussed returning to the emergency room for high fevers worsening symptoms.  Otherwise she will follow up with her OB/GYN doctor to be rechecked early this week.  Final Clinical Impressions(s) / ED Diagnoses   Final diagnoses:  Cellulitis of abdominal wall    New Prescriptions New Prescriptions   CEPHALEXIN (KEFLEX) 500 MG CAPSULE    Take 1 capsule (500 mg total) by mouth 4 (four) times daily.     Linwood Dibbles, MD 07/06/17 [phone]

## 2017-07-06 MED ORDER — CEPHALEXIN 500 MG PO CAPS: 500.0000 mg | ORAL_CAPSULE | Freq: Four times a day (QID) | ORAL | 0 refills | Status: DC

## 2017-07-06 NOTE — Discharge Instructions (Signed)
Take the antibiotics as prescribed, follow  up with your GYN doctor, monitor for high  fevers ,vomiting, worsening symptoms

## 2017-07-09 NOTE — Telephone Encounter (Signed)
Please refer to previous notes.

## 2017-07-13 ENCOUNTER — Other Ambulatory Visit: Payer: Self-pay | Admitting: Family Medicine

## 2017-07-13 DIAGNOSIS — G8929 Other chronic pain: Secondary | ICD-10-CM

## 2017-07-13 DIAGNOSIS — M545 Low back pain: Principal | ICD-10-CM

## 2017-07-14 ENCOUNTER — Encounter: Payer: Self-pay | Admitting: *Deleted

## 2017-07-14 NOTE — Telephone Encounter (Signed)
Last seen 04/24/17  Dr Louanne Skyedettinger

## 2017-07-23 ENCOUNTER — Encounter: Payer: Medicaid Other | Admitting: Obstetrics and Gynecology

## 2017-07-28 ENCOUNTER — Encounter: Payer: Medicaid Other | Admitting: Obstetrics and Gynecology

## 2017-07-31 ENCOUNTER — Other Ambulatory Visit: Payer: Self-pay

## 2017-07-31 ENCOUNTER — Ambulatory Visit (INDEPENDENT_AMBULATORY_CARE_PROVIDER_SITE_OTHER): Payer: Medicaid Other | Admitting: Obstetrics and Gynecology

## 2017-07-31 ENCOUNTER — Encounter: Payer: Self-pay | Admitting: Obstetrics and Gynecology

## 2017-07-31 ENCOUNTER — Telehealth: Payer: Self-pay | Admitting: *Deleted

## 2017-07-31 VITALS — BP 130/82 | HR 94 | Ht 68.0 in | Wt 301.0 lb

## 2017-07-31 DIAGNOSIS — Z9889 Other specified postprocedural states: Secondary | ICD-10-CM

## 2017-07-31 DIAGNOSIS — T8149XA Infection following a procedure, other surgical site, initial encounter: Secondary | ICD-10-CM

## 2017-07-31 DIAGNOSIS — R1084 Generalized abdominal pain: Secondary | ICD-10-CM

## 2017-07-31 DIAGNOSIS — Z90711 Acquired absence of uterus with remaining cervical stump: Secondary | ICD-10-CM

## 2017-07-31 DIAGNOSIS — R102 Pelvic and perineal pain: Secondary | ICD-10-CM

## 2017-07-31 MED ORDER — DOXYCYCLINE HYCLATE 100 MG PO CAPS: 100.0000 mg | ORAL_CAPSULE | Freq: Two times a day (BID) | ORAL | 1 refills | Status: DC

## 2017-07-31 MED ORDER — METRONIDAZOLE 500 MG PO TABS: 500.0000 mg | ORAL_TABLET | Freq: Two times a day (BID) | ORAL | 0 refills | Status: DC

## 2017-07-31 NOTE — Progress Notes (Signed)
   Subjective:  Heidi [name] is a 26 y.o. female now 5 weeks status post supracervical hysterectomy with bilateral salpingectomy due to Essure.  Bilateral, with essure removed intact. Patient was seen in the emergency room 1 day after my last office visit here for some erythema in the incision, was placed on antibiotics given single IM dose of antibiotics and returns now afebrile but with some redness in the area beneath the umbilicus and a little bit of local heat.  She either has a wound seroma that is gradually closing in or some low-grade subclinical infection Review of Systems Negative except for the incisional erythema.  She says it has not really hurt and has not been draining   Diet:   Regular   Bowel movements : normal.  The lower abdomen is slightly tender to palpation just below the umbilicus in the pannus there is some local erythema and it feels just a little bit warm the scar has become somewhat retracted in the midline.  Reviewing her old results showed white count 20,000 when seen in the emergency room.  She did not consider making follow-up appointment of calling her office  Objective:  BP 130/82 (BP Location: Right Arm, Patient Position: Sitting, Cuff Size: Large)   Pulse 94   Ht 5\' 8"  (1.727 m)   Wt (!) 301 lb (136.5 kg)   LMP 05/28/2017   BMI 45.77 kg/m  General:Well developed, well nourished.  No acute distress. Abdomen: Bowel sounds normal, soft, non-tender. Pelvic Exam:    External Genitalia:  Normal.    Vagina: Normal    Cervix: Normal    Uterus: Absent    Adnexa/Bimanual: Normal  Incision(s):   Healing with some question of infection versus seroma, no drainage,  erythema in the midline and above the incision, none on the sides were well-healed and nontender, no hernia,  swelling, no dehiscence,     Assessment:  Post-Op 5 weeks s/p supracervical hysterectomy  Postoperative wound seroma versus subclinical mild infection This will slow healing  postoperatively will plan on her being out of work 8 weeks, begin doxycycline and Flagyl and follow her up in 2 weeks.   Plan:  1.Wound care discussed will obtain CT abdomen and pelvis 2. . current medications.  Doxycycline and Flagyl added 3. Activity restrictions: none 4. return to work: 2-3 weeks. 5. Follow up in 2 weeks. 6.  Begin doxycycline 100 mg twice daily times 7 days and Flagyl 500 twice daily times 7 days follow-up 2 weeks 7 check CBC and sed rate

## 2017-07-31 NOTE — Telephone Encounter (Signed)
LMOVM that she is scheduled for CT with contrast on Dec. 10 @ 4pm (needs to arrive 3:45), nothing by mouth 4 hours prior and needs to pick up 2 bottles of contrast from [facility] in the next couple of days. Informed if this day and time does not work for her , to give them a call.

## 2017-08-05 ENCOUNTER — Telehealth: Payer: Self-pay | Admitting: *Deleted

## 2017-08-05 MED ORDER — DOXYCYCLINE HYCLATE 100 MG PO CAPS: 100.0000 mg | ORAL_CAPSULE | Freq: Two times a day (BID) | ORAL | 1 refills | Status: DC

## 2017-08-05 NOTE — Telephone Encounter (Signed)
Called patient to see if she picked up lab slips from last visit and stated she did not. Patient to come have lab draw on 12/10 when she comes for CT. Also states she did not get second antibiotic because pharmacy did not have it. Informed it was printed so prescription resent. Pt to pick up today.

## 2017-08-11 ENCOUNTER — Ambulatory Visit (HOSPITAL_COMMUNITY): Admission: RE | Admit: 2017-08-11 | Payer: Medicaid Other | Source: Ambulatory Visit

## 2017-08-11 ENCOUNTER — Other Ambulatory Visit: Payer: Self-pay

## 2017-08-12 ENCOUNTER — Telehealth: Payer: Self-pay | Admitting: Obstetrics and Gynecology

## 2017-08-12 NOTE — Telephone Encounter (Signed)
Patient states she did not have her CT done due to the weather but is rescheduled for 12/20. Appt changed to 12/26 with Dr Emelda FearFerguson to review CT.

## 2017-08-13 ENCOUNTER — Encounter: Payer: Medicaid Other | Admitting: Obstetrics and Gynecology

## 2017-08-15 ENCOUNTER — Other Ambulatory Visit: Payer: Self-pay | Admitting: Family

## 2017-08-15 ENCOUNTER — Other Ambulatory Visit: Payer: Self-pay | Admitting: Family Medicine

## 2017-08-15 DIAGNOSIS — F419 Anxiety disorder, unspecified: Principal | ICD-10-CM

## 2017-08-15 DIAGNOSIS — M545 Low back pain, unspecified: Secondary | ICD-10-CM

## 2017-08-15 DIAGNOSIS — G8929 Other chronic pain: Secondary | ICD-10-CM

## 2017-08-15 DIAGNOSIS — F329 Major depressive disorder, single episode, unspecified: Secondary | ICD-10-CM

## 2017-08-15 DIAGNOSIS — I10 Essential (primary) hypertension: Secondary | ICD-10-CM

## 2017-08-18 NOTE — Telephone Encounter (Signed)
Last seen 04/24/17  Dr Dettinger 

## 2017-08-18 NOTE — Telephone Encounter (Signed)
Last seen 04/24/17  Dr Dettinger

## 2017-08-21 ENCOUNTER — Ambulatory Visit (HOSPITAL_COMMUNITY)
Admission: RE | Admit: 2017-08-21 | Discharge: 2017-08-21 | Disposition: A | Discharge status: Home / Self Care | Payer: Medicaid Other | Source: Ambulatory Visit | Attending: Obstetrics and Gynecology | Admitting: Obstetrics and Gynecology

## 2017-08-21 DIAGNOSIS — R161 Splenomegaly, not elsewhere classified: Secondary | ICD-10-CM | POA: Diagnosis not present

## 2017-08-21 DIAGNOSIS — R935 Abnormal findings on diagnostic imaging of other abdominal regions, including retroperitoneum: Secondary | ICD-10-CM | POA: Insufficient documentation

## 2017-08-21 DIAGNOSIS — R1084 Generalized abdominal pain: Secondary | ICD-10-CM | POA: Diagnosis not present

## 2017-08-21 DIAGNOSIS — K760 Fatty (change of) liver, not elsewhere classified: Secondary | ICD-10-CM | POA: Diagnosis not present

## 2017-08-21 LAB — POCT I-STAT CREATININE: Creatinine, Ser: 0.6 mg/dL (ref 0.44–1.00)

## 2017-08-21 MED ORDER — IOPAMIDOL (ISOVUE-300) INJECTION 61%
100.0000 mL | Freq: Once | INTRAVENOUS | Status: AC | PRN
  Administered 2017-08-21: 100 mL via INTRAVENOUS

## 2017-08-22 LAB — CBC WITH DIFFERENTIAL/PLATELET
BASOS ABS: 0 10*3/uL (ref 0.0–0.2)
Basos: 0 %
EOS (ABSOLUTE): 0.2 10*3/uL (ref 0.0–0.4)
EOS: 2 %
HEMATOCRIT: 33.8 % — AB (ref 34.0–46.6)
HEMOGLOBIN: 10 g/dL — AB (ref 11.1–15.9)
Immature Grans (Abs): 0 10*3/uL (ref 0.0–0.1)
Immature Granulocytes: 0 %
LYMPHS ABS: 2.9 10*3/uL (ref 0.7–3.1)
Lymphs: 27 %
MCH: 21.9 pg — AB (ref 26.6–33.0)
MCHC: 29.6 g/dL — AB (ref 31.5–35.7)
MCV: 74 fL — ABNORMAL LOW (ref 79–97)
MONOCYTES: 5 %
MONOS ABS: 0.6 10*3/uL (ref 0.1–0.9)
NEUTROS ABS: 7 10*3/uL (ref 1.4–7.0)
Neutrophils: 66 %
Platelets: 277 10*3/uL (ref 150–379)
RBC: 4.56 x10E6/uL (ref 3.77–5.28)
RDW: 20.4 % — ABNORMAL HIGH (ref 12.3–15.4)
WBC: 10.7 10*3/uL (ref 3.4–10.8)

## 2017-08-22 LAB — SEDIMENTATION RATE: Sed Rate: 16 mm/hr (ref 0–32)

## 2017-08-27 ENCOUNTER — Ambulatory Visit (INDEPENDENT_AMBULATORY_CARE_PROVIDER_SITE_OTHER): Payer: Medicaid Other | Admitting: Obstetrics and Gynecology

## 2017-08-27 ENCOUNTER — Other Ambulatory Visit: Payer: Self-pay

## 2017-08-27 ENCOUNTER — Ambulatory Visit: Payer: Medicaid Other | Admitting: Family Medicine

## 2017-08-27 ENCOUNTER — Encounter: Payer: Self-pay | Admitting: Obstetrics and Gynecology

## 2017-08-27 VITALS — BP 126/74 | HR 92 | Ht 68.0 in | Wt 304.0 lb

## 2017-08-27 DIAGNOSIS — Z09 Encounter for follow-up examination after completed treatment for conditions other than malignant neoplasm: Secondary | ICD-10-CM

## 2017-08-27 DIAGNOSIS — Z9889 Other specified postprocedural states: Secondary | ICD-10-CM

## 2017-08-27 NOTE — Progress Notes (Signed)
   Subjective:  Heidi [name] is a 26 y.o. female now 9 weeks status post supracervical hysterectomy with bilateral salpingectomy due to Essure. She was last seen in office on 07/31/17, healing with some question of infection vs seroma. Pt was prescribed doxycycline and flagyl, and asked to obtain a CT of abdomen and pelvis. She states she definitely breathes easier. She states her heart rate goes up when she does physical activity, which she knows is related to her weight.. 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  She denies any pain when she has intercourse   Review of Systems Negative except a tinge of right and left sided pelvic pain around the 20th-25th of the month. This time is when she would have her period. She believes it may be period related..    Diet:   normal   Bowel movements : normal.  The patient is not having any pain. She mentions occasional gas pain but is not concerned.  Objective:  Ht 5\' 8"  (1.727 m)   Wt (!) 304 lb (137.9 kg)   LMP 05/28/2017   BMI 46.22 kg/m  General:Well developed, well nourished.  No acute distress. Abdomen: Bowel sounds normal, soft, non-tender. incision is softening nicely, nontender to touch, skin moves nicely. Pelvic Exam:    External Genitalia:  Normal.    Vagina: Normal    Cervix: Normal    Uterus: Absent    Adnexa/Bimanual: Normal  Incision(s):   Healing well, no drainage, no erythema, no hernia, no swelling, no dehiscence,     Assessment:  Post-Op 9 weeks s/p supracervical hysterectomy with bilateral salpingectomy due to Essure.     Doing well postoperatively.   Plan:  1.Wound care discussed. CT of abdomen and pelvis discussed 2. . current medications. 3. Activity restrictions: none 4. return to work: now. 5. Follow up PRN, or in 3 years for Pap   By signing my name below, I, Izna Ahmed, attest that this documentation has been prepared under  the direction and in the presence of Tilda BurrowFerguson, Jeremian Whitby V, MD. Electronically Signed: Redge GainerIzna Ahmed, Medical Scribe. 08/27/17. 3:51 PM.  I personally performed the services described in this documentation, which was SCRIBED in my presence. The recorded information has been reviewed and considered accurate. It has been edited as necessary during review. Tilda BurrowJohn V Kiylee Thoreson, MD

## 2017-08-29 ENCOUNTER — Ambulatory Visit: Payer: Medicaid Other | Admitting: Family Medicine

## 2017-08-29 ENCOUNTER — Encounter: Payer: Self-pay | Admitting: Family Medicine

## 2017-08-29 VITALS — BP 138/84 | HR 88 | Temp 99.7°F | Ht 68.0 in | Wt 302.0 lb

## 2017-08-29 DIAGNOSIS — I152 Hypertension secondary to endocrine disorders: Secondary | ICD-10-CM

## 2017-08-29 DIAGNOSIS — F419 Anxiety disorder, unspecified: Secondary | ICD-10-CM

## 2017-08-29 DIAGNOSIS — Z9109 Other allergy status, other than to drugs and biological substances: Secondary | ICD-10-CM

## 2017-08-29 DIAGNOSIS — F329 Major depressive disorder, single episode, unspecified: Secondary | ICD-10-CM

## 2017-08-29 DIAGNOSIS — I10 Essential (primary) hypertension: Secondary | ICD-10-CM | POA: Diagnosis not present

## 2017-08-29 DIAGNOSIS — M1711 Unilateral primary osteoarthritis, right knee: Secondary | ICD-10-CM | POA: Diagnosis not present

## 2017-08-29 DIAGNOSIS — E119 Type 2 diabetes mellitus without complications: Secondary | ICD-10-CM

## 2017-08-29 DIAGNOSIS — E1159 Type 2 diabetes mellitus with other circulatory complications: Secondary | ICD-10-CM

## 2017-08-29 DIAGNOSIS — M1712 Unilateral primary osteoarthritis, left knee: Secondary | ICD-10-CM

## 2017-08-29 LAB — BAYER DCA HB A1C WAIVED: HB A1C: 7.4 % — AB (ref <7.0)

## 2017-08-29 MED ORDER — METHYLPREDNISOLONE ACETATE 80 MG/ML IJ SUSP
80.0000 mg | Freq: Once | INTRAMUSCULAR | Status: AC
  Administered 2017-08-29: 80 mg via INTRAMUSCULAR

## 2017-08-29 MED ORDER — ALPRAZOLAM 0.25 MG PO TABS: 0.1250 mg | ORAL_TABLET | Freq: Two times a day (BID) | ORAL | 2 refills | Status: DC | PRN

## 2017-08-29 NOTE — Progress Notes (Signed)
BP 138/84   Pulse 88   Temp 99.7 F (37.6 C) (Oral)   Ht '5\' 8"'  (1.727 m)   Wt (!) 302 lb (137 kg)   LMP 05/28/2017   BMI 45.92 kg/m    Subjective:    Patient ID: Heidi [name], female    DOB: 07-18-1991, 26 y.o.   MRN: [id number]  HPI: Heidi [name] is a 27 y.o. female presenting on 08/29/2017 for Diabetes (follow up) and Bilateral knee pain (would like knees injected)   HPI Type 2 diabetes mellitus Patient comes in today for recheck of his diabetes. Patient has been currently taking metformin. Patient is currently on an ACE inhibitor/ARB. Patient has not seen an ophthalmologist this year. Patient denies any issues with their feet.   Hypertension Patient is currently on hydrochlorothiazide and lisinopril and amlodipine, and their blood pressure today is 138/84. Patient denies any lightheadedness or dizziness. Patient denies headaches, blurred vision, chest pains, shortness of breath, or weakness. Denies any side effects from medication and is content with current medication.   Osteoarthritis of both knees Patient is coming in for recheck of her osteoarthritis of both knees.  Is been 4 months since her last injection and she would like to go ahead and do injections again.  She denies any weakness or numbness.  Concern for nickel allergy Patient had E sure contraception and there was some concern about nickel allergy and she wants to be sent to an allergist for testing  Relevant past medical, surgical, family and social history reviewed and updated as indicated. Interim medical history since our last visit reviewed. Allergies and medications reviewed and updated.  Review of Systems  Constitutional: Negative for chills and fever.  Eyes: Negative for visual disturbance.  Respiratory: Negative for chest tightness and shortness of breath.   Cardiovascular: Negative for chest pain and leg swelling.  Musculoskeletal: Positive for arthralgias. Negative for back pain and gait problem.    Skin: Negative for rash.  Neurological: Negative for dizziness, light-headedness and headaches.  Psychiatric/Behavioral: Negative for agitation and behavioral problems.  All other systems reviewed and are negative.   Per HPI unless specifically indicated above        Objective:    BP 138/84   Pulse 88   Temp 99.7 F (37.6 C) (Oral)   Ht '5\' 8"'  (1.727 m)   Wt (!) 302 lb (137 kg)   LMP 05/28/2017   BMI 45.92 kg/m   Wt Readings from Last 3 Encounters:  08/29/17 (!) 302 lb (137 kg)  08/27/17 (!) 304 lb (137.9 kg)  07/31/17 (!) 301 lb (136.5 kg)    Physical Exam  Constitutional: She is oriented to person, place, and time. She appears well-developed and well-nourished. No distress.  Eyes: Conjunctivae are normal.  Neck: Neck supple. No thyromegaly present.  Cardiovascular: Normal rate, regular rhythm, normal heart sounds and intact distal pulses.  No murmur heard. Pulmonary/Chest: Effort normal and breath sounds normal. No respiratory distress. She has no wheezes. She has no rales.  Musculoskeletal: Normal range of motion. She exhibits tenderness (Bilateral knee tenderness, normal ligament testing on exam). She exhibits no edema.  Lymphadenopathy:    She has no cervical adenopathy.  Neurological: She is alert and oriented to person, place, and time. Coordination normal.  Skin: Skin is warm and dry. No rash noted. She is not diaphoretic.  Psychiatric: She has a normal mood and affect. Her behavior is normal.  Nursing note and vitals reviewed.  Bilateral knee injection: Verbal consent  obtained. Risk factors of bleeding and infection discussed with patient and patient is agreeable towards injection. Patient prepped with Betadine. Lateral approach towards injection used. Injected 80 mg of Depo-Medrol and 1 mL of 2% lidocaine on both sides. Patient tolerated procedure well and no side effects from noted. Minimal to no bleeding. Simple bandage applied after.      Assessment & Plan:    Problem List Items Addressed This Visit      Cardiovascular and Mediastinum   Hypertension associated with diabetes ([place])   Relevant Orders   CMP14+EGFR   CBC with Differential/Platelet   Lipid panel     Endocrine   Type 2 diabetes mellitus ([place]) - Primary   Relevant Orders   Bayer DCA Hb A1c Waived   CMP14+EGFR   CBC with Differential/Platelet   Lipid panel     Other   Morbid obesity ([place])   Relevant Orders   Lipid panel    Other Visit Diagnoses    Osteoarthritis of right knee, unspecified osteoarthritis type       Relevant Medications   methylPREDNISolone acetate (DEPO-MEDROL) injection 80 mg (Start on 08/29/2017  5:00 PM)   methylPREDNISolone acetate (DEPO-MEDROL) injection 80 mg (Start on 08/29/2017  5:00 PM)   Osteoarthritis of left knee, unspecified osteoarthritis type       Relevant Medications   methylPREDNISolone acetate (DEPO-MEDROL) injection 80 mg (Start on 08/29/2017  5:00 PM)   methylPREDNISolone acetate (DEPO-MEDROL) injection 80 mg (Start on 08/29/2017  5:00 PM)   Nickel allergy       Relevant Orders   Ambulatory referral to Allergy       Follow up plan: Return in about 4 months (around 12/28/2017), or if symptoms worsen or fail to improve, for Diabetes and hypertension follow-up.  Counseling provided for all of the vaccine components Orders Placed This Encounter  Procedures  . Bayer DCA Hb A1c Waived  . CMP14+EGFR  . CBC with Differential/Platelet  . Lipid panel    Caryl Pina, MD [facility] 08/29/2017, 4:26 PM

## 2017-08-29 NOTE — Addendum Note (Signed)
Addended by: Arville CareETTINGER, JOSHUA on: 08/29/2017 05:00 PM   Modules accepted: Orders

## 2017-08-30 LAB — CBC WITH DIFFERENTIAL/PLATELET
BASOS: 0 %
Basophils Absolute: 0 10*3/uL (ref 0.0–0.2)
EOS (ABSOLUTE): 0.2 10*3/uL (ref 0.0–0.4)
EOS: 2 %
HEMATOCRIT: 32.1 % — AB (ref 34.0–46.6)
HEMOGLOBIN: 10 g/dL — AB (ref 11.1–15.9)
IMMATURE GRANS (ABS): 0 10*3/uL (ref 0.0–0.1)
Immature Granulocytes: 0 %
LYMPHS ABS: 2.5 10*3/uL (ref 0.7–3.1)
LYMPHS: 21 %
MCH: 22.3 pg — AB (ref 26.6–33.0)
MCHC: 31.2 g/dL — ABNORMAL LOW (ref 31.5–35.7)
MCV: 72 fL — AB (ref 79–97)
MONOCYTES: 5 %
Monocytes Absolute: 0.6 10*3/uL (ref 0.1–0.9)
NEUTROS ABS: 8.3 10*3/uL — AB (ref 1.4–7.0)
Neutrophils: 72 %
Platelets: 253 10*3/uL (ref 150–379)
RBC: 4.48 x10E6/uL (ref 3.77–5.28)
RDW: 19.8 % — ABNORMAL HIGH (ref 12.3–15.4)
WBC: 11.7 10*3/uL — ABNORMAL HIGH (ref 3.4–10.8)

## 2017-08-30 LAB — CMP14+EGFR
A/G RATIO: 1.9 (ref 1.2–2.2)
ALBUMIN: 4.3 g/dL (ref 3.5–5.5)
ALT: 27 IU/L (ref 0–32)
AST: 18 IU/L (ref 0–40)
Alkaline Phosphatase: 135 IU/L — ABNORMAL HIGH (ref 39–117)
BUN / CREAT RATIO: 11 (ref 9–23)
BUN: 8 mg/dL (ref 6–20)
Bilirubin Total: <0.2 mg/dL (ref 0.0–1.2)
CALCIUM: 9.4 mg/dL (ref 8.7–10.2)
CO2: 24 mmol/L (ref 20–29)
Chloride: 99 mmol/L (ref 96–106)
Creatinine, Ser: 0.71 mg/dL (ref 0.57–1.00)
GFR, EST AFRICAN AMERICAN: 134 mL/min/{1.73_m2} (ref >59)
GFR, EST NON AFRICAN AMERICAN: 116 mL/min/{1.73_m2} (ref >59)
GLUCOSE: 130 mg/dL — AB (ref 65–99)
Globulin, Total: 2.3 g/dL (ref 1.5–4.5)
Potassium: 4.3 mmol/L (ref 3.5–5.2)
Sodium: 137 mmol/L (ref 134–144)
TOTAL PROTEIN: 6.6 g/dL (ref 6.0–8.5)

## 2017-08-30 LAB — LIPID PANEL
CHOL/HDL RATIO: 4.3 ratio (ref 0.0–4.4)
CHOLESTEROL TOTAL: 134 mg/dL (ref 100–199)
HDL: 31 mg/dL — ABNORMAL LOW (ref >39)
LDL CALC: 68 mg/dL (ref 0–99)
Triglycerides: 177 mg/dL — ABNORMAL HIGH (ref 0–149)
VLDL CHOLESTEROL CAL: 35 mg/dL (ref 5–40)

## 2017-09-05 ENCOUNTER — Other Ambulatory Visit: Payer: Self-pay | Admitting: Family Medicine

## 2017-09-05 DIAGNOSIS — F419 Anxiety disorder, unspecified: Principal | ICD-10-CM

## 2017-09-05 DIAGNOSIS — F329 Major depressive disorder, single episode, unspecified: Secondary | ICD-10-CM

## 2017-09-11 ENCOUNTER — Encounter: Payer: Self-pay | Admitting: Family Medicine

## 2017-09-11 DIAGNOSIS — F339 Major depressive disorder, recurrent, unspecified: Secondary | ICD-10-CM

## 2017-09-15 NOTE — Telephone Encounter (Signed)
Put in referral for psychiatry for patient because she would like to see a different psychiatrist than who she has had Heidi CareJoshua Casara Perrier, MD [facility] 09/15/2017, 8:51 AM

## 2017-09-21 ENCOUNTER — Encounter: Payer: Self-pay | Admitting: Family Medicine

## 2017-09-22 ENCOUNTER — Ambulatory Visit (INDEPENDENT_AMBULATORY_CARE_PROVIDER_SITE_OTHER): Payer: Medicaid Other | Admitting: Family Medicine

## 2017-09-22 ENCOUNTER — Other Ambulatory Visit: Payer: Self-pay

## 2017-09-22 ENCOUNTER — Encounter: Payer: Self-pay | Admitting: Family Medicine

## 2017-09-22 VITALS — BP 131/81 | HR 101 | Temp 99.0°F | Ht 68.0 in | Wt 299.2 lb

## 2017-09-22 DIAGNOSIS — F329 Major depressive disorder, single episode, unspecified: Secondary | ICD-10-CM | POA: Diagnosis not present

## 2017-09-22 DIAGNOSIS — F419 Anxiety disorder, unspecified: Principal | ICD-10-CM

## 2017-09-22 DIAGNOSIS — F325 Major depressive disorder, single episode, in full remission: Secondary | ICD-10-CM

## 2017-09-22 MED ORDER — DESVENLAFAXINE SUCCINATE ER 50 MG PO TB24: 150.0000 mg | ORAL_TABLET | Freq: Every day | ORAL | 1 refills | Status: DC

## 2017-09-22 MED ORDER — ALPRAZOLAM 0.25 MG PO TABS: 0.1250 mg | ORAL_TABLET | Freq: Two times a day (BID) | ORAL | 2 refills | Status: DC | PRN

## 2017-09-22 NOTE — Progress Notes (Signed)
BP 131/81   Pulse (!) 101   Temp 99 F (37.2 C) (Oral)   Ht 5\' 8"  (1.727 m)   Wt 299 lb 4 oz (135.7 kg)   LMP 05/28/2017   BMI 45.50 kg/m    Subjective:    Patient ID: Heidi [name], female    DOB: 09/11/1990, 27 y.o.   MRN: [id number]  HPI: Heidi [name] is a 27 y.o. female presenting on 09/22/2017 for Anxiety/Depression (feels that she needs to be switched from Zoloft to something else for a while, thinking she is  building up resistance)   HPI Anxiety and depression recheck Patient is coming in for anxiety depression recheck today.  She feels like the Zoloft is just not working for her anymore and her anxiety and depression are built up to the point where she just cannot take it anymore.  She says she has had thoughts of hurting herself but no suicidal ideations.  She says she has suicidal ideations but no action plan.  She feels like is just built up to the point where she just cannot take it right now and is hopeless but she promises and said she has no plan to do anything to hurt her self.  She says she is too scared to actually hurt herself.  She did call in and we have a referral rolling to a new psychiatrist that we are trying to get her set up with that she can have transportation to them easier than her previous psychiatrist.  She currently is on Zoloft and Zyprexa and Zoloft has done well for her for 2 years but is just not working currently. [facility] 2/9 09/22/2017 08/29/2017 04/24/2017 04/18/2017 03/25/2017  Decreased Interest 3 1 2 1  0  Down, Depressed, Hopeless 3 1 1 1 1   PHQ - 2 Score 6 2 3 2 1   Altered sleeping 3 1 1 2  -  Tired, decreased energy 3 1 3 2  -  Change in appetite 3 1 2 1  -  Feeling bad or failure about yourself  3 1 1  0 -  Trouble concentrating 3 1 0 2 -  Moving slowly or fidgety/restless 1 0 0 0 -  Suicidal thoughts 3 0 0 0 -  PHQ-9 Score 25 7 10 9  -  Difficult doing work/chores Extremely dIfficult Somewhat difficult Somewhat difficult - -    Some recent data might be hidden     Relevant past medical, surgical, family and social history reviewed and updated as indicated. Interim medical history since our last visit reviewed. Allergies and medications reviewed and updated.  Review of Systems  Constitutional: Negative for chills and fever.  Eyes: Negative for visual disturbance.  Respiratory: Negative for chest tightness and shortness of breath.   Cardiovascular: Negative for chest pain and leg swelling.  Musculoskeletal: Negative for back pain and gait problem.  Skin: Negative for rash.  Neurological: Negative for dizziness, light-headedness and headaches.  Psychiatric/Behavioral: Positive for decreased concentration and dysphoric mood. Negative for agitation, behavioral problems, self-injury, sleep disturbance and suicidal ideas. The patient is nervous/anxious.   All other systems reviewed and are negative.   Per HPI unless specifically indicated above        Objective:    BP 131/81   Pulse (!) 101   Temp 99 F (37.2 C) (Oral)   Ht 5\' 8"  (1.727 m)   Wt 299 lb 4 oz (135.7 kg)   LMP 05/28/2017   BMI 45.50 kg/m   Wt Readings from Last  3 Encounters:  09/22/17 299 lb 4 oz (135.7 kg)  08/29/17 (!) 302 lb (137 kg)  08/27/17 (!) 304 lb (137.9 kg)    Physical Exam  Constitutional: She is oriented to person, place, and time. She appears well-developed and well-nourished. No distress.  Eyes: Conjunctivae are normal.  Neck: Neck supple. No thyromegaly present.  Cardiovascular: Normal rate, regular rhythm, normal heart sounds and intact distal pulses.  No murmur heard. Pulmonary/Chest: Effort normal and breath sounds normal. No respiratory distress. She has no wheezes.  Musculoskeletal: Normal range of motion.  Lymphadenopathy:    She has no cervical adenopathy.  Neurological: She is alert and oriented to person, place, and time. Coordination normal.  Skin: Skin is warm and dry. No rash noted. She is not  diaphoretic.  Psychiatric: Her behavior is normal. Judgment normal. Her mood appears anxious. She exhibits a depressed mood. She expresses no suicidal ideation. She expresses no suicidal plans.  Nursing note and vitals reviewed.       Assessment & Plan:   Problem List Items Addressed This Visit      Other   Anxiety and depression   Relevant Medications   ALPRAZolam (XANAX) 0.25 MG tablet   desvenlafaxine (PRISTIQ) 50 MG 24 hr tablet   Depression, major, in remission (HCC) - Primary   Relevant Medications   ALPRAZolam (XANAX) 0.25 MG tablet   desvenlafaxine (PRISTIQ) 50 MG 24 hr tablet       Follow up plan: Return in about 3 weeks (around 10/13/2017), or if symptoms worsen or fail to improve, for Depression and anxiety recheck.  Counseling provided for all of the vaccine components No orders of the defined types were placed in this encounter.   Arville Care, MD [facility] 09/22/2017, 4:03 PM

## 2017-09-24 NOTE — Telephone Encounter (Signed)
Patient was seen by Dr. Louanne Skyeettinger in afternoon to discuss.

## 2017-09-26 ENCOUNTER — Telehealth: Payer: Self-pay

## 2017-09-26 ENCOUNTER — Other Ambulatory Visit: Payer: Self-pay | Admitting: Family Medicine

## 2017-09-26 DIAGNOSIS — M545 Low back pain, unspecified: Secondary | ICD-10-CM

## 2017-09-26 DIAGNOSIS — G8929 Other chronic pain: Secondary | ICD-10-CM

## 2017-09-26 NOTE — Telephone Encounter (Signed)
[facility] left message 

## 2017-10-02 ENCOUNTER — Encounter: Payer: Self-pay | Admitting: Family Medicine

## 2017-10-08 ENCOUNTER — Other Ambulatory Visit: Payer: Self-pay | Admitting: Family Medicine

## 2017-10-15 ENCOUNTER — Telehealth: Payer: Self-pay

## 2017-10-15 DIAGNOSIS — F419 Anxiety disorder, unspecified: Secondary | ICD-10-CM

## 2017-10-15 DIAGNOSIS — F329 Major depressive disorder, single episode, unspecified: Secondary | ICD-10-CM

## 2017-10-15 DIAGNOSIS — F32A Depression, unspecified: Secondary | ICD-10-CM

## 2017-10-15 DIAGNOSIS — F325 Major depressive disorder, single episode, in full remission: Secondary | ICD-10-CM

## 2017-10-15 NOTE — Progress Notes (Signed)
[place] [facility] Telephone Follow-up  MRN: [id number] NAME: Heidi [name] Date: 10/15/17 Time of Assessment: 4:47 PM Call number: 1/6  Reason for call today: Reason for Contact: Initial Assessment  PHQ-9 Scores:  [facility] 10/15/2017 09/22/2017 08/29/2017 04/24/2017 04/18/2017  Decreased Interest 3 3 1 2 1   Down, Depressed, Hopeless 3 3 1 1 1   PHQ - 2 Score 6 6 2 3 2   Altered sleeping 1 3 1 1 2   Tired, decreased energy 2 3 1 3 2   Change in appetite 1 3 1 2 1   Feeling bad or failure about yourself  3 3 1 1  0  Trouble concentrating 2 3 1  0 2  Moving slowly or fidgety/restless 0 1 0 0 0  Suicidal thoughts 0 3 0 0 0  PHQ-9 Score 15 25 7 10 9   Difficult doing work/chores - Extremely dIfficult Somewhat difficult Somewhat difficult -  Some recent data might be hidden    .Marland Kitchen. GAD 7 : Generalized Anxiety Score 10/15/2017  Nervous, Anxious, on Edge 0  Control/stop worrying 0  Worry too much - different things 0  Trouble relaxing 0  Restless 0  Easily annoyed or irritable 0  Afraid - awful might happen 0  Total GAD 7 Score 0    Stress Current stressors: Current Stressors: Job loss/unemployment Sleep: Sleep: No problems Appetite: Appetite: Decreased Coping ability: Coping ability: Overwhelmed Patient taking medications as prescribed: Patient taking medications as prescribed: Yes     Current medications:  Outpatient Encounter Medications as of 10/15/2017  Medication Sig  . albuterol (PROVENTIL HFA;VENTOLIN HFA) 108 (90 Base) MCG/ACT inhaler Inhale 2 puffs into the lungs every 6 (six) hours as needed for wheezing or shortness of breath.  . ALPRAZolam (XANAX) 0.25 MG tablet Take 0.5-1 tablets (0.125-0.25 mg total) by mouth 2 (two) times daily as needed for anxiety (depends on anxiety if takes 05-0 tablet).  Marland Kitchen. amLODipine (NORVASC) 5 MG tablet TAKE 1 TABLET BY MOUTH ONCE DAILY  . cyclobenzaprine (FLEXERIL) 10 MG tablet TAKE 1 TABLET BY MOUTH THREE TIMES DAILY AS  NEEDED FOR MUSCLE SPASMS  . desvenlafaxine (PRISTIQ) 50 MG 24 hr tablet Take 3 tablets (150 mg total) by mouth daily.  Marland Kitchen. dexlansoprazole (DEXILANT) 60 MG capsule Take 1 capsule (60 mg total) by mouth daily.  Marland Kitchen. glucose blood (ACCU-CHEK GUIDE) test strip Use to check BG once daily at varying times of day.  . Lancets 30G MISC Dispense Accu-Chek guide lancets.  Check blood sugar at varying times once per day.  . lisinopril (PRINIVIL,ZESTRIL) 20 MG tablet TAKE ONE TABLET BY MOUTH ONCE DAILY  . meloxicam (MOBIC) 7.5 MG tablet TAKE 2 TABLETS BY MOUTH ONCE DAILY  . metFORMIN (GLUCOPHAGE) 1000 MG tablet Take 1,000 mg by mouth 2 (two) times daily with a meal.  . Multiple Vitamin (MULTIVITAMIN) tablet Take 1 tablet by mouth daily.  Marland Kitchen. OLANZapine (ZYPREXA) 10 MG tablet TAKE 1 TABLET BY MOUTH AT BEDTIME  . traZODone (DESYREL) 50 MG tablet Take 1 tablet (50 mg total) by mouth at bedtime as needed for sleep.   No facility-administered encounter medications on file as of 10/15/2017.     Self-harm Behaviors Risk Assessment Self-harm risk factors:   Patient endorses recent thoughts of harming self: Have you recently had any thoughts about harming yourself?: No  [place] Suicide Severity Rating Scale: No flowsheet data found. No flowsheet data found.   Danger to Others Risk Assessment Danger to others risk factors: Danger to Others Risk Factors:  No risk factors noted Patient endorses recent thoughts of harming others: Notification required: No need or identified person  Dynamic Appraisal of Situational Aggression (DASA): No flowsheet data found.    Goals, Interventions and Follow-up Plan  Goal:  Patient will reduce depressive episodes due to the loss of her job and car as evidenced by a reduction in the PHQ-9 and the GAD-7 score.   Intervention: Clinical research associate provided patient with community resources at [employer] in order to assist the patient in having a car donated to her if she qualifies.  Writer will  obtain a list of local food pantries that will assist the patient.  Writer discussed becoming involved in one extracurricular activity in an order to decrease depression.   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  Patient reports that she is not able to obtain a job due to not having a car to get her to work.  Patient reports that she lives in [place] and she is not there is no public transportation. Patient reports that she has not worked since March 2018. Patient reports that she had a hysterectomy in 2017.   Patient reports a history of mental illness.  Patient reports that she was diagnosed with Major Depression Disorder and Anxiety Disorder and she is not able to remember the names of her psychiatric medications.  Patient denies any side effects on her current medications.   Patient reports side effects in the past from other psychiatric medications.  Patient reports that she is not able to remember the names of the medications.    Patient reports that she is able to sleep through the night because is now meditating before she goes to bed at night.  Patient reports that she only eats once a day due to limited finances.  Patient reports that she wants to make sure that there is enough food for her 87-year old son and her live in boyfriends.  Patient reports that her boyfriend is the only person working in the home. Therefore, he has to have more food in order to work to support the family. Patient reports that she is single but she lives boyfriend of three years.    Patient denies a history of mania.  Patient denies decreased need for sleep. Patient denies euphoria.  Patient past suicide ideations. Patient denies any past or present self-injurious behaviors.  Patient reports a family  history of mental illness.  Patient denies a family history of substance abuse.  Patient denies a family history of suicide. Patient denies a history of substance abuse. Patient denies DUI.  Patient denies SI/HI.  Patient denies a history of violence.  Patient denies AH/VH/paranoia.  Patient denies physical, sexual or emotional abuse.    Patient reports that she has been in a psychiatric hospital in the past.  Her most recent hospitalization was in 2015.  Patient is not able to remember the names of the facilities or the years in which she was hospitalized.  Patient reports a history outpatient therapy at [facility].   Patient reports that she likes to crochet, spend time with her son, sing and smooth stones.    Phillip Heal LaVerne, LCAS-A

## 2017-10-15 NOTE — Telephone Encounter (Signed)
[facility]  - Writer left a message on 10-10-2017 and 10-15-2017

## 2017-10-17 ENCOUNTER — Encounter (HOSPITAL_COMMUNITY): Payer: Self-pay | Admitting: Psychiatry

## 2017-10-17 ENCOUNTER — Encounter: Payer: Self-pay | Admitting: Family Medicine

## 2017-10-20 ENCOUNTER — Ambulatory Visit: Payer: Medicaid Other | Admitting: Family Medicine

## 2017-10-20 ENCOUNTER — Encounter: Payer: Self-pay | Admitting: Family Medicine

## 2017-10-20 VITALS — BP 154/91 | HR 79 | Temp 97.1°F | Ht 68.0 in | Wt 302.8 lb

## 2017-10-20 DIAGNOSIS — E1159 Type 2 diabetes mellitus with other circulatory complications: Secondary | ICD-10-CM | POA: Diagnosis not present

## 2017-10-20 DIAGNOSIS — F419 Anxiety disorder, unspecified: Secondary | ICD-10-CM

## 2017-10-20 DIAGNOSIS — D72828 Other elevated white blood cell count: Secondary | ICD-10-CM | POA: Diagnosis not present

## 2017-10-20 DIAGNOSIS — F325 Major depressive disorder, single episode, in full remission: Secondary | ICD-10-CM | POA: Diagnosis not present

## 2017-10-20 DIAGNOSIS — I10 Essential (primary) hypertension: Secondary | ICD-10-CM

## 2017-10-20 DIAGNOSIS — F329 Major depressive disorder, single episode, unspecified: Secondary | ICD-10-CM

## 2017-10-20 DIAGNOSIS — F32A Depression, unspecified: Secondary | ICD-10-CM

## 2017-10-20 MED ORDER — LISINOPRIL-HYDROCHLOROTHIAZIDE 20-12.5 MG PO TABS: 1.0000 | ORAL_TABLET | Freq: Every day | ORAL | 3 refills | Status: DC

## 2017-10-20 MED ORDER — BUPROPION HCL ER (XL) 150 MG PO TB24: 150.0000 mg | ORAL_TABLET | Freq: Every day | ORAL | 2 refills | Status: DC

## 2017-10-20 NOTE — Progress Notes (Signed)
BP (!) 154/91   Pulse 79   Temp (!) 97.1 F (36.2 C) (Oral)   Ht 5\' 8"  (1.727 m)   Wt (!) 302 lb 12.8 oz (137.3 kg)   LMP 05/28/2017   BMI 46.04 kg/m    Subjective:    Patient ID: Heidi [name], female    DOB: 03/03/1991, 27 y.o.   MRN: [id number]  HPI: Heidi [name] is a 27 y.o. female presenting on 10/20/2017 for Depression (3 week follow up. Patient states that he medication has helped some. )   HPI Depression and anxiety recheck Patient is coming in today for depression and anxiety recheck.  She says she is doing better on the Pristiq but not completely where she wants to be.  She would like to add Wellbutrin back to her regiment and wanted to know if that was okay and safe to do.  She denies any suicidal ideations or feelings of major depression and has been doing a lot better than what she was previously a few weeks ago.  Her boyfriend who is here with her and agrees. Depression screen [facility] 2/9 10/20/2017 10/15/2017 09/22/2017 08/29/2017 04/24/2017  Decreased Interest 1 3 3 1 2   Down, Depressed, Hopeless 2 3 3 1 1   PHQ - 2 Score 3 6 6 2 3   Altered sleeping 1 1 3 1 1   Tired, decreased energy 1 2 3 1 3   Change in appetite 1 1 3 1 2   Feeling bad or failure about yourself  2 3 3 1 1   Trouble concentrating 1 2 3 1  0  Moving slowly or fidgety/restless 0 0 1 0 0  Suicidal thoughts 1 0 3 0 0  PHQ-9 Score 10 15 25 7 10   Difficult doing work/chores - - Extremely dIfficult Somewhat difficult Somewhat difficult  Some recent data might be hidden     Hypertension Patient is currently on lisinopril, and their blood pressure today is 154/91. Patient denies any lightheadedness or dizziness. Patient denies headaches, blurred vision, chest pains, shortness of breath, or weakness. Denies any side effects from medication and is content with current medication.   Relevant past medical, surgical, family and social history reviewed and updated as indicated. Interim medical history since our last  visit reviewed. Allergies and medications reviewed and updated.  Review of Systems  Constitutional: Negative for chills and fever.  Eyes: Negative for visual disturbance.  Respiratory: Negative for chest tightness and shortness of breath.   Cardiovascular: Negative for chest pain and leg swelling.  Musculoskeletal: Negative for back pain and gait problem.  Skin: Negative for rash.  Neurological: Negative for light-headedness and headaches.  Psychiatric/Behavioral: Positive for decreased concentration and dysphoric mood. Negative for agitation, behavioral problems, self-injury, sleep disturbance and suicidal ideas. The patient is nervous/anxious.   All other systems reviewed and are negative.   Per HPI unless specifically indicated above   Allergies as of 10/20/2017      Reactions   Hydroxyzine    Causes her more anxiety.   Linzess [linaclotide] Hives   Lorazepam    Sedation   Morphine And Related Other (See Comments)   Makes pt.aggressive   Quetiapine    Sweating, irritability      Medication List        Accurate as of 10/20/17  3:33 PM. Always use your most recent med list.          albuterol 108 (90 Base) MCG/ACT inhaler Commonly known as:  PROVENTIL HFA;VENTOLIN HFA Inhale 2 puffs into  the lungs every 6 (six) hours as needed for wheezing or shortness of breath.   ALPRAZolam 0.25 MG tablet Commonly known as:  XANAX Take 0.5-1 tablets (0.125-0.25 mg total) by mouth 2 (two) times daily as needed for anxiety (depends on anxiety if takes 05-0 tablet).   amLODipine 5 MG tablet Commonly known as:  NORVASC TAKE 1 TABLET BY MOUTH ONCE DAILY   buPROPion 150 MG 24 hr tablet Commonly known as:  WELLBUTRIN XL Take 1 tablet (150 mg total) by mouth daily.   cyclobenzaprine 10 MG tablet Commonly known as:  FLEXERIL TAKE 1 TABLET BY MOUTH THREE TIMES DAILY AS NEEDED FOR MUSCLE SPASMS   desvenlafaxine 50 MG 24 hr tablet Commonly known as:  PRISTIQ Take 3 tablets (150 mg  total) by mouth daily.   dexlansoprazole 60 MG capsule Commonly known as:  DEXILANT Take 1 capsule (60 mg total) by mouth daily.   glucose blood test strip Commonly known as:  ACCU-CHEK GUIDE Use to check BG once daily at varying times of day.   Lancets 30G Misc Dispense Accu-Chek guide lancets.  Check blood sugar at varying times once per day.   lisinopril 20 MG tablet Commonly known as:  PRINIVIL,ZESTRIL TAKE ONE TABLET BY MOUTH ONCE DAILY   lisinopril-hydrochlorothiazide 20-12.5 MG tablet Commonly known as:  ZESTORETIC Take 1 tablet by mouth daily.   meloxicam 7.5 MG tablet Commonly known as:  MOBIC TAKE 2 TABLETS BY MOUTH ONCE DAILY   metFORMIN 1000 MG tablet Commonly known as:  GLUCOPHAGE Take 1,000 mg by mouth 2 (two) times daily with a meal.   multivitamin tablet Take 1 tablet by mouth daily.   OLANZapine 10 MG tablet Commonly known as:  ZYPREXA TAKE 1 TABLET BY MOUTH AT BEDTIME   traZODone 50 MG tablet Commonly known as:  DESYREL Take 1 tablet (50 mg total) by mouth at bedtime as needed for sleep.          Objective:    BP (!) 154/91   Pulse 79   Temp (!) 97.1 F (36.2 C) (Oral)   Ht 5\' 8"  (1.727 m)   Wt (!) 302 lb 12.8 oz (137.3 kg)   LMP 05/28/2017   BMI 46.04 kg/m   Wt Readings from Last 3 Encounters:  10/20/17 (!) 302 lb 12.8 oz (137.3 kg)  09/22/17 299 lb 4 oz (135.7 kg)  08/29/17 (!) 302 lb (137 kg)    Physical Exam  Constitutional: She is oriented to person, place, and time. She appears well-developed and well-nourished. No distress.  Eyes: Conjunctivae are normal.  Neck: Neck supple. No thyromegaly present.  Cardiovascular: Normal rate, regular rhythm, normal heart sounds and intact distal pulses.  No murmur heard. Pulmonary/Chest: Effort normal and breath sounds normal. No respiratory distress. She has no wheezes. She has no rales.  Musculoskeletal: Normal range of motion.  Lymphadenopathy:    She has no cervical adenopathy.    Neurological: She is alert and oriented to person, place, and time. Coordination normal.  Skin: Skin is warm and dry. No rash noted. She is not diaphoretic.  Psychiatric: She has a normal mood and affect. Her behavior is normal.  Nursing note and vitals reviewed.       Assessment & Plan:   Problem List Items Addressed This Visit      Cardiovascular and Mediastinum   Hypertension associated with diabetes (HCC)   Relevant Medications   lisinopril-hydrochlorothiazide (ZESTORETIC) 20-12.5 MG tablet     Other   Anxiety and  depression   Relevant Medications   buPROPion (WELLBUTRIN XL) 150 MG 24 hr tablet   Depression, major, in remission (HCC) - Primary   Relevant Medications   buPROPion (WELLBUTRIN XL) 150 MG 24 hr tablet    Other Visit Diagnoses    Other elevated white blood cell (WBC) count       Relevant Orders   CBC with Differential/Platelet   C-reactive protein     Patient wants a recheck of her white count which had improved on previous one because of her history of issues with it.  Follow up plan: Return in about 2 months (around 12/18/2017), or if symptoms worsen or fail to improve, for Depression and anxiety and hypertension recheck.  Counseling provided for all of the vaccine components No orders of the defined types were placed in this encounter.   Arville Care, MD [facility] 10/20/2017, 3:33 PM

## 2017-10-21 LAB — CBC WITH DIFFERENTIAL/PLATELET
BASOS: 0 %
Basophils Absolute: 0 10*3/uL (ref 0.0–0.2)
EOS (ABSOLUTE): 0.2 10*3/uL (ref 0.0–0.4)
EOS: 2 %
HEMATOCRIT: 34.3 % (ref 34.0–46.6)
Hemoglobin: 10.5 g/dL — ABNORMAL LOW (ref 11.1–15.9)
Immature Grans (Abs): 0 10*3/uL (ref 0.0–0.1)
Immature Granulocytes: 0 %
LYMPHS ABS: 2.4 10*3/uL (ref 0.7–3.1)
Lymphs: 23 %
MCH: 23.6 pg — ABNORMAL LOW (ref 26.6–33.0)
MCHC: 30.6 g/dL — AB (ref 31.5–35.7)
MCV: 77 fL — AB (ref 79–97)
Monocytes Absolute: 0.3 10*3/uL (ref 0.1–0.9)
Monocytes: 3 %
Neutrophils Absolute: 7.3 10*3/uL — ABNORMAL HIGH (ref 1.4–7.0)
Neutrophils: 72 %
Platelets: 270 10*3/uL (ref 150–379)
RBC: 4.44 x10E6/uL (ref 3.77–5.28)
RDW: 17.4 % — AB (ref 12.3–15.4)
WBC: 10.3 10*3/uL (ref 3.4–10.8)

## 2017-10-21 LAB — C-REACTIVE PROTEIN: CRP: 7.5 mg/L — ABNORMAL HIGH (ref 0.0–4.9)

## 2017-10-22 NOTE — Telephone Encounter (Signed)
This encounter was created in error - please disregard.

## 2017-10-22 NOTE — Progress Notes (Signed)
[facility] Psychiatric Consultant Case Review   Summary Heidi [name] is a 27 y.o. year old female with history of depression, type II diabetes, hypertension, status post supracervical hysterectomywith bilateral salpingectomy.  Pristiq was started on 1/21 per chart.  Functional Impairment: Psychosocial factors: unemployment, financial strain, has a 71five year old son  Current Medications Current Outpatient Medications on File Prior to Visit  Medication Sig Dispense Refill  . albuterol (PROVENTIL HFA;VENTOLIN HFA) 108 (90 Base) MCG/ACT inhaler Inhale 2 puffs into the lungs every 6 (six) hours as needed for wheezing or shortness of breath. 1 Inhaler 2  . ALPRAZolam (XANAX) 0.25 MG tablet Take 0.5-1 tablets (0.125-0.25 mg total) by mouth 2 (two) times daily as needed for anxiety (depends on anxiety if takes 05-0 tablet). 15 tablet 2  . amLODipine (NORVASC) 5 MG tablet TAKE 1 TABLET BY MOUTH ONCE DAILY 90 tablet 0  . cyclobenzaprine (FLEXERIL) 10 MG tablet TAKE 1 TABLET BY MOUTH THREE TIMES DAILY AS NEEDED FOR MUSCLE SPASMS 30 tablet 2  . desvenlafaxine (PRISTIQ) 50 MG 24 hr tablet Take 3 tablets (150 mg total) by mouth daily. 90 tablet 1  . dexlansoprazole (DEXILANT) 60 MG capsule Take 1 capsule (60 mg total) by mouth daily. 30 capsule 11  . glucose blood (ACCU-CHEK GUIDE) test strip Use to check BG once daily at varying times of day. 100 each 3  . Lancets 30G MISC Dispense Accu-Chek guide lancets.  Check blood sugar at varying times once per day. 100 each 11  . lisinopril (PRINIVIL,ZESTRIL) 20 MG tablet TAKE ONE TABLET BY MOUTH ONCE DAILY 30 tablet 5  . meloxicam (MOBIC) 7.5 MG tablet TAKE 2 TABLETS BY MOUTH ONCE DAILY 60 tablet 0  . metFORMIN (GLUCOPHAGE) 1000 MG tablet Take 1,000 mg by mouth 2 (two) times daily with a meal.    . Multiple Vitamin (MULTIVITAMIN) tablet Take 1 tablet by mouth daily.    Marland Kitchen. OLANZapine (ZYPREXA) 10 MG tablet TAKE 1 TABLET BY MOUTH AT  BEDTIME 90 tablet 0  . traZODone (DESYREL) 50 MG tablet Take 1 tablet (50 mg total) by mouth at bedtime as needed for sleep. 30 tablet 2   No current facility-administered medications on file prior to visit.      Past psychiatry history Outpatient: [facility] in 2016 Psychiatry admission: in 2015 Previous suicide attempt: denies Past trials of medication: sertraline, Pristiq, Xanax, olanzapine History of violence: denies  Current measures [facility] 2[facility] 10/15/2017 09/22/2017 08/29/2017 04/24/2017 04/18/2017  Decreased Interest 3 3 1 2 1   Down, Depressed, Hopeless 3 3 1 1 1   PHQ - 2 Score 6 6 2 3 2   Altered sleeping 1 3 1 1 2   Tired, decreased energy 2 3 1 3 2   Change in appetite 1 3 1 2 1   Feeling bad or failure about yourself  3 3 1 1  0  Trouble concentrating 2 3 1  0 2  Moving slowly or fidgety/restless 0 1 0 0 0  Suicidal thoughts 0 3 0 0 0  PHQ-9 Score 15 25 7 10 9   Difficult doing work/chores - Extremely dIfficult Somewhat difficult Somewhat difficult -  Some recent data might be hidden   GAD 7 : Generalized Anxiety Score 10/15/2017  Nervous, Anxious, on Edge 0  Control/stop worrying 0  Worry too much - different things 0  Trouble relaxing 0  Restless 0  Easily annoyed or irritable 0  Afraid - awful might happen 0  Total GAD 7  Score 0    Goals (patient centered) Getting involved in one extracurricular activity   Assessment/Provisional Diagnosis # MDD, recurrent without psychotic features Given there is improvement in neurovegetative symptoms since starting Prestiq, will recommend continue current dose. Continue olanzapine at this time for adjunctive treatment for depression. Noted that olanzapine has higher risk of causing metabolic side effect. May consider switching to other antipsychotics (Abilify, Geodon, Latuda, which has less metabolic side effect) or taper it down if indicated in the future.   Recommendation - Continue Prestiq 150 mg daily.  Consider uptitration to 200 mg daily if any worsening neurovegetative symptoms.  - Continue olanzapine 10 mg qhs. Monitor metabolic side effect.  - Patient is on trazodone 50 mg qhs, Xanax up to 0.25 mg bidprn for anxiety  - [facility] to discuss available resources.  Thank you for your consult. We will continue to follow the patient. Please contact [facility]  for any questions or concerns.   The above treatment considerations and suggestions are based on consultation with the [facility] specialist and/or PCP and a review of information available in the shared registry and the patient's Electronic Health Record (EHR). I have not personally examined the patient. All recommendations should be implemented with consideration of the patient's relevant prior history and current clinical status. Please feel free to call me with any questions about the care of this patient.

## 2017-10-28 ENCOUNTER — Ambulatory Visit: Payer: Medicaid Other | Admitting: Allergy & Immunology

## 2017-11-03 ENCOUNTER — Other Ambulatory Visit: Payer: Self-pay | Admitting: Family Medicine

## 2017-11-03 DIAGNOSIS — G8929 Other chronic pain: Secondary | ICD-10-CM

## 2017-11-03 DIAGNOSIS — M545 Low back pain: Principal | ICD-10-CM

## 2017-11-05 ENCOUNTER — Other Ambulatory Visit: Payer: Self-pay | Admitting: *Deleted

## 2017-11-05 MED ORDER — OSELTAMIVIR PHOSPHATE 75 MG PO CAPS: 75.0000 mg | ORAL_CAPSULE | Freq: Two times a day (BID) | ORAL | 0 refills | Status: DC

## 2017-11-11 ENCOUNTER — Ambulatory Visit (HOSPITAL_COMMUNITY): Payer: Medicaid Other | Admitting: Psychiatry

## 2017-11-12 ENCOUNTER — Telehealth: Payer: Self-pay

## 2017-11-12 NOTE — Telephone Encounter (Signed)
Error

## 2017-11-21 ENCOUNTER — Ambulatory Visit: Payer: Medicaid Other | Admitting: Family

## 2017-11-21 ENCOUNTER — Encounter: Payer: Self-pay | Admitting: Family

## 2017-11-21 VITALS — BP 113/70 | HR 77 | Temp 97.7°F | Ht 68.0 in | Wt 302.0 lb

## 2017-11-21 DIAGNOSIS — N898 Other specified noninflammatory disorders of vagina: Secondary | ICD-10-CM

## 2017-11-21 DIAGNOSIS — B373 Candidiasis of vulva and vagina: Secondary | ICD-10-CM | POA: Diagnosis not present

## 2017-11-21 DIAGNOSIS — N760 Acute vaginitis: Secondary | ICD-10-CM | POA: Diagnosis not present

## 2017-11-21 DIAGNOSIS — B9689 Other specified bacterial agents as the cause of diseases classified elsewhere: Secondary | ICD-10-CM

## 2017-11-21 DIAGNOSIS — B3731 Acute candidiasis of vulva and vagina: Secondary | ICD-10-CM

## 2017-11-21 DIAGNOSIS — R1030 Lower abdominal pain, unspecified: Secondary | ICD-10-CM

## 2017-11-21 LAB — URINALYSIS, COMPLETE
Bilirubin, UA: NEGATIVE
Glucose, UA: NEGATIVE
Ketones, UA: NEGATIVE
LEUKOCYTES UA: NEGATIVE
Nitrite, UA: NEGATIVE
PROTEIN UA: NEGATIVE
RBC, UA: NEGATIVE
Specific Gravity, UA: 1.02 (ref 1.005–1.030)
Urobilinogen, Ur: 0.2 mg/dL (ref 0.2–1.0)
pH, UA: 8.5 — ABNORMAL HIGH (ref 5.0–7.5)

## 2017-11-21 LAB — WET PREP FOR TRICH, YEAST, CLUE
CLUE CELL EXAM: POSITIVE — AB
Trichomonas Exam: NEGATIVE
Yeast Exam: NEGATIVE

## 2017-11-21 LAB — MICROSCOPIC EXAMINATION
RBC MICROSCOPIC, UA: NONE SEEN /HPF (ref 0–2)
WBC, UA: NONE SEEN /hpf (ref 0–5)

## 2017-11-21 MED ORDER — FLUCONAZOLE 150 MG PO TABS: 150.0000 mg | ORAL_TABLET | ORAL | 0 refills | Status: DC | PRN

## 2017-11-21 MED ORDER — METRONIDAZOLE 500 MG PO TABS: 500.0000 mg | ORAL_TABLET | Freq: Two times a day (BID) | ORAL | 0 refills | Status: DC

## 2017-11-21 NOTE — Progress Notes (Signed)
   Subjective:    Patient ID: Estrella MyrtleJessica Kerce, female    DOB: 12/21/1990, 27 y.o.   MRN: [id number]  Vaginal Discharge  The patient's primary symptoms include vaginal discharge. The patient's pertinent negatives include no genital itching, genital odor, missed menses or vaginal bleeding. This is a new problem. The current episode started in the past 7 days. The problem has been unchanged. Associated symptoms include flank pain, frequency, nausea and urgency. Pertinent negatives include no chills, constipation, diarrhea, discolored urine, dysuria or fever. The vaginal discharge was clear and white. There has been no bleeding.      Review of Systems  Constitutional: Negative for chills and fever.  Gastrointestinal: Positive for nausea. Negative for constipation and diarrhea.  Genitourinary: Positive for flank pain, frequency, urgency and vaginal discharge. Negative for dysuria and missed menses.  All other systems reviewed and are negative.      Objective:   Physical Exam  Constitutional: She is oriented to person, place, and time. She appears well-developed and well-nourished. No distress.  HENT:  Head: Normocephalic and atraumatic.  Right Ear: External ear normal.  Left Ear: External ear normal.  Nose: Nose normal.  Mouth/Throat: Oropharynx is clear and moist.  Eyes: Pupils are equal, round, and reactive to light.  Neck: Normal range of motion. Neck supple. No thyromegaly present.  Cardiovascular: Normal rate, regular rhythm, normal heart sounds and intact distal pulses.  No murmur heard. Pulmonary/Chest: Effort normal and breath sounds normal. No respiratory distress. She has no wheezes.  Abdominal: Soft. Bowel sounds are normal. She exhibits no distension. There is no tenderness.  Musculoskeletal: Normal range of motion. She exhibits no edema or tenderness.  Neurological: She is alert and oriented to person, place, and time.  Skin: Skin is warm and dry.  Psychiatric: She has a  normal mood and affect. Her behavior is normal. Judgment and thought content normal.  Vitals reviewed.    BP 113/70   Pulse 77   Temp 97.7 F (36.5 C) (Oral)   Ht 5\' 8"  (1.727 m)   Wt (!) 302 lb (137 kg)   LMP 05/28/2017   BMI 45.92 kg/m      Assessment & Plan:  1. Vaginal discharge - WET PREP FOR TRICH, YEAST, CLUE  2. Lower abdominal pain - Urinalysis, Complete  3. Bacterial vaginitis Keep clean and dry Probiotic  - metroNIDAZOLE (FLAGYL) 500 MG tablet; Take 1 tablet (500 mg total) by mouth 2 (two) times daily.  Dispense: 14 tablet; Refill: 0  4. Vagina, candidiasis Do not scratch  - fluconazole (DIFLUCAN) 150 MG tablet; Take 1 tablet (150 mg total) by mouth every three (3) days as needed.  Dispense: 3 tablet; Refill: 0     Jannifer Rodneyhristy Maleki Hippe, FNP

## 2017-11-21 NOTE — Patient Instructions (Signed)

## 2017-11-25 ENCOUNTER — Telehealth: Payer: Self-pay

## 2017-11-25 ENCOUNTER — Other Ambulatory Visit: Payer: Self-pay | Admitting: Family Medicine

## 2017-11-25 DIAGNOSIS — F325 Major depressive disorder, single episode, in full remission: Secondary | ICD-10-CM

## 2017-11-25 DIAGNOSIS — F419 Anxiety disorder, unspecified: Secondary | ICD-10-CM

## 2017-11-25 DIAGNOSIS — F329 Major depressive disorder, single episode, unspecified: Secondary | ICD-10-CM

## 2017-11-25 DIAGNOSIS — F32A Depression, unspecified: Secondary | ICD-10-CM

## 2017-11-25 NOTE — Telephone Encounter (Signed)
Getting prior auth from pharmacy for Medicaid for quantity of 3 per day Desvenlafax 50 mg   Need reason that this is clinically indicated for three a day ?

## 2017-11-26 NOTE — Telephone Encounter (Signed)
Patient has already failed both 50 and 100 mg and we were increasing her but they do not make a 150 mg tablet so I sent her 3 -50 mg tablets, I guess the other option if they would rather pay for it is pay for 1 100 mg tablet and 1- 50 but these pills or not able to be broken apart because they are extended release

## 2017-12-04 ENCOUNTER — Other Ambulatory Visit: Payer: Self-pay | Admitting: Family Medicine

## 2017-12-04 DIAGNOSIS — M545 Low back pain: Principal | ICD-10-CM

## 2017-12-04 DIAGNOSIS — I10 Essential (primary) hypertension: Secondary | ICD-10-CM

## 2017-12-04 DIAGNOSIS — G8929 Other chronic pain: Secondary | ICD-10-CM

## 2017-12-10 ENCOUNTER — Telehealth (HOSPITAL_COMMUNITY): Payer: Self-pay

## 2017-12-10 NOTE — Telephone Encounter (Signed)
[facility] - left message.  

## 2017-12-16 ENCOUNTER — Encounter: Payer: Self-pay | Admitting: Allergy & Immunology

## 2017-12-16 ENCOUNTER — Ambulatory Visit: Payer: Medicaid Other | Admitting: Allergy & Immunology

## 2017-12-16 VITALS — BP 122/74 | HR 92 | Temp 98.5°F | Resp 18 | Ht 67.5 in | Wt 298.0 lb

## 2017-12-16 DIAGNOSIS — T50905D Adverse effect of unspecified drugs, medicaments and biological substances, subsequent encounter: Secondary | ICD-10-CM | POA: Diagnosis not present

## 2017-12-16 DIAGNOSIS — J309 Allergic rhinitis, unspecified: Secondary | ICD-10-CM | POA: Diagnosis not present

## 2017-12-16 NOTE — Progress Notes (Signed)
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 Adverse effect of drug (nickel exposure) - Serum plasma nickel level ordered.  - We will get a CRP ordered as well.  - We will call you in 1-2 weeks with the results of the testing.  2. Seasonal allergies - Deyra declined testing today. - It does not seem that this is indicated given her control with Claritin alone.   3. Return if symptoms worsen or fail to improve.  Subjective:   Heidi [name] is a 27 y.o. female presenting today for evaluation of  Chief Complaint  Patient presents with  . Blood Test Request    Patient is requesting a nickel serum test due to a possible allergy to ESURE(birth conrol device). states she experienced abnormal periods/cramping/leg pain/neck pain/tingling in extremities. hot/cold flashes. tooth decay.     Heidi [name] has a history of the following: Patient Active Problem List   Diagnosis Date Noted  . Status post abdominal supracervical subtotal hysterectomy 06/24/2017  . Anal fissure 05/06/2017  . 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 Morbid obesity (HCC) 04/15/2017  . GERD (gastroesophageal reflux disease) 03/13/2017  . Abnormal liver function tests 03/13/2017  . Abdominal pain, epigastric 03/13/2017  . Type 2 diabetes mellitus (HCC) 03/28/2016  . Paresthesia 01/18/2016  . Neuropathic pain 11/10/2015  . Hypertension associated with diabetes (HCC) 07/21/2015  . Anxiety and depression 06/22/2015    History obtained from: chart review and patient.  Heidi [name] was referred by Dettinger, Elige Radon, MD.     Heidi [name] is a 27 y.o. female presenting for concern for nickel toxicity. She had a nickel containing device implanted as a birth control method. She had it placed in February 2014  and the symptoms started by the end of 2014. She developed adverse effects and it has been removed. She reports today that she developed chronic pain from the implant as well as rashes (difficult to describe, but located on hips). She also had abnormal bleeding and cramping as well as "whole body inflammation". Her WBC was "high". She wants to have a nickel serum test to see if there is nickel "left in her blood". It was removed last October 2018. Aside from the osteoarthritis in her knees, her pain was better controlled and her temperature control has been better controlled.  She first had her CRP evaluated by a Neurologist. Then her Rheumatologist also performed the CRP. Her OB/Gyn also ordered a CRP. Evidently, the device that was implanted is meant to trigger a nonsurgical sterilization procedure essentially.  However, the device is made of nickel. Following the procedure in 2014, she immediately developed cramping post procedure.  Her gynecologist at the time did not feel that the symptoms were related to the device, which prompted her to seek a second opinion from her gynecologist in [place].  Due to the pain, she was unable to work for approximately 4 years.  In October 2018, she underwent a hysterectomy and removal of her fallopian tubes to remove the device entirely.  Her symptoms started to clear up after that time. Her currently gynecologist is Dr. Emelda Fear.   She does report sneezing, congestion, and overall nothing that is not cleared up with Claritin. She does have a remote history of asthma that is controlled with out an inhaler. She  denies night time coughing, but she is a smoker for 12 years. She is not interested in allergy testing today.   Heidi [name] does have a history of multiple psychiatric disorders and is on a multitude of medications. Otherwise, there is no history of other atopic diseases, including drug allergies, stinging insect allergies, or urticaria. There is no significant  infectious history. Vaccinations are up to date.    Past Medical History: Patient Active Problem List   Diagnosis Date Noted  . Status post abdominal supracervical subtotal hysterectomy 06/24/2017  . Anal fissure 05/06/2017  . 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 Morbid obesity (HCC) 04/15/2017  . GERD (gastroesophageal reflux disease) 03/13/2017  . Abnormal liver function tests 03/13/2017  . Abdominal pain, epigastric 03/13/2017  . Type 2 diabetes mellitus (HCC) 03/28/2016  . Paresthesia 01/18/2016  . Neuropathic pain 11/10/2015  . Hypertension associated with diabetes (HCC) 07/21/2015  . Anxiety and depression 06/22/2015    Medication List:  Allergies as of 12/16/2017      Reactions   Hydroxyzine    Causes her more anxiety.   Linzess [linaclotide] Hives   Lorazepam    Sedation   Morphine And Related Other (See Comments)   Makes pt.aggressive   Quetiapine    Sweating, irritability      Medication List        Accurate as of 12/16/17  2:35 PM. Always use your most recent med list.          albuterol 108 (90 Base) MCG/ACT inhaler Commonly known as:  PROVENTIL HFA;VENTOLIN HFA Inhale 2 puffs into the lungs every 6 (six) hours as needed for wheezing or shortness of breath.   ALPRAZolam 0.25 MG tablet Commonly known as:  XANAX Take 0.5-1 tablets (0.125-0.25 mg total) by mouth 2 (two) times daily as needed for anxiety (depends on anxiety if takes 05-0 tablet).   amLODipine 5 MG tablet Commonly known as:  NORVASC TAKE 1 TABLET BY MOUTH ONCE DAILY   buPROPion 150 MG 24 hr tablet Commonly known as:  WELLBUTRIN XL Take 1 tablet (150 mg total) by mouth daily.   cyclobenzaprine 10 MG tablet Commonly known as:  FLEXERIL TAKE 1 TABLET BY MOUTH THREE TIMES DAILY AS NEEDED FOR MUSCLE SPASMS   desvenlafaxine 50 MG 24 hr tablet Commonly known as:  PRISTIQ TAKE 3 TABLETS BY MOUTH ONCE DAILY   dexlansoprazole 60 MG capsule Commonly known as:  DEXILANT Take  1 capsule (60 mg total) by mouth daily.   glucose blood test strip Commonly known as:  ACCU-CHEK GUIDE Use to check BG once daily at varying times of day.   Lancets 30G Misc Dispense Accu-Chek guide lancets.  Check blood sugar at varying times once per day.   lisinopril-hydrochlorothiazide 20-12.5 MG tablet Commonly known as:  ZESTORETIC Take 1 tablet by mouth daily.   meloxicam 7.5 MG tablet Commonly known as:  MOBIC TAKE 2 TABLETS BY MOUTH ONCE DAILY   metFORMIN 1000 MG tablet Commonly known as:  GLUCOPHAGE Take 1,000 mg by mouth 2 (two) times daily with a meal.   multivitamin tablet Take 1 tablet by mouth daily.   OLANZapine 10 MG tablet Commonly known as:  ZYPREXA TAKE 1 TABLET BY MOUTH AT BEDTIME   traZODone 50 MG tablet Commonly known as:  DESYREL Take 1 tablet (50 mg total) by mouth at bedtime as needed for sleep.       Birth History: non-contributory.   Developmental History: non-contributory.   Past Surgical History: Past  Surgical History:  Procedure Laterality Date  . BILATERAL SALPINGECTOMY Bilateral 06/24/2017   Procedure: BILATERAL SALPINGECTOMY;  Surgeon: Tilda BurrowFerguson, John V, MD;  Location: [facility];  Service: Gynecology;  Laterality: Bilateral;  . CESAREAN SECTION    . COLONOSCOPY WITH PROPOFOL N/A 03/17/2017   Procedure: COLONOSCOPY WITH PROPOFOL;  Surgeon: Corbin Adeourk, Robert M, MD;  Location: [facility];  Service: Endoscopy;  Laterality: N/A;  9:30am  . DILATION AND CURETTAGE OF UTERUS    . ESOPHAGOGASTRODUODENOSCOPY (EGD) WITH PROPOFOL N/A 03/17/2017   Procedure: ESOPHAGOGASTRODUODENOSCOPY (EGD) WITH PROPOFOL;  Surgeon: Corbin Adeourk, Robert M, MD;  Location: [facility];  Service: Endoscopy;  Laterality: N/A;  . ESSURE TUBAL LIGATION    . SUPRACERVICAL ABDOMINAL HYSTERECTOMY N/A 06/24/2017   Procedure: SUPRACERVICAL ABDOMINAL HYSTERECTOMY, WIDE EXCISION OF CICATRIX;  Surgeon: Tilda BurrowFerguson, John V, MD;  Location: [facility];  Service: Gynecology;  Laterality: N/A;      Family History: Family History  Problem Relation Age of Onset  . Depression Mother   . Hypertension Mother   . Diabetes Mother   . Mental illness Father   . Hypertension Father   . Diabetes Father   . Cancer Maternal Aunt   . Diabetes Paternal Grandmother   . Hypertension Paternal Grandmother   . Arthritis Paternal Grandmother   . Stroke Paternal Grandfather   . Heart disease Paternal Grandfather   . Hypertension Paternal Grandfather   . Diabetes Paternal Grandfather   . Schizophrenia Maternal Grandfather   . Alcohol abuse Maternal Grandfather   . Other Sister        recovering addict  . Schizophrenia Sister   . Irritable bowel syndrome Sister   . Other Sister        recovering addict  . Bipolar disorder Sister   . Seizures Sister   . Colon cancer Neg Hx   . Inflammatory bowel disease Neg Hx   . Celiac disease Neg Hx      Social History: Heidi BumpsJessica lives at home with her 5yo son and boyfriend.  They live in an apartment that is around 29 years old.  There is carpeting throughout the apartment.  They have gas eating and window units for cooling.  There are no animals inside or outside of the home.  There is no dust mite covering on her bed or pillows.  There is no tobacco smoke in the home, but she does smoke in the car.  She smokes 1 pack/day for the past 12 years.  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    Review of Systems: a 14-point review of systems is pertinent for what is mentioned in HPI.  Otherwise, all other systems were negative. Constitutional: negative other than that listed in the HPI Eyes: negative other than that listed in the HPI Ears, nose, mouth, throat, and face: negative other than that listed in the HPI Respiratory: negative other than that listed in the HPI Cardiovascular: negative other than that listed in the HPI Gastrointestinal: negative other than that listed in the HPI Genitourinary: negative other than  that listed in the HPI Integument: negative other than that listed in the HPI Hematologic: negative other than that listed in the HPI Musculoskeletal: negative other than that listed in the HPI Neurological: negative other than that listed in the HPI Allergy/Immunologic: negative other than that listed in the HPI    Objective:   Blood pressure 122/74, pulse 92, temperature 98.5 F (36.9 C), temperature source Oral, resp. rate  18, height 5' 7.5" (1.715 m), weight 298 lb (135.2 kg), last menstrual period 05/28/2017, SpO2 98 %. Body mass index is 45.98 kg/m.   Physical Exam:  General: Alert, interactive, in no acute distress. Matter of fact. Obese female.  Eyes: No conjunctival injection bilaterally, no discharge on the right, no discharge on the left and no Horner-Trantas dots present. PERRL bilaterally. EOMI without pain. No photophobia.  Ears: Right TM pearly gray with normal light reflex, Left TM pearly gray with normal light reflex, Right TM intact without perforation and Left TM intact without perforation.  Nose/Throat: External nose within normal limits and septum midline. Turbinates edematous without discharge. Posterior oropharynx mildly erythematous without cobblestoning in the posterior oropharynx. Tonsils 2+ without exudates.  Tongue without thrush. Neck: Supple without thyromegaly. Trachea midline. Adenopathy: no enlarged lymph nodes appreciated in the anterior cervical, occipital, axillary, epitrochlear, inguinal, or popliteal regions. Lungs: Clear to auscultation without wheezing, rhonchi or rales. No increased work of breathing. CV: Normal S1/S2. No murmurs. Capillary refill <2 seconds.  Abdomen: Nondistended, nontender. No guarding or rebound tenderness. Bowel sounds present in all fields and hypoactive  Skin: Warm and dry, without lesions or rashes. Extremities:  No clubbing, cyanosis or edema. Neuro:   Grossly intact. No focal deficits appreciated. Responsive to  questions.  Diagnostic studies: none      Malachi Bonds, MD [facility]

## 2017-12-16 NOTE — Patient Instructions (Addendum)
1. Adverse effect of drug (nickel exposure) - Serum plasma nickel level ordered.  - We will get a CRP ordered as well.  - We will call you in 1-2 weeks with the results of the testing.  2. Return if symptoms worsen or fail to improve.   Please inform us of any Emergency Department visits, hospitalizations, or changes in symptoms. Call us before going to the ED for breathing or allergy symptoms since we might be able to fit you in for a sick visit. Feel free to contact us anytime with any questions, problems, or concerns.  It was a pleasure to meet you today!  Websites that have reliable patient information: 1. American Academy of Asthma, Allergy, and Immunology: www.aaaai.org 2. Food Allergy Research and Education (FARE): foodallergy.org 3. Mothers of Asthmatics: http://www.asthmacommunitynetwork.org 4. American College of Allergy, Asthma, and Immunology: www.acaai.org    ir

## 2017-12-17 LAB — NICKEL, PLASMA: NICKEL, PLASMA: 3.4 ug/L (ref 0.6–7.5)

## 2017-12-17 LAB — C-REACTIVE PROTEIN: CRP: 21.6 mg/L — ABNORMAL HIGH (ref 0.0–4.9)

## 2017-12-18 ENCOUNTER — Other Ambulatory Visit: Payer: Self-pay | Admitting: Family Medicine

## 2017-12-18 ENCOUNTER — Ambulatory Visit (INDEPENDENT_AMBULATORY_CARE_PROVIDER_SITE_OTHER): Payer: Medicaid Other | Admitting: Family Medicine

## 2017-12-18 ENCOUNTER — Encounter: Payer: Self-pay | Admitting: Family Medicine

## 2017-12-18 VITALS — BP 132/86 | HR 92 | Temp 98.0°F | Ht 67.75 in | Wt 300.0 lb

## 2017-12-18 DIAGNOSIS — R945 Abnormal results of liver function studies: Secondary | ICD-10-CM

## 2017-12-18 DIAGNOSIS — F329 Major depressive disorder, single episode, unspecified: Secondary | ICD-10-CM

## 2017-12-18 DIAGNOSIS — M1711 Unilateral primary osteoarthritis, right knee: Secondary | ICD-10-CM | POA: Diagnosis not present

## 2017-12-18 DIAGNOSIS — E1169 Type 2 diabetes mellitus with other specified complication: Secondary | ICD-10-CM | POA: Diagnosis not present

## 2017-12-18 DIAGNOSIS — M1712 Unilateral primary osteoarthritis, left knee: Secondary | ICD-10-CM

## 2017-12-18 DIAGNOSIS — K2100 Gastro-esophageal reflux disease with esophagitis, without bleeding: Secondary | ICD-10-CM

## 2017-12-18 DIAGNOSIS — F419 Anxiety disorder, unspecified: Principal | ICD-10-CM

## 2017-12-18 DIAGNOSIS — E1159 Type 2 diabetes mellitus with other circulatory complications: Secondary | ICD-10-CM

## 2017-12-18 DIAGNOSIS — R7989 Other specified abnormal findings of blood chemistry: Secondary | ICD-10-CM

## 2017-12-18 DIAGNOSIS — F32A Depression, unspecified: Secondary | ICD-10-CM

## 2017-12-18 DIAGNOSIS — I10 Essential (primary) hypertension: Secondary | ICD-10-CM | POA: Diagnosis not present

## 2017-12-18 DIAGNOSIS — F325 Major depressive disorder, single episode, in full remission: Secondary | ICD-10-CM

## 2017-12-18 MED ORDER — METHYLPREDNISOLONE ACETATE 80 MG/ML IJ SUSP
80.0000 mg | Freq: Once | INTRAMUSCULAR | Status: AC
  Administered 2017-12-18: 80 mg via INTRAMUSCULAR

## 2017-12-18 NOTE — Progress Notes (Signed)
 BP 132/86   Pulse 92   Temp 98 F (36.7 C) (Oral)   Ht 5' 7.75" (1.721 m)   Wt 300 lb (136.1 kg)   LMP 05/28/2017   BMI 45.95 kg/m    Subjective:    Patient ID: Heidi [name], female    DOB: 01/28/1991, 26 y.o.   MRN: [id number]  HPI: Heidi [name] is a 26 y.o. female presenting on 12/18/2017 for Hypertension (2 mo); Depression; and Bilateral knee pain   HPI Type 2 diabetes mellitus Patient comes in today for recheck of his diabetes. Patient has been currently taking metformin. Patient is currently on an ACE inhibitor/ARB. Patient has not seen an ophthalmologist this year. Patient denies any issues with their feet.   Hypertension Patient is currently on lisinopril-hydrochlorothiazide, and their blood pressure today is 132/86. Patient denies any lightheadedness or dizziness. Patient denies headaches, blurred vision, chest pains, shortness of breath, or weakness. Denies any side effects from medication and is content with current medication.   GERD Patient is currently on Dexilant.  She denies any major symptoms or abdominal pain or belching or burping. She denies any blood in her stool or lightheadedness or dizziness.   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  She denies any suicidal ideations or thoughts of hurting herself.  Her anxiety is well under control as well and she feels a lot better since her child is doing better in school currently.  She is currently taking Wellbutrin and Pristiq and alprazolam and olanzapine  Bilateral knee osteoarthritis Patient comes in complaining of bilateral knee osteoarthritis and wants to get joint injections again like she has had previously.  Relevant past medical, surgical, family and social history reviewed and updated  as indicated. Interim medical history since our last visit reviewed. Allergies and medications reviewed and updated.  Review of Systems  Constitutional: Negative for chills and fever.  HENT: Negative for congestion, ear discharge and ear pain.   Eyes: Negative for redness and visual disturbance.  Respiratory: Negative for chest tightness and shortness of breath.   Cardiovascular: Negative for chest pain and leg swelling.  Gastrointestinal: Negative for abdominal pain.  Genitourinary: Negative for difficulty urinating and dysuria.  Musculoskeletal: Positive for arthralgias. Negative for back pain and gait problem.  Skin: Negative for rash.  Neurological: Negative for dizziness, light-headedness and headaches.  Psychiatric/Behavioral: Negative for agitation, behavioral problems, dysphoric mood, self-injury, sleep disturbance and suicidal ideas. The patient is not nervous/anxious.   All other systems reviewed and are negative.   Per HPI unless specifically indicated above   Allergies as of 12/18/2017      Reactions   Hydroxyzine    Causes her more anxiety.   Linzess [linaclotide] Hives   Lorazepam    Sedation   Morphine And Related Other (See Comments)   Makes pt.aggressive   Quetiapine    Sweating, irritability      Medication List        Accurate as of 12/18/17  2:09 PM. Always use your most recent med list.          albuterol 108 (90 Base) MCG/ACT inhaler Commonly known as:  PROVENTIL HFA;VENTOLIN HFA Inhale 2 puffs into the lungs every 6 (six) hours as needed   for wheezing or shortness of breath.   ALPRAZolam 0.25 MG tablet Commonly known as:  XANAX Take 0.5-1 tablets (0.125-0.25 mg total) by mouth 2 (two) times daily as needed for anxiety (depends on anxiety if takes 05-0 tablet).   amLODipine 5 MG tablet Commonly known as:  NORVASC TAKE 1 TABLET BY MOUTH ONCE DAILY   buPROPion 150 MG 24 hr tablet Commonly known as:  WELLBUTRIN XL Take 1 tablet (150 mg total) by  mouth daily.   cyclobenzaprine 10 MG tablet Commonly known as:  FLEXERIL TAKE 1 TABLET BY MOUTH THREE TIMES DAILY AS NEEDED FOR MUSCLE SPASMS   desvenlafaxine 50 MG 24 hr tablet Commonly known as:  PRISTIQ TAKE 3 TABLETS BY MOUTH ONCE DAILY   dexlansoprazole 60 MG capsule Commonly known as:  DEXILANT Take 1 capsule (60 mg total) by mouth daily.   glucose blood test strip Commonly known as:  ACCU-CHEK GUIDE Use to check BG once daily at varying times of day.   Lancets 30G Misc Dispense Accu-Chek guide lancets.  Check blood sugar at varying times once per day.   lisinopril-hydrochlorothiazide 20-12.5 MG tablet Commonly known as:  ZESTORETIC Take 1 tablet by mouth daily.   meloxicam 7.5 MG tablet Commonly known as:  MOBIC TAKE 2 TABLETS BY MOUTH ONCE DAILY   metFORMIN 1000 MG tablet Commonly known as:  GLUCOPHAGE Take 1,000 mg by mouth 2 (two) times daily with a meal.   multivitamin tablet Take 1 tablet by mouth daily.   OLANZapine 10 MG tablet Commonly known as:  ZYPREXA TAKE 1 TABLET BY MOUTH AT BEDTIME   traZODone 50 MG tablet Commonly known as:  DESYREL Take 1 tablet (50 mg total) by mouth at bedtime as needed for sleep.          Objective:    BP 132/86   Pulse 92   Temp 98 F (36.7 C) (Oral)   Ht 5' 7.75" (1.721 m)   Wt 300 lb (136.1 kg)   LMP 05/28/2017   BMI 45.95 kg/m   Wt Readings from Last 3 Encounters:  12/18/17 300 lb (136.1 kg)  12/16/17 298 lb (135.2 kg)  11/21/17 (!) 302 lb (137 kg)    Physical Exam  Constitutional: She is oriented to person, place, and time. She appears well-developed and well-nourished. No distress.  Eyes: Pupils are equal, round, and reactive to light. Conjunctivae and EOM are normal.  Cardiovascular: Normal rate, regular rhythm, normal heart sounds and intact distal pulses.  No murmur heard. Pulmonary/Chest: Effort normal and breath sounds normal. No respiratory distress. She has no wheezes.  Musculoskeletal:  Normal range of motion. She exhibits tenderness (Chronic bilateral knee medial joint tenderness). She exhibits no edema.  Neurological: She is alert and oriented to person, place, and time. Coordination normal.  Skin: Skin is warm and dry. No rash noted. She is not diaphoretic.  Psychiatric: She has a normal mood and affect. Her behavior is normal.  Nursing note and vitals reviewed.   Knee injection x2: Consent form signed. Risk factors of bleeding and infection discussed with patient and patient is agreeable towards injection. Patient prepped with Betadine. Lateral approach towards injection used. Injected 80 mg of Depo-Medrol and 1 mL of 2% lidocaine. Patient tolerated procedure well and no side effects from noted. Minimal to no bleeding. Simple bandage applied after.     Assessment & Plan:   Problem List Items Addressed This Visit      Cardiovascular and Mediastinum   Hypertension associated with  diabetes (HCC)     Digestive   GERD (gastroesophageal reflux disease)   Relevant Orders   CBC with Differential/Platelet     Endocrine   Type 2 diabetes mellitus (HCC) - Primary   Relevant Orders   CMP14+EGFR   Bayer DCA Hb A1c Waived   Microalbumin / creatinine urine ratio     Other   Anxiety and depression   Abnormal liver function tests   Morbid obesity (HCC)   Relevant Orders   Lipid panel   Depression, major, in remission (HCC)    Other Visit Diagnoses    Osteoarthritis of right knee, unspecified osteoarthritis type       Relevant Medications   methylPREDNISolone acetate (DEPO-MEDROL) injection 80 mg (Start on 12/18/2017  2:30 PM)   methylPREDNISolone acetate (DEPO-MEDROL) injection 80 mg (Start on 12/18/2017  2:30 PM)   Osteoarthritis of left knee, unspecified osteoarthritis type       Relevant Medications   methylPREDNISolone acetate (DEPO-MEDROL) injection 80 mg (Start on 12/18/2017  2:30 PM)   methylPREDNISolone acetate (DEPO-MEDROL) injection 80 mg (Start on 12/18/2017   2:30 PM)       Follow up plan: Return in about 4 months (around 04/19/2018), or if symptoms worsen or fail to improve, for Diabetes recheck.  Counseling provided for all of the vaccine components No orders of the defined types were placed in this encounter.    , MD [facility] 12/18/2017, 2:08 PM     

## 2017-12-29 ENCOUNTER — Other Ambulatory Visit: Payer: Self-pay | Admitting: Family Medicine

## 2017-12-29 DIAGNOSIS — F329 Major depressive disorder, single episode, unspecified: Secondary | ICD-10-CM

## 2017-12-29 DIAGNOSIS — F325 Major depressive disorder, single episode, in full remission: Secondary | ICD-10-CM

## 2017-12-29 DIAGNOSIS — F419 Anxiety disorder, unspecified: Secondary | ICD-10-CM

## 2018-01-08 ENCOUNTER — Other Ambulatory Visit: Payer: Self-pay | Admitting: Family Medicine

## 2018-01-08 DIAGNOSIS — G8929 Other chronic pain: Secondary | ICD-10-CM

## 2018-01-08 DIAGNOSIS — M545 Low back pain: Principal | ICD-10-CM

## 2018-01-13 ENCOUNTER — Telehealth: Payer: Self-pay | Admitting: Clinical

## 2018-01-13 NOTE — Telephone Encounter (Signed)
This [facility] specialist left message to call back with name and contact information. [facility] specialist following up on how she's doing.  Chart reviewed and patient reported improvement at her visit on 12/18/17.  Plan: [facility] team will follow up & provide additional support as needed.

## 2018-01-19 ENCOUNTER — Other Ambulatory Visit: Payer: Self-pay | Admitting: Family Medicine

## 2018-01-19 NOTE — Telephone Encounter (Signed)
Last seen 12/18/17  Dr Dettinger 

## 2018-01-29 ENCOUNTER — Other Ambulatory Visit: Payer: Self-pay | Admitting: Family Medicine

## 2018-01-29 DIAGNOSIS — F325 Major depressive disorder, single episode, in full remission: Secondary | ICD-10-CM

## 2018-01-29 DIAGNOSIS — F419 Anxiety disorder, unspecified: Secondary | ICD-10-CM

## 2018-01-29 DIAGNOSIS — F329 Major depressive disorder, single episode, unspecified: Secondary | ICD-10-CM

## 2018-02-09 ENCOUNTER — Other Ambulatory Visit: Payer: Self-pay | Admitting: Family Medicine

## 2018-02-09 DIAGNOSIS — M545 Low back pain, unspecified: Secondary | ICD-10-CM

## 2018-02-09 DIAGNOSIS — G8929 Other chronic pain: Secondary | ICD-10-CM

## 2018-02-16 ENCOUNTER — Telehealth: Payer: Self-pay

## 2018-02-16 NOTE — Telephone Encounter (Signed)
Writer is unable to make contact with the patient.  Writer has made several attempts ton follow up with the patient.  Writer will make this patient inactive.  Writer routed this information to PCP and Dr. Hisada  

## 2018-03-13 ENCOUNTER — Encounter: Payer: Self-pay | Admitting: Family Medicine

## 2018-03-13 ENCOUNTER — Other Ambulatory Visit: Payer: Self-pay | Admitting: Family Medicine

## 2018-03-13 ENCOUNTER — Ambulatory Visit: Payer: Medicaid Other | Admitting: Family Medicine

## 2018-03-13 VITALS — BP 138/83 | HR 81 | Temp 97.6°F | Ht 67.0 in | Wt 304.0 lb

## 2018-03-13 DIAGNOSIS — N898 Other specified noninflammatory disorders of vagina: Secondary | ICD-10-CM | POA: Diagnosis not present

## 2018-03-13 DIAGNOSIS — N949 Unspecified condition associated with female genital organs and menstrual cycle: Secondary | ICD-10-CM

## 2018-03-13 DIAGNOSIS — Z113 Encounter for screening for infections with a predominantly sexual mode of transmission: Secondary | ICD-10-CM

## 2018-03-13 LAB — URINALYSIS, COMPLETE
Bilirubin, UA: NEGATIVE
KETONES UA: NEGATIVE
LEUKOCYTES UA: NEGATIVE
Nitrite, UA: NEGATIVE
Protein, UA: NEGATIVE
RBC, UA: NEGATIVE
SPEC GRAV UA: 1.01 (ref 1.005–1.030)
Urobilinogen, Ur: 0.2 mg/dL (ref 0.2–1.0)
pH, UA: 5.5 (ref 5.0–7.5)

## 2018-03-13 LAB — MICROSCOPIC EXAMINATION: Renal Epithel, UA: NONE SEEN /hpf

## 2018-03-13 LAB — WET PREP FOR TRICH, YEAST, CLUE
CLUE CELL EXAM: NEGATIVE
TRICHOMONAS EXAM: NEGATIVE
Yeast Exam: POSITIVE — AB

## 2018-03-13 LAB — BAYER DCA HB A1C WAIVED: HB A1C (BAYER DCA - WAIVED): 9.2 % — ABNORMAL HIGH (ref <7.0)

## 2018-03-13 MED ORDER — FLUCONAZOLE 150 MG PO TABS: 150.0000 mg | ORAL_TABLET | Freq: Once | ORAL | 0 refills | Status: AC

## 2018-03-13 NOTE — Patient Instructions (Signed)
You had labs performed today.  You will be contacted with the results of the labs once they are available, usually in the next 3 business days for routine lab work.  If you had a pap smear or biopsy performed, expect to be contacted in about 7-10 days.  Follow up with Dr Louanne Skyeettinger

## 2018-03-13 NOTE — Progress Notes (Signed)
Subjective: CC: vaginal itching PCP: Dettinger, Elige RadonJoshua A, MD ZOX:WRUEAVWHPI:Heidi Ceasar LundCrone is a 27 y.o. female presenting to clinic today for:  1. Vaginal Itching Patient reports that vaginal itching started about 2 weeks ago.  She notes that discharge appears whiteish.  She reports that external vaginal hurst with urination.  She has seen scant blood when she wipes recently.  She denies vaginal odors.  She denies abnormal vaginal bleeding, nausea, vomiting, fevers, dyspareunia, post coital bleeding.  She has been told she has herpes in the past but has never had an outbreak.  She has used nystatin cream and vagisil with little relief.   She is sexually active and does not use condoms.  Contraception: Supracervical hysterectomy.  Patient's last menstrual period was 05/28/2017. PMH significant for T2DM.  ROS: Per HPI  Allergies  Allergen Reactions  . Hydroxyzine     Causes her more anxiety.  [name]. Linzess [Linaclotide] Hives  . Lorazepam     Sedation  . Morphine And Related Other (See Comments)    Makes pt.aggressive  . Quetiapine     Sweating, irritability   Past Medical History:  Diagnosis Date  . Abdominal pain 01/10/2016  . Anxiety   . Back pain   . Bloating 01/10/2016  . Depression   . GERD (gastroesophageal reflux disease)   . Hematuria 01/10/2016  . Herpes simplex virus (HSV) infection   . Hypertension   . Osteoarthritis   . Prediabetes   . Schizo-affective schizophrenia (HCC)   . Type 2 diabetes mellitus (HCC) 03/28/2016  . Urinary frequency 01/10/2016    Current Outpatient Medications:  .  albuterol (PROVENTIL HFA;VENTOLIN HFA) 108 (90 Base) MCG/ACT inhaler, Inhale 2 puffs into the lungs every 6 (six) hours as needed for wheezing or shortness of breath., Disp: 1 Inhaler, Rfl: 2 .  ALPRAZolam (XANAX) 0.25 MG tablet, Take 0.5-1 tablets (0.125-0.25 mg total) by mouth 2 (two) times daily as needed for anxiety (depends on anxiety if takes 05-0 tablet)., Disp: 15 tablet, Rfl: 2 .   amLODipine (NORVASC) 5 MG tablet, TAKE 1 TABLET BY MOUTH ONCE DAILY, Disp: 90 tablet, Rfl: 0 .  buPROPion (WELLBUTRIN XL) 150 MG 24 hr tablet, Take 1 tablet (150 mg total) by mouth daily., Disp: 30 tablet, Rfl: 2 .  cyclobenzaprine (FLEXERIL) 10 MG tablet, TAKE 1 TABLET BY MOUTH THREE TIMES DAILY AS NEEDED FOR MUSCLE SPASM, Disp: 30 tablet, Rfl: 2 .  dexlansoprazole (DEXILANT) 60 MG capsule, Take 1 capsule (60 mg total) by mouth daily., Disp: 30 capsule, Rfl: 11 .  glucose blood (ACCU-CHEK GUIDE) test strip, Use to check BG once daily at varying times of day., Disp: 100 each, Rfl: 3 .  Lancets 30G MISC, Dispense Accu-Chek guide lancets.  Check blood sugar at varying times once per day., Disp: 100 each, Rfl: 11 .  lisinopril-hydrochlorothiazide (ZESTORETIC) 20-12.5 MG tablet, Take 1 tablet by mouth daily., Disp: 90 tablet, Rfl: 3 .  meloxicam (MOBIC) 7.5 MG tablet, TAKE 2 TABLETS BY MOUTH ONCE DAILY, Disp: 60 tablet, Rfl: 0 .  metFORMIN (GLUCOPHAGE) 1000 MG tablet, Take 1,000 mg by mouth 2 (two) times daily with a meal., Disp: , Rfl:  .  metFORMIN (GLUCOPHAGE) 1000 MG tablet, TAKE 1 TABLET BY MOUTH TWICE DAILY WITH A MEAL, Disp: 60 tablet, Rfl: 3 .  Multiple Vitamin (MULTIVITAMIN) tablet, Take 1 tablet by mouth daily., Disp: , Rfl:  .  OLANZapine (ZYPREXA) 10 MG tablet, TAKE 1 TABLET BY MOUTH AT BEDTIME, Disp: 90 tablet, Rfl: 0 .  PRISTIQ 50 MG 24 hr tablet, TAKE 3 TABLETS BY MOUTH ONCE DAILY, Disp: 270 tablet, Rfl: 0 .  traZODone (DESYREL) 50 MG tablet, Take 1 tablet (50 mg total) by mouth at bedtime as needed for sleep., Disp: 30 tablet, Rfl: 2 Social History   Socioeconomic History  . Marital status: Legally Separated    Spouse name: Not on file  . Number of children: 1  . Years of education: HS  . Highest education level: Not on file  Occupational History  . Occupation: Unemployed  Social Needs  . Financial resource strain: Not on file  . Food insecurity:    Worry: Not on file     Inability: Not on file  . Transportation needs:    Medical: Not on file    Non-medical: Not on file  Tobacco Use  . Smoking status: Current Every Day Smoker    Packs/day: 1.00    Years: 10.00    Pack years: 10.00    Types: Cigarettes  . Smokeless tobacco: Never Used  Substance and Sexual Activity  . Alcohol use: Yes    Alcohol/week: 0.0 oz    Comment: Occasionally   . Drug use: Yes    Types: Marijuana    Comment: Occasionally  . Sexual activity: Not Currently    Birth control/protection: Surgical    Comment: tubal  Lifestyle  . Physical activity:    Days per week: Not on file    Minutes per session: Not on file  . Stress: Not on file  Relationships  . Social connections:    Talks on phone: Not on file    Gets together: Not on file    Attends religious service: Not on file    Active member of club or organization: Not on file    Attends meetings of clubs or organizations: Not on file    Relationship status: Not on file  . Intimate partner violence:    Fear of current or ex partner: Not on file    Emotionally abused: Not on file    Physically abused: Not on file    Forced sexual activity: Not on file  Other Topics Concern  . Not on file  Social History Narrative   Lives at home with her boyfriend.   Right-handed.   Drinks approximately two 2-liter sodas per day.   Drinks 1 cup coffee per day.   Family History  Problem Relation Age of Onset  . Depression Mother   . Hypertension Mother   . Diabetes Mother   . Mental illness Father   . Hypertension Father   . Diabetes Father   . Cancer Maternal Aunt   . Diabetes Paternal Grandmother   . Hypertension Paternal Grandmother   . Arthritis Paternal Grandmother   . Stroke Paternal Grandfather   . Heart disease Paternal Grandfather   . Hypertension Paternal Grandfather   . Diabetes Paternal Grandfather   . Schizophrenia Maternal Grandfather   . Alcohol abuse Maternal Grandfather   . Other Sister        recovering  addict  . Schizophrenia Sister   . Irritable bowel syndrome Sister   . Other Sister        recovering addict  . Bipolar disorder Sister   . Seizures Sister   . Colon cancer Neg Hx   . Inflammatory bowel disease Neg Hx   . Celiac disease Neg Hx     Objective: Office vital signs reviewed. BP 138/83   Pulse 81   Temp  97.6 F (36.4 C) (Oral)   Ht 5\' 7"  (1.702 m)   Wt (!) 304 lb (137.9 kg)   LMP 05/28/2017   BMI 47.61 kg/m   Physical Examination:  General: Awake, alert, obese, No acute distress GU: external vaginal tissue slightly inflamed at the apex of the vagina, no herpetic lesions appreciated. no punctate lesions on cervix appreciated, moderate white creamy discharge from cervical os, no bleeding, no cervical motion tenderness, no abdominal/ adnexal masses  Assessment/ Plan: 27 y.o. female   1. Vaginal pruritus Possibly secondary to yeast infection.  Check wet prep, GC chlamydia and STD screen.  UA also collected.  Will contact patient via MyChart per her request with results. - WET PREP FOR TRICH, YEAST, CLUE  2. Vaginal burning - GC/Chlamydia Probe Amp([facility]) - Urinalysis, Complete  3. Screen for STD (sexually transmitted disease) - STD Screen (8)   Orders Placed This Encounter  Procedures  . WET PREP FOR TRICH, YEAST, CLUE  . GC/Chlamydia Probe Amp([facility])  . Urinalysis, Complete  . STD Screen (8)      Keyonni Percival Hulen Skains, DO [facility] [phone]

## 2018-03-14 LAB — CMP14+EGFR
A/G RATIO: 1.6 (ref 1.2–2.2)
ALK PHOS: 135 IU/L — AB (ref 39–117)
ALT: 48 IU/L — AB (ref 0–32)
AST: 25 IU/L (ref 0–40)
Albumin: 3.9 g/dL (ref 3.5–5.5)
BILIRUBIN TOTAL: 0.2 mg/dL (ref 0.0–1.2)
BUN/Creatinine Ratio: 12 (ref 9–23)
BUN: 8 mg/dL (ref 6–20)
CHLORIDE: 98 mmol/L (ref 96–106)
CO2: 23 mmol/L (ref 20–29)
Calcium: 9.3 mg/dL (ref 8.7–10.2)
Creatinine, Ser: 0.69 mg/dL (ref 0.57–1.00)
GFR calc non Af Amer: 119 mL/min/{1.73_m2} (ref >59)
GFR, EST AFRICAN AMERICAN: 137 mL/min/{1.73_m2} (ref >59)
Globulin, Total: 2.4 g/dL (ref 1.5–4.5)
Glucose: 318 mg/dL — ABNORMAL HIGH (ref 65–99)
POTASSIUM: 4.2 mmol/L (ref 3.5–5.2)
Sodium: 135 mmol/L (ref 134–144)
Total Protein: 6.3 g/dL (ref 6.0–8.5)

## 2018-03-14 LAB — STD SCREEN (8)
HIV SCREEN 4TH GENERATION: NONREACTIVE
HSV 1 Glycoprotein G Ab, IgG: <0.91 index (ref 0.00–0.90)
HSV 2 IgG, Type Spec: 12.7 index — ABNORMAL HIGH (ref 0.00–0.90)
Hep A IgM: NEGATIVE
Hep B C IgM: NEGATIVE
Hepatitis B Surface Ag: NEGATIVE
RPR Ser Ql: NONREACTIVE

## 2018-03-14 LAB — CBC WITH DIFFERENTIAL/PLATELET
Basophils Absolute: 0.1 10*3/uL (ref 0.0–0.2)
Basos: 1 %
EOS (ABSOLUTE): 0.2 10*3/uL (ref 0.0–0.4)
Eos: 1 %
HEMATOCRIT: 36.3 % (ref 34.0–46.6)
HEMOGLOBIN: 11.6 g/dL (ref 11.1–15.9)
Immature Grans (Abs): 0 10*3/uL (ref 0.0–0.1)
Immature Granulocytes: 0 %
LYMPHS ABS: 2.3 10*3/uL (ref 0.7–3.1)
LYMPHS: 21 %
MCH: 26.1 pg — AB (ref 26.6–33.0)
MCHC: 32 g/dL (ref 31.5–35.7)
MCV: 82 fL (ref 79–97)
Monocytes Absolute: 0.5 10*3/uL (ref 0.1–0.9)
Monocytes: 4 %
NEUTROS ABS: 7.8 10*3/uL — AB (ref 1.4–7.0)
Neutrophils: 73 %
Platelets: 264 10*3/uL (ref 150–450)
RBC: 4.45 x10E6/uL (ref 3.77–5.28)
RDW: 17.1 % — ABNORMAL HIGH (ref 12.3–15.4)
WBC: 10.8 10*3/uL (ref 3.4–10.8)

## 2018-03-14 LAB — MICROALBUMIN / CREATININE URINE RATIO
Creatinine, Urine: 39.9 mg/dL
Microalb/Creat Ratio: 13.8 mg/g creat (ref 0.0–30.0)
Microalbumin, Urine: 5.5 ug/mL

## 2018-03-14 LAB — LIPID PANEL
CHOL/HDL RATIO: 5.7 ratio — AB (ref 0.0–4.4)
Cholesterol, Total: 149 mg/dL (ref 100–199)
HDL: 26 mg/dL — ABNORMAL LOW (ref >39)
LDL CALC: 66 mg/dL (ref 0–99)
TRIGLYCERIDES: 287 mg/dL — AB (ref 0–149)
VLDL Cholesterol Cal: 57 mg/dL — ABNORMAL HIGH (ref 5–40)

## 2018-03-15 ENCOUNTER — Other Ambulatory Visit: Payer: Self-pay | Admitting: Family Medicine

## 2018-03-15 DIAGNOSIS — M545 Low back pain: Secondary | ICD-10-CM

## 2018-03-15 DIAGNOSIS — G8929 Other chronic pain: Secondary | ICD-10-CM

## 2018-03-15 DIAGNOSIS — I10 Essential (primary) hypertension: Secondary | ICD-10-CM

## 2018-03-16 ENCOUNTER — Encounter: Payer: Self-pay | Admitting: Family Medicine

## 2018-03-17 LAB — GC/CHLAMYDIA PROBE AMP
Chlamydia trachomatis, NAA: NEGATIVE
NEISSERIA GONORRHOEAE BY PCR: NEGATIVE

## 2018-03-24 ENCOUNTER — Telehealth: Payer: Self-pay

## 2018-03-24 NOTE — Telephone Encounter (Signed)
PA for Dexilant 60 mg, one capsule daily was approved through 03/19/2019. [facility] has been notified pf approval.

## 2018-03-25 ENCOUNTER — Other Ambulatory Visit: Payer: Self-pay | Admitting: Family Medicine

## 2018-03-25 DIAGNOSIS — F329 Major depressive disorder, single episode, unspecified: Secondary | ICD-10-CM

## 2018-03-25 DIAGNOSIS — F419 Anxiety disorder, unspecified: Principal | ICD-10-CM

## 2018-04-05 ENCOUNTER — Encounter: Payer: Self-pay | Admitting: Family Medicine

## 2018-04-06 ENCOUNTER — Other Ambulatory Visit: Payer: Self-pay | Admitting: Family Medicine

## 2018-04-06 MED ORDER — FLUCONAZOLE 150 MG PO TABS: 150.0000 mg | ORAL_TABLET | Freq: Once | ORAL | 0 refills | Status: AC

## 2018-04-13 ENCOUNTER — Encounter: Payer: Self-pay | Admitting: Internal Medicine

## 2018-04-14 ENCOUNTER — Other Ambulatory Visit: Payer: Self-pay | Admitting: Family Medicine

## 2018-04-15 ENCOUNTER — Encounter: Payer: Self-pay | Admitting: Family Medicine

## 2018-04-15 ENCOUNTER — Ambulatory Visit (INDEPENDENT_AMBULATORY_CARE_PROVIDER_SITE_OTHER): Payer: Medicaid Other | Admitting: Family Medicine

## 2018-04-15 VITALS — BP 144/87 | HR 88 | Temp 97.2°F | Ht 67.0 in | Wt 304.0 lb

## 2018-04-15 DIAGNOSIS — N898 Other specified noninflammatory disorders of vagina: Secondary | ICD-10-CM

## 2018-04-15 DIAGNOSIS — B372 Candidiasis of skin and nail: Secondary | ICD-10-CM

## 2018-04-15 LAB — WET PREP FOR TRICH, YEAST, CLUE
Clue Cell Exam: NEGATIVE
Trichomonas Exam: NEGATIVE
Yeast Exam: NEGATIVE

## 2018-04-15 MED ORDER — FLUCONAZOLE 150 MG PO TABS
150.0000 mg | ORAL_TABLET | Freq: Once | ORAL | 2 refills | Status: AC
Start: 2018-04-15 — End: 2018-04-15

## 2018-04-15 NOTE — Progress Notes (Signed)
BP (!) 144/87   Pulse 88   Temp (!) 97.2 F (36.2 C) (Oral)   Ht 5\' 7"  (1.702 m)   Wt (!) 304 lb (137.9 kg)   LMP 05/28/2017   BMI 47.61 kg/m    Subjective:    Patient ID: Heidi [name], female    DOB: 06/09/1991, 27 y.o.   MRN: [id number]  HPI: Heidi MyrtleJessica Reino is a 27 y.o. female presenting on 04/15/2018 for Vaginal Itching (x 1 day)   HPI Vaginal itching and irritation Patient is coming in with complaints of vaginal itching and irritation that is been bothering her over the past 1 day she says that she has gotten like this frequently.  She says especially with the heat and her extra weight she has this issue.  She says is not actually in her vagina but more on the outside near her clitoris and she does not have any redness or rash yet but she does develop it over time.  She denies actually any internal vaginal discharge or internal vaginal pain.  She denies any new partners and has had the same partner for some time.  Relevant past medical, surgical, family and social history reviewed and updated as indicated. Interim medical history since our last visit reviewed. Allergies and medications reviewed and updated.  Review of Systems  Constitutional: Negative for chills and fever.  Eyes: Negative for visual disturbance.  Respiratory: Negative for chest tightness and shortness of breath.   Cardiovascular: Negative for chest pain and leg swelling.  Genitourinary: Positive for vaginal discharge.  Musculoskeletal: Negative for back pain and gait problem.  Skin: Positive for rash.  Neurological: Negative for light-headedness and headaches.  Psychiatric/Behavioral: Negative for agitation and behavioral problems.  All other systems reviewed and are negative.   Per HPI unless specifically indicated above   Allergies as of 04/15/2018      Reactions   Hydroxyzine    Causes her more anxiety.   Linzess [linaclotide] Hives   Lorazepam    Sedation   Morphine And Related Other (See  Comments)   Makes pt.aggressive   Quetiapine    Sweating, irritability      Medication List        Accurate as of 04/15/18  4:59 PM. Always use your most recent med list.          albuterol 108 (90 Base) MCG/ACT inhaler Commonly known as:  PROVENTIL HFA;VENTOLIN HFA Inhale 2 puffs into the lungs every 6 (six) hours as needed for wheezing or shortness of breath.   ALPRAZolam 0.25 MG tablet Commonly known as:  XANAX Take 0.5-1 tablets (0.125-0.25 mg total) by mouth 2 (two) times daily as needed for anxiety (depends on anxiety if takes 05-0 tablet).   amLODipine 5 MG tablet Commonly known as:  NORVASC TAKE 1 TABLET BY MOUTH ONCE DAILY   buPROPion 150 MG 24 hr tablet Commonly known as:  WELLBUTRIN XL Take 1 tablet (150 mg total) by mouth daily.   cyclobenzaprine 10 MG tablet Commonly known as:  FLEXERIL TAKE 1 TABLET BY MOUTH THREE TIMES DAILY AS NEEDED FOR MUSCLE SPASM   dexlansoprazole 60 MG capsule Commonly known as:  DEXILANT Take 1 capsule (60 mg total) by mouth daily.   fluconazole 150 MG tablet Commonly known as:  DIFLUCAN Take 1 tablet (150 mg total) by mouth once for 1 dose.   glucose blood test strip Use to check BG once daily at varying times of day.   Lancets 30G Misc Dispense Accu-Chek  guide lancets.  Check blood sugar at varying times once per day.   lisinopril-hydrochlorothiazide 20-12.5 MG tablet Commonly known as:  PRINZIDE,ZESTORETIC Take 1 tablet by mouth daily.   meloxicam 7.5 MG tablet Commonly known as:  MOBIC TAKE 2 TABLETS BY MOUTH ONCE DAILY   metFORMIN 1000 MG tablet Commonly known as:  GLUCOPHAGE TAKE 1 TABLET BY MOUTH TWICE DAILY WITH A MEAL   multivitamin tablet Take 1 tablet by mouth daily.   OLANZapine 10 MG tablet Commonly known as:  ZYPREXA TAKE 1 TABLET BY MOUTH AT BEDTIME   PRISTIQ 50 MG 24 hr tablet Generic drug:  desvenlafaxine TAKE 3 TABLETS BY MOUTH ONCE DAILY   traZODone 50 MG tablet Commonly known as:   DESYREL Take 1 tablet (50 mg total) by mouth at bedtime as needed for sleep.          Objective:    BP (!) 144/87   Pulse 88   Temp (!) 97.2 F (36.2 C) (Oral)   Ht 5\' 7"  (1.702 m)   Wt (!) 304 lb (137.9 kg)   LMP 05/28/2017   BMI 47.61 kg/m   Wt Readings from Last 3 Encounters:  04/15/18 (!) 304 lb (137.9 kg)  03/13/18 (!) 304 lb (137.9 kg)  12/18/17 300 lb (136.1 kg)    Physical Exam  Constitutional: She is oriented to person, place, and time. She appears well-developed and well-nourished. No distress.  Eyes: Conjunctivae are normal.  Neurological: She is alert and oriented to person, place, and time. Coordination normal.  Skin: Skin is warm and dry. Rash (White discharge and irritation in itching around the clitoris, patient declined exam today, this is verbal per patient) noted. She is not diaphoretic.  Psychiatric: She has a normal mood and affect. Her behavior is normal.  Nursing note and vitals reviewed.   Wet prep negative     Assessment & Plan:   Problem List Items Addressed This Visit    None    Visit Diagnoses    Yeast dermatitis    -  Primary   Patient has yeast dermatitis in her groin and has taken with Diflucan before and it did work for it.  We will send another one with a refill.  Recommended topic   Relevant Medications   fluconazole (DIFLUCAN) 150 MG tablet   Vagina itching       Relevant Medications   fluconazole (DIFLUCAN) 150 MG tablet   Other Relevant Orders   WET PREP FOR TRICH, YEAST, CLUE      Patient complains more of some Follow up plan: Return if symptoms worsen or fail to improve.  Counseling provided for all of the vaccine components Orders Placed This Encounter  Procedures  . WET PREP FOR TRICH, YEAST, CLUE    Arville CareJoshua Lundon Rosier, MD [facility] 04/15/2018, 4:59 PM

## 2018-04-21 ENCOUNTER — Encounter: Payer: Self-pay | Admitting: Family Medicine

## 2018-04-23 ENCOUNTER — Encounter: Payer: Self-pay | Admitting: Family Medicine

## 2018-04-23 ENCOUNTER — Other Ambulatory Visit: Payer: Self-pay | Admitting: Gastroenterology

## 2018-04-23 ENCOUNTER — Ambulatory Visit (INDEPENDENT_AMBULATORY_CARE_PROVIDER_SITE_OTHER): Payer: Medicaid Other | Admitting: Family Medicine

## 2018-04-23 VITALS — BP 126/79 | HR 78 | Temp 97.9°F | Ht 67.0 in | Wt 306.0 lb

## 2018-04-23 DIAGNOSIS — M1711 Unilateral primary osteoarthritis, right knee: Secondary | ICD-10-CM

## 2018-04-23 DIAGNOSIS — M1712 Unilateral primary osteoarthritis, left knee: Secondary | ICD-10-CM

## 2018-04-23 DIAGNOSIS — R7989 Other specified abnormal findings of blood chemistry: Secondary | ICD-10-CM

## 2018-04-23 DIAGNOSIS — E1169 Type 2 diabetes mellitus with other specified complication: Secondary | ICD-10-CM

## 2018-04-23 DIAGNOSIS — R945 Abnormal results of liver function studies: Secondary | ICD-10-CM

## 2018-04-23 LAB — BAYER DCA HB A1C WAIVED: HB A1C (BAYER DCA - WAIVED): 9.8 % — ABNORMAL HIGH (ref <7.0)

## 2018-04-23 MED ORDER — METHYLPREDNISOLONE ACETATE 80 MG/ML IJ SUSP: 80.0000 mg | Freq: Once | INTRAMUSCULAR | Status: DC

## 2018-04-23 NOTE — Progress Notes (Signed)
BP 126/79   Pulse 78   Temp 97.9 F (36.6 C)   Ht '5\' 7"'  (1.702 m)   Wt (!) 306 lb (138.8 kg)   LMP 05/28/2017   BMI 47.93 kg/m    Subjective:    Patient ID: Heidi [name], female    DOB: 11/02/1990, 27 y.o.   MRN: [id number]  HPI: Heidi [name] is a 27 y.o. female presenting on 04/23/2018 for Knee Pain (bilateral knee pain)   HPI Type 2 diabetes mellitus Patient comes in today for recheck of his diabetes. Patient has been currently taking metformin but she wants to stop because she had one episode of low, she has switched her diet to keto, there is some concern that she is just having low feelings because she did not actually check it because she is used to being so much higher.  Her last A1c was 9.1.. Patient is currently on an ACE inhibitor/ARB. Patient has not seen an ophthalmologist this year. Patient denies any issues with their feet.   Bilateral knee arthritis Patient is coming in with bilateral knee arthritis that has been something that is bothering her over the past few years.  She has had injections on at least 4 occasions now and they last about 4 months.  She does have some possibility of a rheumatological arthritis though they have not specified which one specifically and they recommended to keep going with the injections as long as she can tolerate it.  Patient had abnormal liver function tests on the last draw that was about stable from the previous, we will recheck today.  Relevant past medical, surgical, family and social history reviewed and updated as indicated. Interim medical history since our last visit reviewed. Allergies and medications reviewed and updated.  Review of Systems  Constitutional: Negative for chills and fever.  HENT: Negative for congestion, ear discharge and ear pain.   Eyes: Negative for visual disturbance.  Respiratory: Negative for chest tightness and shortness of breath.   Cardiovascular: Negative for chest pain and leg swelling.    Genitourinary: Negative for difficulty urinating and dysuria.  Musculoskeletal: Positive for arthralgias. Negative for back pain, gait problem and joint swelling.  Skin: Negative for rash.  Neurological: Negative for light-headedness and headaches.  Psychiatric/Behavioral: Positive for dysphoric mood. Negative for agitation, behavioral problems, self-injury, sleep disturbance and suicidal ideas. The patient is nervous/anxious.   All other systems reviewed and are negative.   Per HPI unless specifically indicated above   Allergies as of 04/23/2018      Reactions   Hydroxyzine    Causes her more anxiety.   Linzess [linaclotide] Hives   Lorazepam    Sedation   Morphine And Related Other (See Comments)   Makes pt.aggressive   Quetiapine    Sweating, irritability      Medication List        Accurate as of 04/23/18  8:50 AM. Always use your most recent med list.          albuterol 108 (90 Base) MCG/ACT inhaler Commonly known as:  PROVENTIL HFA;VENTOLIN HFA Inhale 2 puffs into the lungs every 6 (six) hours as needed for wheezing or shortness of breath.   ALPRAZolam 0.25 MG tablet Commonly known as:  XANAX Take 0.5-1 tablets (0.125-0.25 mg total) by mouth 2 (two) times daily as needed for anxiety (depends on anxiety if takes 05-0 tablet).   amLODipine 5 MG tablet Commonly known as:  NORVASC TAKE 1 TABLET BY MOUTH ONCE DAILY  buPROPion 150 MG 24 hr tablet Commonly known as:  WELLBUTRIN XL Take 1 tablet (150 mg total) by mouth daily.   cyclobenzaprine 10 MG tablet Commonly known as:  FLEXERIL TAKE 1 TABLET BY MOUTH THREE TIMES DAILY AS NEEDED FOR MUSCLE SPASM   dexlansoprazole 60 MG capsule Commonly known as:  DEXILANT Take 1 capsule (60 mg total) by mouth daily.   glucose blood test strip Use to check BG once daily at varying times of day.   Lancets 30G Misc Dispense Accu-Chek guide lancets.  Check blood sugar at varying times once per day.    lisinopril-hydrochlorothiazide 20-12.5 MG tablet Commonly known as:  PRINZIDE,ZESTORETIC Take 1 tablet by mouth daily.   meloxicam 7.5 MG tablet Commonly known as:  MOBIC TAKE 2 TABLETS BY MOUTH ONCE DAILY   metFORMIN 1000 MG tablet Commonly known as:  GLUCOPHAGE TAKE 1 TABLET BY MOUTH TWICE DAILY WITH A MEAL   multivitamin tablet Take 1 tablet by mouth daily.   OLANZapine 10 MG tablet Commonly known as:  ZYPREXA TAKE 1 TABLET BY MOUTH AT BEDTIME   PRISTIQ 50 MG 24 hr tablet Generic drug:  desvenlafaxine TAKE 3 TABLETS BY MOUTH ONCE DAILY   traZODone 50 MG tablet Commonly known as:  DESYREL Take 1 tablet (50 mg total) by mouth at bedtime as needed for sleep.          Objective:    BP 126/79   Pulse 78   Temp 97.9 F (36.6 C)   Ht '5\' 7"'  (1.702 m)   Wt (!) 306 lb (138.8 kg)   LMP 05/28/2017   BMI 47.93 kg/m   Wt Readings from Last 3 Encounters:  04/23/18 (!) 306 lb (138.8 kg)  04/15/18 (!) 304 lb (137.9 kg)  03/13/18 (!) 304 lb (137.9 kg)    Physical Exam  Constitutional: She is oriented to person, place, and time. She appears well-developed and well-nourished. No distress.  Eyes: Pupils are equal, round, and reactive to light. Conjunctivae and EOM are normal.  Neck: Neck supple. No thyromegaly present.  Cardiovascular: Normal rate, regular rhythm, normal heart sounds and intact distal pulses.  No murmur heard. Pulmonary/Chest: Effort normal and breath sounds normal. No respiratory distress. She has no wheezes.  Musculoskeletal: Normal range of motion. She exhibits tenderness (Bilateral knee tenderness, throughout the knee, no specific ligament weakness noted). She exhibits no edema.  Lymphadenopathy:    She has no cervical adenopathy.  Neurological: She is alert and oriented to person, place, and time. Coordination normal.  Skin: Skin is warm and dry. No rash noted. She is not diaphoretic.  Psychiatric: She has a normal mood and affect. Her behavior is  normal.  Nursing note and vitals reviewed.   Knee injection x2: verbal consent obtained. Risk factors of bleeding and infection discussed with patient and patient is agreeable towards injection. Patient prepped with Betadine. Lateral approach towards injection used. Injected 80 mg of Depo-Medrol and 1 mL of 2% lidocaine. Patient tolerated procedure well and no side effects from noted. Minimal to no bleeding. Simple bandage applied after.     Assessment & Plan:   Problem List Items Addressed This Visit      Endocrine   Type 2 diabetes mellitus ([place]) - Primary   Relevant Orders   CMP14+EGFR   Bayer DCA Hb A1c Waived     Other   Abnormal liver function tests   Morbid obesity ([place])    Other Visit Diagnoses    Osteoarthritis of right knee,  unspecified osteoarthritis type       Relevant Medications   methylPREDNISolone acetate (DEPO-MEDROL) injection 80 mg   methylPREDNISolone acetate (DEPO-MEDROL) injection 80 mg   Osteoarthritis of left knee, unspecified osteoarthritis type       Relevant Medications   methylPREDNISolone acetate (DEPO-MEDROL) injection 80 mg   methylPREDNISolone acetate (DEPO-MEDROL) injection 80 mg       Follow up plan: Return in about 4 months (around 08/23/2018), or if symptoms worsen or fail to improve, for Follow-up diabetes.  Counseling provided for all of the vaccine components No orders of the defined types were placed in this encounter.   Caryl Pina, MD [facility] 04/23/2018, 8:50 AM

## 2018-04-24 LAB — CMP14+EGFR
A/G RATIO: 1.7 (ref 1.2–2.2)
ALBUMIN: 4 g/dL (ref 3.5–5.5)
ALT: 49 IU/L — ABNORMAL HIGH (ref 0–32)
AST: 35 IU/L (ref 0–40)
Alkaline Phosphatase: 136 IU/L — ABNORMAL HIGH (ref 39–117)
BUN / CREAT RATIO: 19 (ref 9–23)
BUN: 14 mg/dL (ref 6–20)
Bilirubin Total: 0.3 mg/dL (ref 0.0–1.2)
CALCIUM: 9.5 mg/dL (ref 8.7–10.2)
CO2: 19 mmol/L — AB (ref 20–29)
CREATININE: 0.72 mg/dL (ref 0.57–1.00)
Chloride: 100 mmol/L (ref 96–106)
GFR calc Af Amer: 132 mL/min/{1.73_m2} (ref >59)
GFR, EST NON AFRICAN AMERICAN: 114 mL/min/{1.73_m2} (ref >59)
GLOBULIN, TOTAL: 2.3 g/dL (ref 1.5–4.5)
Glucose: 249 mg/dL — ABNORMAL HIGH (ref 65–99)
Potassium: 4.6 mmol/L (ref 3.5–5.2)
SODIUM: 136 mmol/L (ref 134–144)
Total Protein: 6.3 g/dL (ref 6.0–8.5)

## 2018-04-30 ENCOUNTER — Encounter: Payer: Self-pay | Admitting: Nurse Practitioner

## 2018-04-30 ENCOUNTER — Ambulatory Visit: Payer: Medicaid Other | Admitting: Nurse Practitioner

## 2018-04-30 VITALS — BP 126/69 | HR 82 | Temp 97.5°F | Ht 67.0 in | Wt 298.1 lb

## 2018-04-30 DIAGNOSIS — R5383 Other fatigue: Secondary | ICD-10-CM

## 2018-04-30 NOTE — Progress Notes (Addendum)
   Subjective:    Patient ID: Heidi [name], female    DOB: May 03, 1991, 27 y.o.   MRN: [id number]   Chief Complaint: Fatigue   HPI Patient has had fatigue and abdominal cramping in abdomen and legs. Some weakness and fatigue. Slight headache. She was seen in office for followup  On 04/23/18 and hgba1c was 9.8. She started on keto diet about 2 weeks ago. Has really cut back on her carbs. Blood sugars have been around 140-150. She has been drinking lots of water.   Review of Systems  Constitutional: Negative.   HENT: Negative.   Respiratory: Negative.   Cardiovascular: Negative.   Gastrointestinal: Positive for constipation and nausea.  Musculoskeletal: Positive for myalgias.  Neurological: Positive for headaches.  Psychiatric/Behavioral: Negative.   All other systems reviewed and are negative.      Objective:   Physical Exam  Constitutional: She is oriented to person, place, and time. She appears well-developed and well-nourished.  Cardiovascular: Normal rate and regular rhythm.  Murmur (2/6 systolic murmur) heard. Pulmonary/Chest: Effort normal.  Abdominal: Soft.  Neurological: She is alert and oriented to person, place, and time.  Skin: Skin is warm.  Psychiatric: She has a normal mood and affect. Her behavior is normal. Judgment and thought content normal.   BP 126/69   Pulse 82   Temp (!) 97.5 F (36.4 C) (Oral)   Ht _0  (1.702 m)   Wt 298 lb 2 oz (135.2 kg)   LMP 05/28/2017   BMI 46.69 kg/m         Assessment & Plan:  Heidi [name] in today with chief complaint of Fatigue   1. Fatigue, unspecified type Continue to force fluids Rest Continue to keep diary of blood sugars Will call with lab results - CMP14+EGFR - Thyroid Panel With TSH - Lyme Ab/Western Blot Reflex - Rocky mtn spotted fvr abs pnl(IgG+IgM) - CBC with Differential/Platelet  Mary-Margaret Hassell Done, FNP  * patient told me as sh ewas leaving [place] room that she has stopped taking her  metformin.

## 2018-05-06 LAB — CMP14+EGFR
A/G RATIO: 1.9 (ref 1.2–2.2)
ALBUMIN: 4.4 g/dL (ref 3.5–5.5)
ALT: 32 IU/L (ref 0–32)
AST: 23 IU/L (ref 0–40)
Alkaline Phosphatase: 133 IU/L — ABNORMAL HIGH (ref 39–117)
BILIRUBIN TOTAL: 0.3 mg/dL (ref 0.0–1.2)
BUN / CREAT RATIO: 11 (ref 9–23)
BUN: 9 mg/dL (ref 6–20)
CHLORIDE: 99 mmol/L (ref 96–106)
CO2: 21 mmol/L (ref 20–29)
Calcium: 9.6 mg/dL (ref 8.7–10.2)
Creatinine, Ser: 0.85 mg/dL (ref 0.57–1.00)
GFR calc non Af Amer: 94 mL/min/{1.73_m2} (ref >59)
GFR, EST AFRICAN AMERICAN: 108 mL/min/{1.73_m2} (ref >59)
GLOBULIN, TOTAL: 2.3 g/dL (ref 1.5–4.5)
Glucose: 249 mg/dL — ABNORMAL HIGH (ref 65–99)
POTASSIUM: 3.9 mmol/L (ref 3.5–5.2)
SODIUM: 137 mmol/L (ref 134–144)
TOTAL PROTEIN: 6.7 g/dL (ref 6.0–8.5)

## 2018-05-06 LAB — CBC WITH DIFFERENTIAL/PLATELET
Basophils Absolute: 0.1 10*3/uL (ref 0.0–0.2)
Basos: 0 %
EOS (ABSOLUTE): 0.1 10*3/uL (ref 0.0–0.4)
Eos: 1 %
Hematocrit: 37.1 % (ref 34.0–46.6)
Hemoglobin: 11.9 g/dL (ref 11.1–15.9)
IMMATURE GRANULOCYTES: 0 %
Immature Grans (Abs): 0 10*3/uL (ref 0.0–0.1)
Lymphocytes Absolute: 3 10*3/uL (ref 0.7–3.1)
Lymphs: 24 %
MCH: 25.8 pg — AB (ref 26.6–33.0)
MCHC: 32.1 g/dL (ref 31.5–35.7)
MCV: 80 fL (ref 79–97)
MONOS ABS: 0.5 10*3/uL (ref 0.1–0.9)
Monocytes: 4 %
NEUTROS PCT: 71 %
Neutrophils Absolute: 9 10*3/uL — ABNORMAL HIGH (ref 1.4–7.0)
PLATELETS: 332 10*3/uL (ref 150–450)
RBC: 4.62 x10E6/uL (ref 3.77–5.28)
RDW: 17 % — AB (ref 12.3–15.4)
WBC: 12.7 10*3/uL — AB (ref 3.4–10.8)

## 2018-05-06 LAB — LYME AB/WESTERN BLOT REFLEX
LYME DISEASE AB, QUANT, IGM: <0.8 index (ref 0.00–0.79)
Lyme IgG/IgM Ab: <0.91 {ISR} (ref 0.00–0.90)

## 2018-05-06 LAB — THYROID PANEL WITH TSH
FREE THYROXINE INDEX: 1.8 (ref 1.2–4.9)
T3 Uptake Ratio: 25 % (ref 24–39)
T4, Total: 7.1 ug/dL (ref 4.5–12.0)
TSH: 0.89 u[IU]/mL (ref 0.450–4.500)

## 2018-05-06 LAB — ROCKY MTN SPOTTED FVR ABS PNL(IGG+IGM)
RMSF IGM: 1.06 {index} — AB (ref 0.00–0.89)
RMSF IgG: NEGATIVE

## 2018-05-07 ENCOUNTER — Other Ambulatory Visit: Payer: Self-pay | Admitting: Family Medicine

## 2018-05-07 DIAGNOSIS — F419 Anxiety disorder, unspecified: Secondary | ICD-10-CM

## 2018-05-07 DIAGNOSIS — F325 Major depressive disorder, single episode, in full remission: Secondary | ICD-10-CM

## 2018-05-07 DIAGNOSIS — F329 Major depressive disorder, single episode, unspecified: Secondary | ICD-10-CM

## 2018-05-11 ENCOUNTER — Emergency Department (HOSPITAL_COMMUNITY): Payer: Medicaid Other

## 2018-05-11 ENCOUNTER — Emergency Department (HOSPITAL_COMMUNITY)
Admission: EM | Admit: 2018-05-11 | Discharge: 2018-05-11 | Disposition: A | Discharge status: Home / Self Care | Payer: Medicaid Other | Attending: Emergency Medicine | Admitting: Emergency Medicine

## 2018-05-11 DIAGNOSIS — F17210 Nicotine dependence, cigarettes, uncomplicated: Secondary | ICD-10-CM | POA: Insufficient documentation

## 2018-05-11 DIAGNOSIS — R0789 Other chest pain: Secondary | ICD-10-CM | POA: Diagnosis not present

## 2018-05-11 DIAGNOSIS — E119 Type 2 diabetes mellitus without complications: Secondary | ICD-10-CM | POA: Diagnosis not present

## 2018-05-11 DIAGNOSIS — Z7984 Long term (current) use of oral hypoglycemic drugs: Secondary | ICD-10-CM | POA: Diagnosis not present

## 2018-05-11 DIAGNOSIS — R079 Chest pain, unspecified: Secondary | ICD-10-CM | POA: Diagnosis present

## 2018-05-11 DIAGNOSIS — I10 Essential (primary) hypertension: Secondary | ICD-10-CM | POA: Insufficient documentation

## 2018-05-11 DIAGNOSIS — Z79899 Other long term (current) drug therapy: Secondary | ICD-10-CM | POA: Diagnosis not present

## 2018-05-11 LAB — CBC
HCT: 36.8 % (ref 36.0–46.0)
HEMOGLOBIN: 11.6 g/dL — AB (ref 12.0–15.0)
MCH: 25.8 pg — AB (ref 26.0–34.0)
MCHC: 31.5 g/dL (ref 30.0–36.0)
MCV: 82 fL (ref 78.0–100.0)
Platelets: 294 10*3/uL (ref 150–400)
RBC: 4.49 MIL/uL (ref 3.87–5.11)
RDW: 15.7 % — AB (ref 11.5–15.5)
WBC: 12 10*3/uL — ABNORMAL HIGH (ref 4.0–10.5)

## 2018-05-11 LAB — BASIC METABOLIC PANEL
ANION GAP: 9 (ref 5–15)
BUN: 9 mg/dL (ref 6–20)
CALCIUM: 8.8 mg/dL — AB (ref 8.9–10.3)
CO2: 24 mmol/L (ref 22–32)
Chloride: 103 mmol/L (ref 98–111)
Creatinine, Ser: 0.69 mg/dL (ref 0.44–1.00)
GFR calc Af Amer: >60 mL/min (ref >60)
GLUCOSE: 151 mg/dL — AB (ref 70–99)
Potassium: 3.6 mmol/L (ref 3.5–5.1)
Sodium: 136 mmol/L (ref 135–145)

## 2018-05-11 LAB — TROPONIN I

## 2018-05-11 NOTE — ED Provider Notes (Signed)
[facility] EMERGENCY DEPARTMENT Provider Note   CSN: [id number] Arrival date & time: 05/11/18  1837     History   Chief Complaint Chief Complaint  Patient presents with  . Chest Pain    HPI Heidi [name] is a 27 y.o. female.  Patient complains of some chest pain shortness of breath and numbness in her arms and legs.  Patient has history of anxiety and she wonders if this is her anxiety acting up  The history is provided by the patient. No language interpreter was used.  Chest Pain   This is a recurrent problem. The current episode started less than 1 hour ago. The problem occurs rarely. The pain is associated with an emotional upset. The pain is present in the substernal region. The pain is at a severity of 2/10. The pain is mild. The quality of the pain is described as brief. The pain does not radiate. Pertinent negatives include no abdominal pain, no back pain, no cough and no headaches.  Pertinent negatives for past medical history include no seizures.    Past Medical History:  Diagnosis Date  . Abdominal pain 01/10/2016  . Anxiety   . Back pain   . Bloating 01/10/2016  . Depression   . GERD (gastroesophageal reflux disease)   . Hematuria 01/10/2016  . Herpes simplex virus (HSV) infection   . Hypertension   . Osteoarthritis   . Prediabetes   . Schizo-affective schizophrenia (HCC)   . Type 2 diabetes mellitus (HCC) 03/28/2016  . Urinary frequency 01/10/2016    Patient Active Problem List   Diagnosis Date Noted  . Status post abdominal supracervical subtotal hysterectomy 06/24/2017  . Anal fissure 05/06/2017  . 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 Morbid obesity (HCC) 04/15/2017  . GERD (gastroesophageal reflux disease) 03/13/2017  . Abnormal liver function tests 03/13/2017  . Abdominal pain, epigastric 03/13/2017  . Type 2 diabetes mellitus (HCC) 03/28/2016  . Paresthesia 01/18/2016  . Neuropathic pain 11/10/2015  . Hypertension associated with  diabetes (HCC) 07/21/2015  . Anxiety and depression 06/22/2015    Past Surgical History:  Procedure Laterality Date  . BILATERAL SALPINGECTOMY Bilateral 06/24/2017   Procedure: BILATERAL SALPINGECTOMY;  Surgeon: Tilda Burrow, MD;  Location: [facility];  Service: Gynecology;  Laterality: Bilateral;  . CESAREAN SECTION    . COLONOSCOPY WITH PROPOFOL N/A 03/17/2017   Procedure: COLONOSCOPY WITH PROPOFOL;  Surgeon: Corbin Ade, MD;  Location: [facility];  Service: Endoscopy;  Laterality: N/A;  9:30am  . DILATION AND CURETTAGE OF UTERUS    . ESOPHAGOGASTRODUODENOSCOPY (EGD) WITH PROPOFOL N/A 03/17/2017   Procedure: ESOPHAGOGASTRODUODENOSCOPY (EGD) WITH PROPOFOL;  Surgeon: Corbin Ade, MD;  Location: [facility];  Service: Endoscopy;  Laterality: N/A;  . ESSURE TUBAL LIGATION    . SUPRACERVICAL ABDOMINAL HYSTERECTOMY N/A 06/24/2017   Procedure: SUPRACERVICAL ABDOMINAL HYSTERECTOMY, WIDE EXCISION OF CICATRIX;  Surgeon: Tilda Burrow, MD;  Location: [facility];  Service: Gynecology;  Laterality: N/A;     OB History    Gravida  2   Para  1   Term      Preterm      AB  1   Living  1     SAB  1   TAB      Ectopic      Multiple      Live Births  1            Home Medications    Prior to Admission  medications   Medication Sig Start Date End Date Taking? Authorizing Provider  albuterol (PROVENTIL HFA;VENTOLIN HFA) 108 (90 Base) MCG/ACT inhaler Inhale 2 puffs into the lungs every 6 (six) hours as needed for wheezing or shortness of breath. 04/18/17  Yes Dettinger, Elige Radon, MD  ALPRAZolam Prudy Feeler) 0.25 MG tablet Take 0.5-1 tablets (0.125-0.25 mg total) by mouth 2 (two) times daily as needed for anxiety (depends on anxiety if takes 05-0 tablet). 09/22/17  Yes Dettinger, Elige Radon, MD  amLODipine (NORVASC) 5 MG tablet TAKE 1 TABLET BY MOUTH ONCE DAILY Patient taking differently: Take 5 mg by mouth every evening.  03/16/18  Yes Dettinger, Elige Radon, MD  buPROPion  (WELLBUTRIN XL) 150 MG 24 hr tablet Take 1 tablet (150 mg total) by mouth daily. 10/20/17  Yes Dettinger, Elige Radon, MD  cyclobenzaprine (FLEXERIL) 10 MG tablet TAKE 1 TABLET BY MOUTH THREE TIMES DAILY AS NEEDED FOR MUSCLE SPASM Patient taking differently: Take 10 mg by mouth at bedtime. *May take up to three times daily as needed for muscle spasms* 01/19/18  Yes Dettinger, Elige Radon, MD  DEXILANT 60 MG capsule TAKE 1 CAPSULE BY MOUTH ONCE DAILY (ENDOSCOPIC FAILURE ON NEXIUM. POOR CONTROL OF SYMPTOMS ON PANTOPRAZOLE) Patient taking differently: Take 60 mg by mouth every evening.  04/24/18  Yes Gelene Mink, NP  lisinopril-hydrochlorothiazide (ZESTORETIC) 20-12.5 MG tablet Take 1 tablet by mouth daily. 10/20/17  Yes Dettinger, Elige Radon, MD  meloxicam (MOBIC) 7.5 MG tablet TAKE 2 TABLETS BY MOUTH ONCE DAILY Patient taking differently: Take 15 mg by mouth every evening.  03/16/18  Yes Dettinger, Elige Radon, MD  metFORMIN (GLUCOPHAGE) 1000 MG tablet TAKE 1 TABLET BY MOUTH TWICE DAILY WITH A MEAL Patient taking differently: Take 1,000 mg by mouth 2 (two) times daily with a meal.  12/22/17  Yes Dettinger, Elige Radon, MD  OLANZapine (ZYPREXA) 10 MG tablet TAKE 1 TABLET BY MOUTH AT BEDTIME 03/25/18  Yes Dettinger, Elige Radon, MD  PRISTIQ 50 MG 24 hr tablet TAKE 3 TABLETS BY MOUTH ONCE DAILY Patient taking differently: Take 150 mg by mouth daily.  05/08/18  Yes Dettinger, Elige Radon, MD  traZODone (DESYREL) 50 MG tablet Take 1 tablet (50 mg total) by mouth at bedtime as needed for sleep. 02/05/17  Yes Dettinger, Elige Radon, MD  glucose blood (ACCU-CHEK GUIDE) test strip Use to check BG once daily at varying times of day. 06/27/17   Dettinger, Elige Radon, MD  Lancets 30G MISC Dispense Accu-Chek guide lancets.  Check blood sugar at varying times once per day. 06/27/17   Dettinger, Elige Radon, MD    Family History Family History  Problem Relation Age of Onset  . Depression Mother   . Hypertension Mother   . Diabetes Mother   .  Mental illness Father   . Hypertension Father   . Diabetes Father   . Cancer Maternal Aunt   . Diabetes Paternal Grandmother   . Hypertension Paternal Grandmother   . Arthritis Paternal Grandmother   . Stroke Paternal Grandfather   . Heart disease Paternal Grandfather   . Hypertension Paternal Grandfather   . Diabetes Paternal Grandfather   . Schizophrenia Maternal Grandfather   . Alcohol abuse Maternal Grandfather   . Other Sister        recovering addict  . Schizophrenia Sister   . Irritable bowel syndrome Sister   . Other Sister        recovering addict  . Bipolar disorder Sister   . Seizures Sister   .  Colon cancer Neg Hx   . Inflammatory bowel disease Neg Hx   . Celiac disease Neg Hx     Social History Social History   Tobacco Use  . Smoking status: Current Every Day Smoker    Packs/day: 1.00    Years: 10.00    Pack years: 10.00    Types: Cigarettes  . Smokeless tobacco: Never Used  Substance Use Topics  . Alcohol use: Yes    Alcohol/week: 0.0 standard drinks    Comment: Occasionally   . Drug use: Not Currently    Types: Marijuana    Comment: Occasionally     Allergies   Hydroxyzine; Linzess [linaclotide]; Lorazepam; Morphine and related; and Quetiapine   Review of Systems Review of Systems  Constitutional: Negative for appetite change and fatigue.  HENT: Negative for congestion, ear discharge and sinus pressure.   Eyes: Negative for discharge.  Respiratory: Negative for cough.   Cardiovascular: Positive for chest pain.  Gastrointestinal: Negative for abdominal pain and diarrhea.  Genitourinary: Negative for frequency and hematuria.  Musculoskeletal: Negative for back pain.  Skin: Negative for rash.  Neurological: Negative for seizures and headaches.  Psychiatric/Behavioral: Negative for hallucinations.     Physical Exam Updated Vital Signs BP 133/80 (BP Location: Right Arm)   Pulse 83   Temp 97.9 F (36.6 C) (Oral)   Resp 18   Ht 5\' 7"   (1.702 m)   Wt 135 kg   LMP 05/28/2017   SpO2 95%   BMI 46.61 kg/m   Physical Exam  Constitutional: She is oriented to person, place, and time. She appears well-developed.  HENT:  Head: Normocephalic.  Eyes: Conjunctivae and EOM are normal. No scleral icterus.  Neck: Neck supple. No thyromegaly present.  Cardiovascular: Normal rate and regular rhythm. Exam reveals no gallop and no friction rub.  No murmur heard. Pulmonary/Chest: No stridor. She has no wheezes. She has no rales. She exhibits no tenderness.  Abdominal: She exhibits no distension. There is no tenderness. There is no rebound.  Musculoskeletal: Normal range of motion. She exhibits no edema.  Lymphadenopathy:    She has no cervical adenopathy.  Neurological: She is oriented to person, place, and time. She exhibits normal muscle tone. Coordination normal.  Skin: No rash noted. No erythema.  Psychiatric: She has a normal mood and affect. Her behavior is normal.     ED Treatments / Results  Labs (all labs ordered are listed, but only abnormal results are displayed) Labs Reviewed  BASIC METABOLIC PANEL - Abnormal; Notable for the following components:      Result Value   Glucose, Bld 151 (*)    Calcium 8.8 (*)    All other components within normal limits  CBC - Abnormal; Notable for the following components:   WBC 12.0 (*)    Hemoglobin 11.6 (*)    MCH 25.8 (*)    RDW 15.7 (*)    All other components within normal limits  TROPONIN I    EKG EKG Interpretation  Date/Time:  Monday May 11 2018 18:53:54 EDT Ventricular Rate:  90 PR Interval:  172 QRS Duration: 94 QT Interval:  372 QTC Calculation: 455 R Axis:   86 Text Interpretation:  Normal sinus rhythm Nonspecific T wave abnormality Abnormal ECG Confirmed by Bethann Berkshire [phone]) on 05/11/2018 10:25:42 PM   Radiology Dg Chest 2 View  Result Date: 05/11/2018 CLINICAL DATA:  Chest pain EXAM: CHEST - 2 VIEW COMPARISON:  07/04/2016 FINDINGS: The heart  size and mediastinal contours  are within normal limits. Both lungs are clear. The visualized skeletal structures are unremarkable. IMPRESSION: No active cardiopulmonary disease. Electronically Signed   By: Jasmine Pang M.D.   On: 05/11/2018 19:22    Procedures Procedures (including critical care time)  Medications Ordered in ED Medications - No data to display   Initial Impression / Assessment and Plan / ED Course  I have reviewed the triage vital signs and the nursing notes.  Pertinent labs & imaging results that were available during my care of the patient were reviewed by me and considered in my medical decision making (see chart for details).    EKG chest x-ray unremarkable.  Labs show glucose 150 and troponin normal.  Patient feels back to her normal self.  Suspect chest discomfort may be related to anxiety.  She will follow-up with her PCP  Final Clinical Impressions(s) / ED Diagnoses   Final diagnoses:  Atypical chest pain    ED Discharge Orders    None       Bethann Berkshire, MD 05/11/18 2235

## 2018-05-11 NOTE — ED Triage Notes (Signed)
Pt states having central chest pain that radiates to left arm x 4 hours.  Pt also c/o of bilateral legs and feet pain as well as neck pain.

## 2018-05-11 NOTE — Discharge Instructions (Addendum)
Follow-up with your family doctor this week if any problems. 

## 2018-05-12 ENCOUNTER — Telehealth: Payer: Self-pay | Admitting: Family Medicine

## 2018-05-12 DIAGNOSIS — F419 Anxiety disorder, unspecified: Secondary | ICD-10-CM

## 2018-05-12 DIAGNOSIS — F325 Major depressive disorder, single episode, in full remission: Secondary | ICD-10-CM

## 2018-05-12 DIAGNOSIS — F329 Major depressive disorder, single episode, unspecified: Secondary | ICD-10-CM

## 2018-05-12 DIAGNOSIS — F32A Depression, unspecified: Secondary | ICD-10-CM

## 2018-05-12 NOTE — Telephone Encounter (Signed)
Referral placed.

## 2018-05-12 NOTE — Telephone Encounter (Signed)
Wants referral to [facility] in [place] for depression/anxiety. Please advise.

## 2018-06-16 ENCOUNTER — Other Ambulatory Visit: Payer: Self-pay | Admitting: Family Medicine

## 2018-06-16 DIAGNOSIS — M545 Low back pain, unspecified: Secondary | ICD-10-CM

## 2018-06-16 DIAGNOSIS — G8929 Other chronic pain: Secondary | ICD-10-CM

## 2018-06-29 NOTE — Progress Notes (Signed)
Psychiatric Initial Adult Assessment   Patient Identification: Heidi [name] MRN:  [id number] Date of Evaluation:  07/02/2018 Referral Source: Junie Spencer, FNP Chief Complaint:  "I have abandonment issues" Visit Diagnosis:    ICD-10-CM   1. Severe episode of recurrent major depressive disorder, without psychotic features (HCC) F33.2     History of Present Illness:   Heidi [name] is a 27 y.o. year old female with a history of [name],  Heidi [name], Heidi [name], Heidi [name], Heidi is referred for [name].   She states that she has been feeling more depressed over the past few months, although she "live with it ([name])" since 9th grade.  She has been more stressed with financial strain. She has had impulsive shopping, although she has been behind two months of bills. She lays out her finances, stating that although she is able to describe this, her "irrational brain takes over me." She also feels scared that her boyfriend, Heidi is at the interview may wake up and not love her anymore (and sobbed). She feels that she is not good enough. She states that she has abandonment issues from prior relationships and her mother.  She becomes tearful describing that she had waited for her mother to pick up at the bus station, although she did not come.  Although she currently has better relationship with her mother, and she tries to "forgive her," she feels that it does not change the fact that she was "hurt."    She feels depressed, feels fatigue, and has anhedonia. She has crying spells in front of customers at work. She has chronic SI with occasional plan to drive towards the tree. She adamantly denies any intent, stating that it is "scary" to do this. She has SIB of banging her head, which occurred last night.when she was very stressed about her finances. It is impulsive act while she does not regret it. She feels irritable. She has  binge eating at times. She has impulsive shopping of buying rocks, books, while she is behind the bills. She denies decreased need for sleep, euphoria. She hears her own internal voice in her head. She has VH of shadows of existence in another planet, although she thinks of it as "spiritual" experience. She drinks 5-6 cups of liquors and juice, twice a week. She uses pot, last use this weekend to feel calmer. She denies other drug use  Heidi [name], her boyfriend presents to the interview.  He believes that she covers most of the story. He denies safety concern.    Wt Readings from Last 3 Encounters:  07/02/18 294 lb (133.4 kg)  05/11/18 297 lb 9.9 oz (135 kg)  04/30/18 298 lb 2 oz (135.2 kg)   Associated Signs/Symptoms: [name] Symptoms:  depressed mood, anhedonia, insomnia, hypersomnia, fatigue, suicidal thoughts without plan, (Hypo) Manic Symptoms:  Financial Extravagance, Irritable Mood, Anxiety Symptoms:  Excessive Worry, Panic Symptoms, Psychotic Symptoms:  Hallucinations: Visual PTSD Symptoms: Had a traumatic exposure:  feeling of abandonment by her mother Re-experiencing:  Flashbacks Intrusive Thoughts Hypervigilance:  Yes Hyperarousal:  Difficulty Concentrating Irritability/Anger Avoidance:  Decreased Interest/Participation  Past Psychiatric History:  Outpatient: [name], anxiety for many years  Psychiatry admission: five times, last in 2015  Previous suicide attempt: denies ,SIB of hitting her head, grazing her arm when she was in high school Past trials of medication: sertraline, fluoxetine, lexapro, pristiq, Wellbutrin, Abilify, latuda, olanzapine, Xanax (drowsiness) History of violence: denies  Previous Psychotropic Medications: Yes   Substance Abuse History  in the last 12 months:  Yes.    Consequences of Substance Abuse: anxiety  Past Medical History:  Past Medical History:  Diagnosis Date  . Abdominal pain 01/10/2016  . Anxiety   . Back pain   .  Bloating 01/10/2016  . [name]   . GERD (gastroesophageal reflux disease)   . Hematuria 01/10/2016  . Herpes simplex virus (HSV) infection   . Heidi [name]   . Osteoarthritis   . Prediabetes   . Schizo-affective schizophrenia (HCC)   . 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 Urinary frequency 01/10/2016    Past Surgical History:  Procedure Laterality Date  . BILATERAL [name] Bilateral 06/24/2017   Procedure: BILATERAL [name];  Surgeon: Tilda Burrow, MD;  Location: [facility];  Service: Gynecology;  Laterality: Bilateral;  . CESAREAN SECTION    . COLONOSCOPY WITH PROPOFOL N/A 03/17/2017   Procedure: COLONOSCOPY WITH PROPOFOL;  Surgeon: Corbin Ade, MD;  Location: [facility];  Service: Endoscopy;  Laterality: N/A;  9:30am  . DILATION AND CURETTAGE OF UTERUS    . ESOPHAGOGASTRODUODENOSCOPY (EGD) WITH PROPOFOL N/A 03/17/2017   Procedure: ESOPHAGOGASTRODUODENOSCOPY (EGD) WITH PROPOFOL;  Surgeon: Corbin Ade, MD;  Location: [facility];  Service: Endoscopy;  Laterality: N/A;  . ESSURE TUBAL LIGATION    . SUPRACERVICAL ABDOMINAL HYSTERECTOMY N/A 06/24/2017   Procedure: SUPRACERVICAL ABDOMINAL HYSTERECTOMY, WIDE EXCISION OF CICATRIX;  Surgeon: Tilda Burrow, MD;  Location: [facility];  Service: Gynecology;  Laterality: N/A;    Family Psychiatric History:  As below, Sister- heroin use, other sister- pill use, mother- [name], maternal aunt- [name], anxiety, heroin use  Family History:  Family History  Problem Relation Age of Onset  . [name] Mother   . Heidi [name] Mother   . [name] Mother   . Mental illness Father   . Heidi [name] Father   . [name] Father   . Anxiety disorder Father   . Cancer Maternal Aunt   . [name] Paternal Grandmother   . Heidi [name] Paternal Grandmother   . Arthritis Paternal Grandmother   . Stroke Paternal Grandfather   . Heart disease Paternal Grandfather   . Heidi [name] Paternal Grandfather   . [name]  Paternal Grandfather   . Schizophrenia Maternal Grandfather   . Alcohol abuse Maternal Grandfather   . Other Sister        recovering addict  . Schizophrenia Sister   . Irritable bowel syndrome Sister   . Drug abuse Sister   . Other Sister        recovering addict  . Bipolar disorder Sister   . Seizures Sister   . Drug abuse Sister   . Schizophrenia Sister   . [name] Paternal Aunt   . Anxiety disorder Paternal Aunt   . Colon cancer Neg Hx   . Inflammatory bowel disease Neg Hx   . Celiac disease Neg Hx     Social History:   Social History   Socioeconomic History  . Marital Heidi: Legally Separated    Spouse name: Not on file  . Number of children: 1  . Years of education: HS  . Highest education level: Not on file  Occupational History  . Occupation: Unemployed  Social Needs  . Financial resource strain: Not on file  . Food insecurity:    Worry: Not on file    Inability: Not on file  . Transportation needs:    Medical: Not on file    Non-medical: Not on file  Tobacco Use  . Smoking Heidi:  Current Every Day Smoker    Packs/day: 1.00    Years: 10.00    Pack years: 10.00    Types: Cigarettes  . Smokeless tobacco: Never Used  Substance and Sexual Activity  . Alcohol use: Yes    Alcohol/week: 0.0 standard drinks    Comment: Occasionally   . Drug use: Not Currently    Types: Marijuana    Comment: Occasionally  . Sexual activity: Not Currently    Birth control/protection: Surgical    Comment: tubal  Lifestyle  . Physical activity:    Days per week: Not on file    Minutes per session: Not on file  . Stress: Not on file  Relationships  . Social connections:    Talks on phone: Not on file    Gets together: Not on file    Attends religious service: Not on file    Active member of club or organization: Not on file    Attends meetings of clubs or organizations: Not on file    Relationship Heidi: Not on file  Other Topics Concern  . Not on file  Social  History Narrative   Lives at home with her boyfriend.   Right-handed.   Drinks approximately two 2-liter sodas per day.   Drinks 1 cup coffee per day.    Additional Social History:  She lives with her boyfriend, father. Divorced, she has 74 year old son Education: graduated from college Work: Conservation officer, nature for seven months  Allergies:   Allergies  Allergen Reactions  . Hydroxyzine     Causes her more anxiety.  [name] [Linaclotide] Hives  . Lorazepam     Sedation  . Morphine And Related Other (See Comments)    Makes pt.aggressive  . Quetiapine     Sweating, irritability    Metabolic Disorder Labs: Lab Results  Component Value Date   HGBA1C 9.3 (H) 06/20/2017   MPG 220.21 06/20/2017   No results found for: PROLACTIN Lab Results  Component Value Date   CHOL 149 03/13/2018   TRIG 287 (H) 03/13/2018   HDL 26 (L) 03/13/2018   CHOLHDL 5.7 (H) 03/13/2018   LDLCALC 66 03/13/2018   LDLCALC 68 08/29/2017     Current Medications: Current Outpatient Medications  Medication Sig Dispense Refill  . albuterol (PROVENTIL HFA;VENTOLIN HFA) 108 (90 Base) MCG/ACT inhaler Inhale 2 puffs into the lungs every 6 (six) hours as needed for wheezing or shortness of breath. 1 Inhaler 2  . ALPRAZolam (XANAX) 0.25 MG tablet Take 0.5-1 tablets (0.125-0.25 mg total) by mouth 2 (two) times daily as needed for anxiety (depends on anxiety if takes 05-0 tablet). 15 tablet 2  . amLODipine (NORVASC) 5 MG tablet TAKE 1 TABLET BY MOUTH ONCE DAILY (Patient taking differently: Take 5 mg by mouth every evening. ) 90 tablet 1  . buPROPion (WELLBUTRIN XL) 300 MG 24 hr tablet Take 1 tablet (300 mg total) by mouth daily. 30 tablet 0  . cyclobenzaprine (FLEXERIL) 10 MG tablet TAKE 1 TABLET BY MOUTH THREE TIMES DAILY AS NEEDED FOR MUSCLE SPASM (Patient taking differently: Take 10 mg by mouth at bedtime. *May take up to three times daily as needed for muscle spasms*) 30 tablet 2  . DEXILANT 60 MG capsule TAKE 1 CAPSULE  BY MOUTH ONCE DAILY (ENDOSCOPIC FAILURE ON NEXIUM. POOR CONTROL OF SYMPTOMS ON PANTOPRAZOLE) (Patient taking differently: Take 60 mg by mouth every evening. ) 30 capsule 11  . glucose blood (ACCU-CHEK GUIDE) test strip Use to check BG once  daily at varying times of day. 100 each 3  . Lancets 30G MISC Dispense Accu-Chek guide lancets.  Check blood sugar at varying times once per day. 100 each 11  . lisinopril-hydrochlorothiazide (ZESTORETIC) 20-12.5 MG tablet Take 1 tablet by mouth daily. 90 tablet 3  . meloxicam (MOBIC) 7.5 MG tablet TAKE 2 TABLETS BY MOUTH ONCE DAILY 60 tablet 2  . metFORMIN (GLUCOPHAGE) 1000 MG tablet TAKE 1 TABLET BY MOUTH TWICE DAILY WITH A MEAL (Patient taking differently: Take 1,000 mg by mouth 2 (two) times daily with a meal. ) 60 tablet 3  . PRISTIQ 50 MG 24 hr tablet TAKE 3 TABLETS BY MOUTH ONCE DAILY (Patient taking differently: Take 150 mg by mouth daily. ) 270 tablet 0  . traZODone (DESYREL) 50 MG tablet Take 1 tablet (50 mg total) by mouth at bedtime as needed for sleep. 30 tablet 2  . ARIPiprazole (ABILIFY) 5 MG tablet Take 1 tablet (5 mg total) by mouth daily. 30 tablet 0   Current Facility-Administered Medications  Medication Dose Route Frequency Provider Last Rate Last Dose  . methylPREDNISolone acetate (DEPO-MEDROL) injection 80 mg  80 mg Intramuscular Once Dettinger, Elige Radon, MD      . methylPREDNISolone acetate (DEPO-MEDROL) injection 80 mg  80 mg Intramuscular Once Dettinger, Elige Radon, MD        Neurologic: Headache: No Seizure: No Paresthesias:No  Musculoskeletal: Strength & Muscle Tone: within normal limits Gait & Station: normal Patient leans: N/A  Psychiatric Specialty Exam: Review of Systems  Psychiatric/Behavioral: Positive for [name], hallucinations, substance abuse and suicidal ideas. Negative for memory loss. The patient is nervous/anxious and has insomnia.   All other systems reviewed and are negative.   Blood pressure 123/80, pulse  96, height 5\' 7"  (1.702 m), weight 294 lb (133.4 kg), last menstrual period 05/28/2017, SpO2 97 %.Body mass index is 46.05 kg/m.  General Appearance: Fairly Groomed  Eye Contact:  Good  Speech:  Clear and Coherent  Volume:  Normal  Mood:  Depressed  Affect:  Appropriate, Congruent, Restricted, Tearful and down- smiles at the end of the interview  Thought Process:  Coherent  Orientation:  Full (Time, Place, and Person)  Thought Content:  Logical  Suicidal Thoughts:  Yes.  without intent/plan  Homicidal Thoughts:  No  Memory:  Immediate;   Good  Judgement:  Good  Insight:  Present  Psychomotor Activity:  Normal  Concentration:  Concentration: Good and Attention Span: Good  Recall:  Good  Fund of Knowledge:Good  Language: Good  Akathisia:  No  Handed:  Right  AIMS (if indicated):  N/A  Assets:  Communication Skills Desire for Improvement  ADL's:  Intact  Cognition: WNL  Sleep:  poor   Assessment Dandrea Medders is a 27 y.o. year old female with a history of [name],  Heidi [name], Heidi [name], Heidi [name], Heidi is referred for [name].   # MDD, severe, recurrent without psychotic features # r/o PTSD Patient reports worsening in depressive symptoms with passive SI, SIB and anxiety.  Psychosocial stressors including financial strain.  She also reports feeling of abandonment from prior relationship and her mother as a child.  Will uptitrate Wellbutrin to target [name].  Discussed risk of worsening anxiety and headache.  She has no known history of seizure.  Will continue Pristiq to target [name].  Will switch from olanzapine to Abilify as adjunctive treatment for [name] given Abilify has less metabolic side effect.  Discussed risk of EPS.  She  does have cluster B traits, although this needs to be explored longitudinally.  She will greatly benefit from CBT/DBT; will make a referral. (medicaid)  # Alcohol use #  Marijuana use Patient reports slightly escalated use of alcohol, and has chronic marijuana use.  Will continue motivational interview.   Plan 1. Continue Prestiq 150 mg daily   2. Increase Wellbutrin 300 mg daily  3. Discontinue olanzapine 4. Start Abilify  5 mg at night  5. Return to clinic in one month for 30 mins 6. Referral to therapy  - on Xanax 0.125 mg daily as needed  - will ask more about her 57 year old son at the next visit  The patient demonstrates the following risk factors for suicide: Chronic risk factors for suicide include: psychiatric disorder of [name], substance use disorder and previous self-harm of banging her head. Acute risk factors for suicide include: N/A. Protective factors for this patient include: positive social support and hope for the future. Considering these factors, the overall suicide risk at this point appears to be chronically elevated, but not at imminent risk. She is future oriented and is amenable to treatment plan. Patient is appropriate for outpatient follow up. Emergency resources which includes 911, ED, suicide crisis line [phone]) are discussed. She denies gun access at home   Treatment Plan Summary: Plan as above   [name], MD 10/31/201912:41 PM

## 2018-07-02 ENCOUNTER — Ambulatory Visit (HOSPITAL_COMMUNITY): Payer: Medicaid Other | Admitting: Psychiatry

## 2018-07-02 ENCOUNTER — Encounter (HOSPITAL_COMMUNITY): Payer: Self-pay | Admitting: Psychiatry

## 2018-07-02 VITALS — BP 123/80 | HR 96 | Ht 67.0 in | Wt 294.0 lb

## 2018-07-02 DIAGNOSIS — F332 Major depressive disorder, recurrent severe without psychotic features: Secondary | ICD-10-CM | POA: Diagnosis not present

## 2018-07-02 DIAGNOSIS — G4700 Insomnia, unspecified: Secondary | ICD-10-CM | POA: Diagnosis not present

## 2018-07-02 DIAGNOSIS — F419 Anxiety disorder, unspecified: Secondary | ICD-10-CM

## 2018-07-02 MED ORDER — ARIPIPRAZOLE 5 MG PO TABS: 5.0000 mg | ORAL_TABLET | Freq: Every day | ORAL | 0 refills | Status: DC

## 2018-07-02 MED ORDER — BUPROPION HCL ER (XL) 300 MG PO TB24: 300.0000 mg | ORAL_TABLET | Freq: Every day | ORAL | 0 refills | Status: DC

## 2018-07-02 NOTE — Patient Instructions (Signed)
1. Continue Prestiq 150 mg daily   2. Increase Wellbutrin 300 mg daily  3. Discontinue olanzapine 4. Start Abilify  5 mg at night  5. Return to clinic in one month for 30 mins 6. Referral to therapy

## 2018-07-06 ENCOUNTER — Other Ambulatory Visit: Payer: Self-pay | Admitting: Family Medicine

## 2018-07-21 ENCOUNTER — Encounter: Payer: Self-pay | Admitting: Advanced Practice Midwife

## 2018-07-21 ENCOUNTER — Ambulatory Visit: Payer: Medicaid Other | Admitting: Advanced Practice Midwife

## 2018-07-21 ENCOUNTER — Ambulatory Visit (INDEPENDENT_AMBULATORY_CARE_PROVIDER_SITE_OTHER): Payer: Medicaid Other

## 2018-07-21 ENCOUNTER — Other Ambulatory Visit: Payer: Self-pay

## 2018-07-21 VITALS — BP 138/80 | HR 94 | Ht 67.0 in | Wt 285.0 lb

## 2018-07-21 DIAGNOSIS — Z113 Encounter for screening for infections with a predominantly sexual mode of transmission: Secondary | ICD-10-CM | POA: Diagnosis not present

## 2018-07-21 DIAGNOSIS — Z23 Encounter for immunization: Secondary | ICD-10-CM

## 2018-07-21 NOTE — Progress Notes (Signed)
[facility] Visit  Patient name: Heidi [name] MRN [id number]  Date of birth: 06/27/1991  CC & HPI:  Heidi MyrtleJessica Krontz is a 27 y.o. Caucasian female presenting today for STD screening. She is here w/her boyfriend of 3 years . 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 Has never had an outbreak. Discussed that there is a 50% false + rate w/blood test, and right now there is not a more accurate test available.  Wants to proceed with "all" STD testing.  No sx  Pertinent History Reviewed:  Medical & Surgical Hx:   Past Medical History:  Diagnosis Date  . Abdominal pain 01/10/2016  . Anxiety   . Back pain   . Bloating 01/10/2016  . Depression   . GERD (gastroesophageal reflux disease)   . Hematuria 01/10/2016  . Herpes simplex virus (HSV) infection   . Hypertension   . Osteoarthritis   . Prediabetes   . Schizo-affective schizophrenia (HCC)   . Type 2 diabetes mellitus (HCC) 03/28/2016  . Urinary frequency 01/10/2016   Past Surgical History:  Procedure Laterality Date  . BILATERAL SALPINGECTOMY Bilateral 06/24/2017   Procedure: BILATERAL SALPINGECTOMY;  Surgeon: Tilda BurrowFerguson, John V, MD;  Location: [facility];  Service: Gynecology;  Laterality: Bilateral;  . CESAREAN SECTION    . COLONOSCOPY WITH PROPOFOL N/A 03/17/2017   Procedure: COLONOSCOPY WITH PROPOFOL;  Surgeon: Corbin Adeourk, Robert M, MD;  Location: [facility];  Service: Endoscopy;  Laterality: N/A;  9:30am  . DILATION AND CURETTAGE OF UTERUS    . ESOPHAGOGASTRODUODENOSCOPY (EGD) WITH PROPOFOL N/A 03/17/2017   Procedure: ESOPHAGOGASTRODUODENOSCOPY (EGD) WITH PROPOFOL;  Surgeon: Corbin Adeourk, Robert M, MD;  Location: [facility];  Service: Endoscopy;  Laterality: N/A;  . ESSURE TUBAL LIGATION    . SUPRACERVICAL ABDOMINAL HYSTERECTOMY N/A 06/24/2017   Procedure: SUPRACERVICAL ABDOMINAL HYSTERECTOMY, WIDE EXCISION OF CICATRIX;  Surgeon: Tilda BurrowFerguson, John V, MD;  Location: [facility];  Service: Gynecology;  Laterality: N/A;   Family  History  Problem Relation Age of Onset  . Depression Mother   . Hypertension Mother   . Diabetes Mother   . Mental illness Father   . Hypertension Father   . Diabetes Father   . Anxiety disorder Father   . Cancer Maternal Aunt   . Diabetes Paternal Grandmother   . Hypertension Paternal Grandmother   . Arthritis Paternal Grandmother   . Stroke Paternal Grandfather   . Heart disease Paternal Grandfather   . Hypertension Paternal Grandfather   . Diabetes Paternal Grandfather   . Schizophrenia Maternal Grandfather   . Alcohol abuse Maternal Grandfather   . Other Sister        recovering addict  . Schizophrenia Sister   . Irritable bowel syndrome Sister   . Drug abuse Sister   . Other Sister        recovering addict  . Bipolar disorder Sister   . Seizures Sister   . Drug abuse Sister   . Schizophrenia Sister   . Depression Paternal Aunt   . Anxiety disorder Paternal Aunt   . Colon cancer Neg Hx   . Inflammatory bowel disease Neg Hx   . Celiac disease Neg Hx     Current Outpatient Medications:  .  albuterol (PROVENTIL HFA;VENTOLIN HFA) 108 (90 Base) MCG/ACT inhaler, Inhale 2 puffs into the lungs every 6 (six) hours as needed for wheezing or shortness of breath., Disp: 1 Inhaler, Rfl: 2 .  ALPRAZolam (XANAX) 0.25 MG tablet, Take 0.5-1 tablets (0.125-0.25 mg  total) by mouth 2 (two) times daily as needed for anxiety (depends on anxiety if takes 05-0 tablet)., Disp: 15 tablet, Rfl: 2 .  amLODipine (NORVASC) 5 MG tablet, TAKE 1 TABLET BY MOUTH ONCE DAILY (Patient taking differently: Take 5 mg by mouth every evening. ), Disp: 90 tablet, Rfl: 1 .  ARIPiprazole (ABILIFY) 5 MG tablet, Take 1 tablet (5 mg total) by mouth daily., Disp: 30 tablet, Rfl: 0 .  buPROPion (WELLBUTRIN XL) 300 MG 24 hr tablet, Take 1 tablet (300 mg total) by mouth daily., Disp: 30 tablet, Rfl: 0 .  cyclobenzaprine (FLEXERIL) 10 MG tablet, TAKE 1 TABLET BY MOUTH THREE TIMES DAILY AS NEEDED FOR MUSCLE SPASM, Disp: 30  tablet, Rfl: 2 .  DEXILANT 60 MG capsule, TAKE 1 CAPSULE BY MOUTH ONCE DAILY (ENDOSCOPIC FAILURE ON NEXIUM. POOR CONTROL OF SYMPTOMS ON PANTOPRAZOLE) (Patient taking differently: Take 60 mg by mouth every evening. ), Disp: 30 capsule, Rfl: 11 .  glucose blood (ACCU-CHEK GUIDE) test strip, Use to check BG once daily at varying times of day., Disp: 100 each, Rfl: 3 .  lisinopril-hydrochlorothiazide (ZESTORETIC) 20-12.5 MG tablet, Take 1 tablet by mouth daily., Disp: 90 tablet, Rfl: 3 .  meloxicam (MOBIC) 7.5 MG tablet, TAKE 2 TABLETS BY MOUTH ONCE DAILY, Disp: 60 tablet, Rfl: 2 .  metFORMIN (GLUCOPHAGE) 1000 MG tablet, TAKE 1 TABLET BY MOUTH TWICE DAILY WITH A MEAL (Patient taking differently: Take 1,000 mg by mouth 2 (two) times daily with a meal. ), Disp: 60 tablet, Rfl: 3 .  PRISTIQ 50 MG 24 hr tablet, TAKE 3 TABLETS BY MOUTH ONCE DAILY (Patient taking differently: Take 150 mg by mouth daily. ), Disp: 270 tablet, Rfl: 0 .  traZODone (DESYREL) 50 MG tablet, Take 1 tablet (50 mg total) by mouth at bedtime as needed for sleep., Disp: 30 tablet, Rfl: 2 .  Lancets 30G MISC, Dispense Accu-Chek guide lancets.  Check blood sugar at varying times once per day. (Patient not taking: Reported on 07/21/2018), Disp: 100 each, Rfl: 11  Current Facility-Administered Medications:  .  methylPREDNISolone acetate (DEPO-MEDROL) injection 80 mg, 80 mg, Intramuscular, Once, Dettinger, Elige Radon, MD .  methylPREDNISolone acetate (DEPO-MEDROL) injection 80 mg, 80 mg, Intramuscular, Once, Dettinger, Elige Radon, MD Social History: Reviewed -  reports that she has been smoking cigarettes. She has a 10.00 pack-year smoking history. She has never used smokeless tobacco.  Review of Systems:   Constitutional: Negative for fever and chills Eyes: Negative for visual disturbances Respiratory: Negative for shortness of breath, dyspnea Cardiovascular: Negative for chest pain or palpitations  Gastrointestinal: Negative for vomiting,  diarrhea and constipation; no abdominal pain Genitourinary: Negative for dysuria and urgency, vaginal irritation or itching Musculoskeletal: Negative for back pain, joint pain, myalgias  Neurological: Negative for dizziness and headaches    Objective Findings:    Physical Examination: Vitals:   07/21/18 1212  BP: 138/80  Pulse: 94   General appearance - well appearing, and in no distress Mental status - alert, oriented to person, place, and time Chest:  Normal respiratory effort Heart - normal rate and regular rhythm Abdomen:  Soft, nontender Pelvic: appears normal.  Musculoskeletal:  Normal range of motion without pain Extremities:  No edema    No results found for this or any previous visit (from the past 24 hour(s)).    Assessment & Plan:  A:   STD screening P:  NuSwab, HIV/RPR    Return for If you have any problems.  Scarlette Calico Cresenzo-Dishmon CNM 07/21/2018  1:52 PM

## 2018-07-22 LAB — RPR: RPR Ser Ql: NONREACTIVE

## 2018-07-22 LAB — HIV ANTIBODY (ROUTINE TESTING W REFLEX): HIV SCREEN 4TH GENERATION: NONREACTIVE

## 2018-07-23 ENCOUNTER — Other Ambulatory Visit: Payer: Self-pay | Admitting: Advanced Practice Midwife

## 2018-07-23 MED ORDER — AZITHROMYCIN 500 MG PO TABS: 1000.0000 mg | ORAL_TABLET | Freq: Once | ORAL | 0 refills | Status: AC

## 2018-07-23 NOTE — Progress Notes (Signed)
Azithromycin 1gm for CHL 

## 2018-07-24 LAB — NUSWAB VAGINITIS PLUS (VG+)
CHLAMYDIA TRACHOMATIS, NAA: POSITIVE — AB
Candida albicans, NAA: NEGATIVE
Candida glabrata, NAA: NEGATIVE
NEISSERIA GONORRHOEAE, NAA: NEGATIVE
Trich vag by NAA: NEGATIVE

## 2018-07-27 ENCOUNTER — Telehealth: Payer: Self-pay | Admitting: *Deleted

## 2018-07-27 ENCOUNTER — Encounter: Payer: Self-pay | Admitting: *Deleted

## 2018-07-27 NOTE — Telephone Encounter (Signed)
Pt notified of labs

## 2018-07-28 ENCOUNTER — Other Ambulatory Visit: Payer: Self-pay | Admitting: Women's Health

## 2018-07-28 NOTE — Progress Notes (Signed)
Called in azithromycin 1gm po x 1 for partner Lanae CrumblyCasey Brown, DOB: [birth year], [facility]  Cheral MarkerKimberly R. Emile Kyllo, CNM, [facility]NP-BC 07/28/2018 9:14 AM

## 2018-07-29 NOTE — Progress Notes (Signed)
BH MD/PA/NP OP Progress Note  08/04/2018 4:06 PM Heidi [name]  MRN:  [id number]  Chief Complaint:  Chief Complaint    Follow-up; Depression     HPI:  Patient presents for follow-up appointment for depression.  She states that she has been feeling less depressed since the last visit.  She has good relationship with Heidi [name].  She found out his infidelity in September.  She thinks that it was good for her to find out for "good" change.  She feels that they have been more intimate with each other.  She also has good trusting Heidi [name]. Although she still feels scared to think if Heidi LyonsCasey were to leave the patient, the part of the patient knows that he would not leave.  She also feels good that she has being able to complete her Christmas shopping.  She even bought presents for adult, which she has not done before.  She is able to afford those this year as she has job.  She occasionally feels jealous about other people, who has home, or good payment.  It makes her feel depressed and have passive SI. She occasionally hit her head with fist, which resulted in headache the next day.  She agrees to try stressful instead.  She has hypersomnia to insomnia as she tries to wait for Heidi LyonsCasey to be back home.  She feels less depressed.  She has fair motivation.  She has fair concentration. She has not drink since last Saturday as she realized that she drank too much. She has not used marijuana since last Saturday for same reason. However, she states that she may use it to feel better. She occasionally drinks 6-7 shots at times, although she denies any use when she has her 35six year old son. She denies craving for alcohol.     Wt Readings from Last 3 Encounters:  08/04/18 284 lb (128.8 kg)  07/21/18 285 lb (129.3 kg)  07/02/18 294 lb (133.4 kg)    Visit Diagnosis:    ICD-10-CM   1. MDD (major depressive disorder), recurrent episode, moderate (HCC) F33.1     Past Psychiatric History: Please see initial evaluation for  full details. I have reviewed the history. No updates at this time.     Past Medical History:  Past Medical History:  Diagnosis Date  . Abdominal pain 01/10/2016  . Anxiety   . Back pain   . Bloating 01/10/2016  . Depression   . GERD (gastroesophageal reflux disease)   . Hematuria 01/10/2016  . Herpes simplex virus (HSV) infection   . Hypertension   . Osteoarthritis   . Prediabetes   . Schizo-affective schizophrenia (HCC)   . Type 2 diabetes mellitus (HCC) 03/28/2016  . Urinary frequency 01/10/2016    Past Surgical History:  Procedure Laterality Date  . BILATERAL SALPINGECTOMY Bilateral 06/24/2017   Procedure: BILATERAL SALPINGECTOMY;  Surgeon: Tilda BurrowFerguson, John V, MD;  Location: [facility];  Service: Gynecology;  Laterality: Bilateral;  . CESAREAN SECTION    . COLONOSCOPY WITH PROPOFOL N/A 03/17/2017   Procedure: COLONOSCOPY WITH PROPOFOL;  Surgeon: Corbin Adeourk, Robert M, MD;  Location: [facility];  Service: Endoscopy;  Laterality: N/A;  9:30am  . DILATION AND CURETTAGE OF UTERUS    . ESOPHAGOGASTRODUODENOSCOPY (EGD) WITH PROPOFOL N/A 03/17/2017   Procedure: ESOPHAGOGASTRODUODENOSCOPY (EGD) WITH PROPOFOL;  Surgeon: Corbin Adeourk, Robert M, MD;  Location: [facility];  Service: Endoscopy;  Laterality: N/A;  . ESSURE TUBAL LIGATION    . SUPRACERVICAL ABDOMINAL HYSTERECTOMY N/A 06/24/2017   Procedure:  SUPRACERVICAL ABDOMINAL HYSTERECTOMY, WIDE EXCISION OF CICATRIX;  Surgeon: Tilda Burrow, MD;  Location: [facility];  Service: Gynecology;  Laterality: N/A;    Family Psychiatric History: Please see initial evaluation for full details. I have reviewed the history. No updates at this time.     Family History:  Family History  Problem Relation Age of Onset  . Depression Mother   . Hypertension Mother   . Diabetes Mother   . Mental illness Father   . Hypertension Father   . Diabetes Father   . Anxiety disorder Father   . Cancer Maternal Aunt   . Diabetes Paternal Grandmother   . Hypertension  Paternal Grandmother   . Arthritis Paternal Grandmother   . Stroke Paternal Grandfather   . Heart disease Paternal Grandfather   . Hypertension Paternal Grandfather   . Diabetes Paternal Grandfather   . Schizophrenia Maternal Grandfather   . Alcohol abuse Maternal Grandfather   . Other Sister        recovering addict  . Schizophrenia Sister   . Irritable bowel syndrome Sister   . Drug abuse Sister   . Other Sister        recovering addict  . Bipolar disorder Sister   . Seizures Sister   . Drug abuse Sister   . Schizophrenia Sister   . Depression Paternal Aunt   . Anxiety disorder Paternal Aunt   . Colon cancer Neg Hx   . Inflammatory bowel disease Neg Hx   . Celiac disease Neg Hx     Social History:  Social History   Socioeconomic History  . Marital status: Legally Separated    Spouse name: Not on file  . Number of children: 1  . Years of education: HS  . Highest education level: Not on file  Occupational History  . Occupation: Unemployed  Social Needs  . Financial resource strain: Not on file  . Food insecurity:    Worry: Not on file    Inability: Not on file  . Transportation needs:    Medical: Not on file    Non-medical: Not on file  Tobacco Use  . Smoking status: Current Every Day Smoker    Packs/day: 1.00    Years: 10.00    Pack years: 10.00    Types: Cigarettes  . Smokeless tobacco: Never Used  Substance and Sexual Activity  . Alcohol use: Yes    Alcohol/week: 0.0 standard drinks    Comment: Occasionally   . Drug use: Yes    Types: Marijuana    Comment: Occasionally  . Sexual activity: Yes    Birth control/protection: Surgical    Comment: tubal  Lifestyle  . Physical activity:    Days per week: Not on file    Minutes per session: Not on file  . Stress: Not on file  Relationships  . Social connections:    Talks on phone: Not on file    Gets together: Not on file    Attends religious service: Not on file    Active member of club or  organization: Not on file    Attends meetings of clubs or organizations: Not on file    Relationship status: Not on file  Other Topics Concern  . Not on file  Social History Narrative   Lives at home with her boyfriend.   Right-handed.   Drinks approximately two 2-liter sodas per day.   Drinks 1 cup coffee per day.    Allergies:  Allergies  Allergen Reactions  .  Hydroxyzine     Causes her more anxiety.  [name] [Linaclotide] Hives  . Lorazepam     Sedation  . Morphine And Related Other (See Comments)    Makes pt.aggressive  . Quetiapine     Sweating, irritability    Metabolic Disorder Labs: Lab Results  Component Value Date   HGBA1C 9.3 (H) 06/20/2017   MPG 220.21 06/20/2017   No results found for: PROLACTIN Lab Results  Component Value Date   CHOL 149 03/13/2018   TRIG 287 (H) 03/13/2018   HDL 26 (L) 03/13/2018   CHOLHDL 5.7 (H) 03/13/2018   LDLCALC 66 03/13/2018   LDLCALC 68 08/29/2017   Lab Results  Component Value Date   TSH 0.890 04/30/2018   TSH 1.930 09/26/2015    Therapeutic Level Labs: No results found for: LITHIUM No results found for: VALPROATE No components found for:  CBMZ  Current Medications: Current Outpatient Medications  Medication Sig Dispense Refill  . albuterol (PROVENTIL HFA;VENTOLIN HFA) 108 (90 Base) MCG/ACT inhaler Inhale 2 puffs into the lungs every 6 (six) hours as needed for wheezing or shortness of breath. 1 Inhaler 2  . ALPRAZolam (XANAX) 0.25 MG tablet Take 0.5-1 tablets (0.125-0.25 mg total) by mouth 2 (two) times daily as needed for anxiety (depends on anxiety if takes 05-0 tablet). 15 tablet 2  . amLODipine (NORVASC) 5 MG tablet TAKE 1 TABLET BY MOUTH ONCE DAILY (Patient taking differently: Take 5 mg by mouth every evening. ) 90 tablet 1  . ARIPiprazole (ABILIFY) 5 MG tablet Take 1 tablet (5 mg total) by mouth daily. 90 tablet 0  . buPROPion (WELLBUTRIN XL) 300 MG 24 hr tablet Take 1 tablet (300 mg total) by mouth daily.  90 tablet 0  . cyclobenzaprine (FLEXERIL) 10 MG tablet TAKE 1 TABLET BY MOUTH THREE TIMES DAILY AS NEEDED FOR MUSCLE SPASM 30 tablet 2  . DEXILANT 60 MG capsule TAKE 1 CAPSULE BY MOUTH ONCE DAILY (ENDOSCOPIC FAILURE ON NEXIUM. POOR CONTROL OF SYMPTOMS ON PANTOPRAZOLE) (Patient taking differently: Take 60 mg by mouth every evening. ) 30 capsule 11  . glucose blood (ACCU-CHEK GUIDE) test strip Use to check BG once daily at varying times of day. 100 each 3  . Lancets 30G MISC Dispense Accu-Chek guide lancets.  Check blood sugar at varying times once per day. 100 each 11  . lisinopril-hydrochlorothiazide (ZESTORETIC) 20-12.5 MG tablet Take 1 tablet by mouth daily. 90 tablet 3  . meloxicam (MOBIC) 7.5 MG tablet TAKE 2 TABLETS BY MOUTH ONCE DAILY 60 tablet 2  . metFORMIN (GLUCOPHAGE) 1000 MG tablet TAKE 1 TABLET BY MOUTH TWICE DAILY WITH A MEAL (Patient taking differently: Take 1,000 mg by mouth 2 (two) times daily with a meal. ) 60 tablet 3  . PRISTIQ 50 MG 24 hr tablet TAKE 3 TABLETS BY MOUTH ONCE DAILY (Patient taking differently: Take 150 mg by mouth daily. ) 270 tablet 0  . traZODone (DESYREL) 50 MG tablet Take 1 tablet (50 mg total) by mouth at bedtime as needed for sleep. 30 tablet 2   Current Facility-Administered Medications  Medication Dose Route Frequency Provider Last Rate Last Dose  . methylPREDNISolone acetate (DEPO-MEDROL) injection 80 mg  80 mg Intramuscular Once Dettinger, Elige Radon, MD      . methylPREDNISolone acetate (DEPO-MEDROL) injection 80 mg  80 mg Intramuscular Once Dettinger, Elige Radon, MD         Musculoskeletal: Strength & Muscle Tone: within normal limits Gait & Station: normal Patient leans: N/A  Psychiatric Specialty Exam: Review of Systems  Psychiatric/Behavioral: Positive for depression, hallucinations and suicidal ideas. Negative for memory loss and substance abuse. The patient is nervous/anxious and has insomnia.   All other systems reviewed and are negative.    Blood pressure 135/85, pulse (!) 101, height 5\' 7"  (1.702 m), weight 284 lb (128.8 kg), last menstrual period 05/28/2017, SpO2 100 %.Body mass index is 44.48 kg/m.  General Appearance: Fairly Groomed  Eye Contact:  Good  Speech:  Clear and Coherent  Volume:  Normal  Mood:  "good"  Affect:  Appropriate, Congruent and euthymic  Thought Process:  Coherent  Orientation:  Full (Time, Place, and Person)  Thought Content: Logical   Suicidal Thoughts:  Yes.  without intent/plan  Homicidal Thoughts:  No  Memory:  Immediate;   Good  Judgement:  Fair  Insight:  Present  Psychomotor Activity:  Normal  Concentration:  Concentration: Good and Attention Span: Good  Recall:  Good  Fund of Knowledge: Good  Language: Good  Akathisia:  No  Handed:  Right  AIMS (if indicated): not done  Assets:  Communication Skills Desire for Improvement  ADL's:  Intact  Cognition: WNL  Sleep:  hypersomnia to insomnia   Screenings: GAD-7     Virtual BH Phone Follow Up from 10/15/2017 in [facility]  Total GAD-7 Score  0    PHQ2-9     Office Visit from 04/23/2018 in [facility] Office Visit from 04/15/2018 in [facility] Office Visit from 03/13/2018 in [facility] Office Visit from 12/18/2017 in [facility] Office Visit from 11/21/2017 in [facility]  PHQ-2 Total Score  2  2  2  2  2   PHQ-9 Total Score  6  9  9  5  4        Assessment and Plan:  Heidi [name] is a 27 y.o. year old female with a history of depression,  type II diabetes, hypertension, status post supracervical hysterectomywith bilateral salpingectomy, who presents for follow up appointment for MDD (major depressive disorder), recurrent episode, moderate (HCC)  # MDD, severe, recurrent without psychotic features # r/o PTSD Exam is notable for more reactive affect, and patient reports improvement in depressive  symptoms, while she continues to have passive SI and SIB.  Psychosocial stressors including financial strain.  She also reports feeling of abandonment from prior relationships and her mother as a child.  Will continue Wellbutrin to target depression.  Will continue Pristiq for depression.  Will continue Abilify as adjunctive treatment for depression.  Noted that she has good weight loss since switching from olanzapine to Abilify.  Discussed risk of EPS.  She does have cluster B traits with very low self-esteem, although this needs to be explored longitudinally.  Referral was made for CBT/DBT.  Will contact with them again.   # Alcohol use  # Marijuana use Although she has limited insight into alcohol and marijuana use, she has been trying to cut down its use. She is not interested in pharmacological management.  Will continue motivational interview.   Plan I have reviewed and updated plans as below 1. Continue Prestiq 150 mg daily   2. Continue Wellbutrin 300 mg daily  4. Continue Abilify  5 mg at night  5. Return to clinic in two months for 30 mins 6. Referral to therapy  - on Xanax 0.125 mg daily as needed  - will ask more about her 22 year old  son at the next visit  The patient demonstrates the following risk factors for suicide: Chronic risk factors for suicide include: psychiatric disorder of depression, substance use disorder and previous self-harm of banging her head. Acute risk factors for suicide include: N/A. Protective factors for this patient include: positive social support and hope for the future. Considering these factors, the overall suicide risk at this point appears to be chronically elevated, but not at imminent risk. She is future oriented and is amenable to treatment plan. Patient is appropriate for outpatient follow up. Emergency resources which includes 911, ED, suicide crisis line [phone]) are discussed. She denies gun access at home  The duration of this  appointment visit was 30 minutes of face-to-face time with the patient.  Greater than 50% of this time was spent in counseling, explanation of  diagnosis, planning of further management, and coordination of care.  [name], MD 08/04/2018, 4:06 PM

## 2018-08-04 ENCOUNTER — Ambulatory Visit (HOSPITAL_COMMUNITY): Payer: Medicaid Other | Admitting: Psychiatry

## 2018-08-04 ENCOUNTER — Encounter (HOSPITAL_COMMUNITY): Payer: Self-pay | Admitting: Psychiatry

## 2018-08-04 VITALS — BP 135/85 | HR 101 | Ht 67.0 in | Wt 284.0 lb

## 2018-08-04 DIAGNOSIS — F331 Major depressive disorder, recurrent, moderate: Secondary | ICD-10-CM

## 2018-08-04 MED ORDER — ARIPIPRAZOLE 5 MG PO TABS: 5.0000 mg | ORAL_TABLET | Freq: Every day | ORAL | 0 refills | Status: DC

## 2018-08-04 MED ORDER — BUPROPION HCL ER (XL) 300 MG PO TB24: 300.0000 mg | ORAL_TABLET | Freq: Every day | ORAL | 0 refills | Status: DC

## 2018-08-04 NOTE — Patient Instructions (Addendum)
1. Continue Prestiq 150 mg daily   2. Continue Wellbutrin 300 mg daily  4. Continue Abilify  5 mg at night   5. Return to clinic in two months for 30 mins

## 2018-08-10 ENCOUNTER — Other Ambulatory Visit: Payer: Self-pay | Admitting: Family Medicine

## 2018-08-10 DIAGNOSIS — F32A Depression, unspecified: Secondary | ICD-10-CM

## 2018-08-10 DIAGNOSIS — F329 Major depressive disorder, single episode, unspecified: Secondary | ICD-10-CM

## 2018-08-10 DIAGNOSIS — F325 Major depressive disorder, single episode, in full remission: Secondary | ICD-10-CM

## 2018-08-10 DIAGNOSIS — F419 Anxiety disorder, unspecified: Secondary | ICD-10-CM

## 2018-08-14 ENCOUNTER — Other Ambulatory Visit: Payer: Self-pay | Admitting: Family Medicine

## 2018-08-14 NOTE — Telephone Encounter (Signed)
Needs to be seen for any further refills 

## 2018-08-24 ENCOUNTER — Ambulatory Visit: Payer: Medicaid Other | Admitting: Family Medicine

## 2018-08-24 ENCOUNTER — Encounter: Payer: Self-pay | Admitting: Family Medicine

## 2018-08-24 VITALS — BP 122/77 | HR 84 | Temp 97.3°F | Ht 67.0 in | Wt 285.6 lb

## 2018-08-24 DIAGNOSIS — E1169 Type 2 diabetes mellitus with other specified complication: Secondary | ICD-10-CM

## 2018-08-24 DIAGNOSIS — M1711 Unilateral primary osteoarthritis, right knee: Secondary | ICD-10-CM | POA: Diagnosis not present

## 2018-08-24 DIAGNOSIS — M1712 Unilateral primary osteoarthritis, left knee: Secondary | ICD-10-CM | POA: Diagnosis not present

## 2018-08-24 MED ORDER — ACETAMINOPHEN-CODEINE #3 300-30 MG PO TABS: 1.0000 | ORAL_TABLET | Freq: Three times a day (TID) | ORAL | 0 refills | Status: DC | PRN

## 2018-08-24 MED ORDER — METHYLPREDNISOLONE ACETATE 80 MG/ML IJ SUSP
80.0000 mg | Freq: Once | INTRAMUSCULAR | Status: AC
  Administered 2018-08-24: 80 mg via INTRAMUSCULAR

## 2018-08-24 NOTE — Progress Notes (Signed)
BP 122/77   Pulse 84   Temp (!) 97.3 F (36.3 C) (Oral)   Ht 5\' 7"  (1.702 m)   Wt 285 lb 9.6 oz (129.5 kg)   LMP 05/28/2017   BMI 44.73 kg/m    Subjective:    Patient ID: Heidi [name], female    DOB: 1991/04/08, 27 y.o.   MRN: [id number]  HPI: Heidi [name] is a 27 y.o. female presenting on 08/24/2018 for knee injections (bilateral)   HPI Type 2 diabetes mellitus Patient comes in today for recheck of his diabetes. Patient has been currently taking metformin, we will see where her A1c is last time it was very elevated and she started taking the metformin again. Patient is currently on an ACE inhibitor/ARB. Patient has not seen an ophthalmologist this year. Patient denies any issues with their feet.   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  She says usually the injections last close to the 4 months that we are doing them at home and she would like to continue with that for now.  She does have a rheumatological doctor but has not seen them recently and will go back to discuss other options with them  Relevant past medical, surgical, family and social history reviewed and updated as indicated. Interim medical history since our last visit reviewed. Allergies and medications reviewed and updated.  Review of Systems  Constitutional: Negative for chills and fever.  Eyes: Negative for redness and visual disturbance.  Respiratory: Negative for chest tightness and shortness of breath.   Cardiovascular: Negative for chest pain and leg swelling.  Musculoskeletal: Positive for arthralgias. Negative for back pain and gait problem.  Skin: Negative for rash.  Neurological: Negative for dizziness, light-headedness and headaches.  Psychiatric/Behavioral: Negative for agitation and behavioral problems.  All other systems  reviewed and are negative.   Per HPI unless specifically indicated above   Allergies as of 08/24/2018      Reactions   Hydroxyzine    Causes her more anxiety.   Linzess [linaclotide] Hives   Lorazepam    Sedation   Morphine And Related Other (See Comments)   Makes pt.aggressive   Quetiapine    Sweating, irritability      Medication List       Accurate as of August 24, 2018 11:57 AM. Always use your most recent med list.        acetaminophen-codeine 300-30 MG tablet Commonly known as:  TYLENOL #3 Take 1 tablet by mouth every 8 (eight) hours as needed for moderate pain or severe pain.   albuterol 108 (90 Base) MCG/ACT inhaler Commonly known as:  PROVENTIL HFA;VENTOLIN HFA Inhale 2 puffs into the lungs every 6 (six) hours as needed for wheezing or shortness of breath.   ALPRAZolam 0.25 MG tablet Commonly known as:  XANAX Take 0.5-1 tablets (0.125-0.25 mg total) by mouth 2 (two) times daily as needed for anxiety (depends on anxiety if takes 05-0 tablet).   amLODipine 5 MG tablet Commonly known as:  NORVASC TAKE 1 TABLET BY MOUTH ONCE DAILY   ARIPiprazole 5 MG tablet Commonly known as:  ABILIFY Take 1 tablet (5 mg total) by mouth daily.   buPROPion 300 MG 24 hr tablet Commonly known as:  WELLBUTRIN XL Take 1 tablet (300 mg total) by mouth daily.   cyclobenzaprine 10 MG tablet Commonly known as:  FLEXERIL TAKE 1 TABLET BY MOUTH THREE TIMES DAILY AS NEEDED FOR MUSCLE SPASM   DEXILANT 60 MG capsule Generic drug:  dexlansoprazole TAKE 1 CAPSULE BY MOUTH ONCE DAILY (ENDOSCOPIC FAILURE ON NEXIUM. POOR CONTROL OF SYMPTOMS ON PANTOPRAZOLE)   glucose blood test strip Commonly known as:  ACCU-CHEK GUIDE Use to check BG once daily at varying times of day.   Lancets 30G Misc Dispense Accu-Chek guide lancets.  Check blood sugar at varying times once per day.   lisinopril-hydrochlorothiazide 20-12.5 MG tablet Commonly known as:  ZESTORETIC Take 1 tablet by mouth  daily.   meloxicam 7.5 MG tablet Commonly known as:  MOBIC TAKE 2 TABLETS BY MOUTH ONCE DAILY   metFORMIN 1000 MG tablet Commonly known as:  GLUCOPHAGE TAKE 1 TABLET BY MOUTH TWICE DAILY WITH A MEAL   PRISTIQ 50 MG 24 hr tablet Generic drug:  desvenlafaxine TAKE 3 TABLETS BY MOUTH ONCE DAILY   traZODone 50 MG tablet Commonly known as:  DESYREL Take 1 tablet (50 mg total) by mouth at bedtime as needed for sleep.          Objective:    BP 122/77   Pulse 84   Temp (!) 97.3 F (36.3 C) (Oral)   Ht 5\' 7"  (1.702 m)   Wt 285 lb 9.6 oz (129.5 kg)   LMP 05/28/2017   BMI 44.73 kg/m   Wt Readings from Last 3 Encounters:  08/24/18 285 lb 9.6 oz (129.5 kg)  07/21/18 285 lb (129.3 kg)  05/11/18 297 lb 9.9 oz (135 kg)    Physical Exam Vitals signs and nursing note reviewed.  Constitutional:      General: She is not in acute distress.    Appearance: She is well-developed. She is not diaphoretic.  Eyes:     Conjunctiva/sclera: Conjunctivae normal.  Cardiovascular:     Rate and Rhythm: Normal rate and regular rhythm.     Heart sounds: Normal heart sounds. No murmur.  Pulmonary:     Effort: Pulmonary effort is normal. No respiratory distress.     Breath sounds: Normal breath sounds. No wheezing.  Musculoskeletal: Normal range of motion.     Right knee: She exhibits normal alignment, no LCL laxity, normal patellar mobility, normal meniscus and no MCL laxity. Tenderness found. Medial joint line tenderness noted.     Left knee: She exhibits normal alignment, no LCL laxity, normal patellar mobility, normal meniscus and no MCL laxity. Tenderness found. Medial joint line tenderness noted.  Skin:    General: Skin is warm and dry.     Findings: No rash.  Neurological:     Mental Status: She is alert and oriented to person, place, and time.     Coordination: Coordination normal.  Psychiatric:        Behavior: Behavior normal.    Knee injection bilateral: Consent form signed. Risk  factors of bleeding and infection discussed with patient and patient is agreeable towards injection. Patient prepped with Betadine. Lateral approach towards injection used. Injected 80 mg of Depo-Medrol and 1 mL of 2% lidocaine. Patient tolerated procedure well and no side effects from noted. Minimal to no bleeding. Simple bandage applied after.     Assessment & Plan:   Problem List Items Addressed This Visit      Endocrine   Type 2 diabetes mellitus (HCC) - Primary   Relevant Orders   Bayer DCA Hb A1c Waived    Other Visit Diagnoses    Osteoarthritis of right knee, unspecified osteoarthritis  type       Relevant Medications   methylPREDNISolone acetate (DEPO-MEDROL) injection 80 mg (Completed)   methylPREDNISolone acetate (DEPO-MEDROL) injection 80 mg (Completed)   acetaminophen-codeine (TYLENOL #3) 300-30 MG tablet   Osteoarthritis of left knee, unspecified osteoarthritis type       Relevant Medications   methylPREDNISolone acetate (DEPO-MEDROL) injection 80 mg (Completed)   methylPREDNISolone acetate (DEPO-MEDROL) injection 80 mg (Completed)   acetaminophen-codeine (TYLENOL #3) 300-30 MG tablet       Follow up plan: Return in about 4 months (around 12/24/2018), or if symptoms worsen or fail to improve, for Recheck diabetes and arthritis.  Counseling provided for all of the vaccine components Orders Placed This Encounter  Procedures  . [facility] Waived    Arville CareJoshua , MD [facility] 08/24/2018, 11:57 AM

## 2018-08-28 NOTE — Addendum Note (Signed)
Addended by: Prescott GumLAND, Criss M on: 08/28/2018 10:37 AM   Modules accepted: Orders

## 2018-09-15 ENCOUNTER — Ambulatory Visit: Payer: Medicaid Other | Admitting: Adult Health

## 2018-09-15 ENCOUNTER — Encounter: Payer: Self-pay | Admitting: Adult Health

## 2018-09-15 VITALS — BP 123/75 | HR 78 | Ht 67.0 in | Wt 284.2 lb

## 2018-09-15 DIAGNOSIS — B379 Candidiasis, unspecified: Secondary | ICD-10-CM

## 2018-09-15 DIAGNOSIS — Z113 Encounter for screening for infections with a predominantly sexual mode of transmission: Secondary | ICD-10-CM

## 2018-09-15 DIAGNOSIS — N898 Other specified noninflammatory disorders of vagina: Secondary | ICD-10-CM

## 2018-09-15 LAB — POCT WET PREP (WET MOUNT)
Clue Cells Wet Prep Whiff POC: NEGATIVE
WBC, Wet Prep HPF POC: POSITIVE

## 2018-09-15 MED ORDER — FLUCONAZOLE 150 MG PO TABS: ORAL_TABLET | ORAL | 1 refills | Status: DC

## 2018-09-15 NOTE — Progress Notes (Signed)
Patient ID: Heidi [name], female   DOB: 06-03-1991, 28 y.o.   MRN: [id number] History of Present Illness: Heidi [name] is a 28 year old white female in complaining of vaginal itching and requests STD testing. She had +chlamydia in July 21, 2018. She is sp [facility].  PCP is Dr Louanne Skye.   Current Medications, Allergies, Past Medical History, Past Surgical History, Family History and Social History were reviewed in [employer] record.     Review of Systems:  +vaginal itching for about 3 days Has had more sex lately   Physical Exam:BP 123/75 (BP Location: Left Arm, Patient Position: Sitting, Cuff Size: Large)   Pulse 78   Ht 5\' 7"  (1.702 m)   Wt 284 lb 3.2 oz (128.9 kg)   LMP 05/28/2017 Comment: [facility]  BMI 44.51 kg/m  General:  Well developed, well nourished, no acute distress Skin:  Warm and dry Pelvic:  External genitalia is normal in appearance, no lesions.  The vagina is normal in appearance.Thin white discharge without odor Urethra has no lesions or masses. The cervix is bulbous and smooth.  Uterus is absent.  No adnexal masses or tenderness noted.Bladder is non tender, no masses felt.Nuswab obtained. Wet prep:+WBC and yeast Psych:  No mood changes, alert and cooperative,seems happy Fall risk is low. Examination chaperoned by Federico Flake, CMA. She declines HIV and RPR, was negative 07/21/18.  Impression: 1. Yeast infection   2. Vaginal itching   3. Screening for STD (sexually transmitted disease)       Plan: Nuswab sent Meds ordered this encounter  Medications  . fluconazole (DIFLUCAN) 150 MG tablet    Sig: Take 1 now and 1 in 3 days    Dispense:  2 tablet    Refill:  1    Order Specific Question:   Supervising Provider    Answer:   Duane Lope H [[id number]]  F/U prn

## 2018-09-18 LAB — NUSWAB VAGINITIS PLUS (VG+)
Candida albicans, NAA: NEGATIVE
Candida glabrata, NAA: NEGATIVE
Chlamydia trachomatis, NAA: NEGATIVE
Neisseria gonorrhoeae, NAA: NEGATIVE
Trich vag by NAA: NEGATIVE

## 2018-09-21 ENCOUNTER — Other Ambulatory Visit: Payer: Self-pay | Admitting: Family Medicine

## 2018-10-07 ENCOUNTER — Ambulatory Visit (HOSPITAL_COMMUNITY): Payer: Medicaid Other | Admitting: Psychiatry

## 2018-10-12 ENCOUNTER — Ambulatory Visit: Payer: Medicaid Other | Admitting: Physician Assistant

## 2018-10-12 ENCOUNTER — Encounter: Payer: Self-pay | Admitting: Physician Assistant

## 2018-10-12 VITALS — BP 156/103 | HR 118 | Temp 98.8°F | Ht 67.0 in | Wt 281.0 lb

## 2018-10-12 DIAGNOSIS — J209 Acute bronchitis, unspecified: Secondary | ICD-10-CM | POA: Diagnosis not present

## 2018-10-12 DIAGNOSIS — R05 Cough: Secondary | ICD-10-CM

## 2018-10-12 DIAGNOSIS — J020 Streptococcal pharyngitis: Secondary | ICD-10-CM | POA: Diagnosis not present

## 2018-10-12 DIAGNOSIS — J111 Influenza due to unidentified influenza virus with other respiratory manifestations: Secondary | ICD-10-CM | POA: Diagnosis not present

## 2018-10-12 DIAGNOSIS — R059 Cough, unspecified: Secondary | ICD-10-CM

## 2018-10-12 LAB — VERITOR FLU A/B WAIVED
Influenza A: NEGATIVE
Influenza B: POSITIVE — AB

## 2018-10-12 LAB — RAPID STREP SCREEN (MED CTR MEBANE ONLY): Strep Gp A Ag, IA W/Reflex: POSITIVE — AB

## 2018-10-12 MED ORDER — AMOXICILLIN 500 MG PO CAPS: 500.0000 mg | ORAL_CAPSULE | Freq: Three times a day (TID) | ORAL | 0 refills | Status: DC

## 2018-10-12 MED ORDER — OSELTAMIVIR PHOSPHATE 75 MG PO CAPS: 75.0000 mg | ORAL_CAPSULE | Freq: Two times a day (BID) | ORAL | 0 refills | Status: DC

## 2018-10-13 ENCOUNTER — Encounter: Payer: Self-pay | Admitting: Physician Assistant

## 2018-10-13 ENCOUNTER — Other Ambulatory Visit: Payer: Self-pay | Admitting: Physician Assistant

## 2018-10-13 MED ORDER — BENZONATATE 200 MG PO CAPS: 200.0000 mg | ORAL_CAPSULE | Freq: Two times a day (BID) | ORAL | 0 refills | Status: DC | PRN

## 2018-10-13 MED ORDER — PREDNISONE 10 MG (21) PO TBPK: ORAL_TABLET | ORAL | 0 refills | Status: DC

## 2018-10-15 ENCOUNTER — Other Ambulatory Visit: Payer: Self-pay | Admitting: Family Medicine

## 2018-10-15 DIAGNOSIS — M545 Low back pain: Principal | ICD-10-CM

## 2018-10-15 DIAGNOSIS — G8929 Other chronic pain: Secondary | ICD-10-CM

## 2018-10-20 ENCOUNTER — Other Ambulatory Visit: Payer: Self-pay | Admitting: Family Medicine

## 2018-10-20 DIAGNOSIS — I10 Essential (primary) hypertension: Secondary | ICD-10-CM

## 2018-10-21 ENCOUNTER — Encounter: Payer: Self-pay | Admitting: Family Medicine

## 2018-10-25 ENCOUNTER — Encounter: Payer: Self-pay | Admitting: Family Medicine

## 2018-10-25 DIAGNOSIS — E1159 Type 2 diabetes mellitus with other circulatory complications: Secondary | ICD-10-CM

## 2018-10-25 DIAGNOSIS — I10 Essential (primary) hypertension: Principal | ICD-10-CM

## 2018-10-26 MED ORDER — LISINOPRIL-HYDROCHLOROTHIAZIDE 20-12.5 MG PO TABS: 1.0000 | ORAL_TABLET | Freq: Every day | ORAL | 0 refills | Status: DC

## 2018-10-28 ENCOUNTER — Ambulatory Visit (HOSPITAL_COMMUNITY): Payer: Medicaid Other | Admitting: Psychiatry

## 2018-11-03 NOTE — Progress Notes (Signed)
BH MD/PA/NP OP Progress Note  11/06/2018 9:25 AM Heidi [name]  MRN:  [id number]  Chief Complaint:  Chief Complaint    Depression; Follow-up     HPI:  The patient presents for follow-up appointment for depression.  She states that she has been feeling more depressed.  She broke up with other boyfriend, who is a friend of Heidi [name].  She started to date with him after Heidi [name] had infidelity.  She was open to [place] and she reports that Heidi [name] was fine with it.  She found this relationship to be toxic, and she ended it.  Although she does not regret it, she did not think it hurt so much.  She hopes to get married with Heidi [name] after living together for 10 years as that is what he see told the patient to do if he were to get married.  She states that Heidi [name] is a wonderful person and she wants to continue the relationship.  She is also concerned that she might lose Medicaid; she tries to cut down the work hours so that she can still have Medicaid.  She has insomnia.  She feels fatigue.  She has difficulty in concentration.  She has increased appetite.  She has passive SI, although she denies any attempt to plan.  She denies SIB.  She feels anxious and tense.  She has had panic attacks; she has not taken Xanax as it has limited benefit.  She drinks down to a few glasses of liquor.  She denies drinking in the morning or at work.  She uses marijuana a few times per week for anxiety.  She does not use it when she works or drive.    Wt Readings from Last 3 Encounters:  11/06/18 281 lb (127.5 kg)  10/12/18 281 lb (127.5 kg)  09/15/18 284 lb 3.2 oz (128.9 kg)    Visit Diagnosis: No diagnosis found.  Past Psychiatric History: Please see initial evaluation for full details. I have reviewed the history. No updates at this time.     Past Medical History:  Past Medical History:  Diagnosis Date  . Abdominal pain 01/10/2016  . Anxiety   . Back pain   . Bloating 01/10/2016  . Depression   . GERD (gastroesophageal  reflux disease)   . Hematuria 01/10/2016  . Herpes simplex virus (HSV) infection   . Hypertension   . Osteoarthritis   . Prediabetes   . Schizo-affective schizophrenia (HCC)   . Type 2 diabetes mellitus (HCC) 03/28/2016  . Urinary frequency 01/10/2016    Past Surgical History:  Procedure Laterality Date  . BILATERAL SALPINGECTOMY Bilateral 06/24/2017   Procedure: BILATERAL SALPINGECTOMY;  Surgeon: Tilda Burrow, MD;  Location: [facility];  Service: Gynecology;  Laterality: Bilateral;  . CESAREAN SECTION    . COLONOSCOPY WITH PROPOFOL N/A 03/17/2017   Procedure: COLONOSCOPY WITH PROPOFOL;  Surgeon: Corbin Ade, MD;  Location: [facility];  Service: Endoscopy;  Laterality: N/A;  9:30am  . DILATION AND CURETTAGE OF UTERUS    . ESOPHAGOGASTRODUODENOSCOPY (EGD) WITH PROPOFOL N/A 03/17/2017   Procedure: ESOPHAGOGASTRODUODENOSCOPY (EGD) WITH PROPOFOL;  Surgeon: Corbin Ade, MD;  Location: [facility];  Service: Endoscopy;  Laterality: N/A;  . ESSURE TUBAL LIGATION    . SUPRACERVICAL ABDOMINAL HYSTERECTOMY N/A 06/24/2017   Procedure: SUPRACERVICAL ABDOMINAL HYSTERECTOMY, WIDE EXCISION OF CICATRIX;  Surgeon: Tilda Burrow, MD;  Location: [facility];  Service: Gynecology;  Laterality: N/A;    Family Psychiatric History: Please see initial evaluation for full  details. I have reviewed the history. No updates at this time.     Family History:  Family History  Problem Relation Age of Onset  . Depression Mother   . Hypertension Mother   . Diabetes Mother   . Mental illness Father   . Hypertension Father   . Diabetes Father   . Anxiety disorder Father   . Cancer Maternal Aunt   . Diabetes Paternal Grandmother   . Hypertension Paternal Grandmother   . Arthritis Paternal Grandmother   . Stroke Paternal Grandfather   . Heart disease Paternal Grandfather   . Hypertension Paternal Grandfather   . Diabetes Paternal Grandfather   . Schizophrenia Maternal Grandfather   . Alcohol abuse  Maternal Grandfather   . Other Sister        recovering addict  . Schizophrenia Sister   . Irritable bowel syndrome Sister   . Drug abuse Sister   . Other Sister        recovering addict  . Bipolar disorder Sister   . Seizures Sister   . Drug abuse Sister   . Schizophrenia Sister   . Depression Paternal Aunt   . Anxiety disorder Paternal Aunt   . Colon cancer Neg Hx   . Inflammatory bowel disease Neg Hx   . Celiac disease Neg Hx     Social History:  Social History   Socioeconomic History  . Marital status: Legally Separated    Spouse name: Not on file  . Number of children: 1  . Years of education: HS  . Highest education level: Not on file  Occupational History  . Occupation: Unemployed  Social Needs  . Financial resource strain: Not on file  . Food insecurity:    Worry: Not on file    Inability: Not on file  . Transportation needs:    Medical: Not on file    Non-medical: Not on file  Tobacco Use  . Smoking status: Current Every Day Smoker    Packs/day: 1.00    Years: 10.00    Pack years: 10.00    Types: Cigarettes  . Smokeless tobacco: Never Used  Substance and Sexual Activity  . Alcohol use: Yes    Alcohol/week: 0.0 standard drinks    Comment: Occasionally 7  . Drug use: Yes    Frequency: 4.0 times per week    Types: Marijuana    Comment: Occasionally  . Sexual activity: Yes    Partners: Male    Birth control/protection: Surgical    Comment: tubal sch  Lifestyle  . Physical activity:    Days per week: Not on file    Minutes per session: Not on file  . Stress: Not on file  Relationships  . Social connections:    Talks on phone: Not on file    Gets together: Not on file    Attends religious service: Not on file    Active member of club or organization: Not on file    Attends meetings of clubs or organizations: Not on file    Relationship status: Not on file  Other Topics Concern  . Not on file  Social History Narrative   Lives at home with her  boyfriend.   Right-handed.   Drinks approximately two 2-liter sodas per day.   Drinks 1 cup coffee per day.    Allergies:  Allergies  Allergen Reactions  . Hydroxyzine     Causes her more anxiety.  [name] [Linaclotide] Hives  . Lorazepam  Sedation  . Morphine And Related Other (See Comments)    Makes pt.aggressive  . Quetiapine     Sweating, irritability    Metabolic Disorder Labs: Lab Results  Component Value Date   HGBA1C 9.8 (H) 04/23/2018   MPG 220.21 06/20/2017   No results found for: PROLACTIN Lab Results  Component Value Date   CHOL 149 03/13/2018   TRIG 287 (H) 03/13/2018   HDL 26 (L) 03/13/2018   CHOLHDL 5.7 (H) 03/13/2018   LDLCALC 66 03/13/2018   LDLCALC 68 08/29/2017   Lab Results  Component Value Date   TSH 0.890 04/30/2018   TSH 1.930 09/26/2015    Therapeutic Level Labs: No results found for: LITHIUM No results found for: VALPROATE No components found for:  CBMZ  Current Medications: Current Outpatient Medications  Medication Sig Dispense Refill  . acetaminophen-codeine (TYLENOL #3) 300-30 MG tablet Take 1 tablet by mouth every 8 (eight) hours as needed for moderate pain or severe pain. 10 tablet 0  . albuterol (PROVENTIL HFA;VENTOLIN HFA) 108 (90 Base) MCG/ACT inhaler Inhale 2 puffs into the lungs every 6 (six) hours as needed for wheezing or shortness of breath. 1 Inhaler 2  . ALPRAZolam (XANAX) 0.25 MG tablet Take 0.5-1 tablets (0.125-0.25 mg total) by mouth 2 (two) times daily as needed for anxiety (depends on anxiety if takes 05-0 tablet). 15 tablet 2  . amLODipine (NORVASC) 5 MG tablet TAKE 1 TABLET BY MOUTH ONCE DAILY 90 tablet 0  . ARIPiprazole (ABILIFY) 5 MG tablet Take 1 tablet (5 mg total) by mouth daily. 90 tablet 0  . buPROPion (WELLBUTRIN XL) 300 MG 24 hr tablet Take 1 tablet (300 mg total) by mouth daily. 90 tablet 0  . cyclobenzaprine (FLEXERIL) 10 MG tablet TAKE 1 TABLET BY MOUTH THREE TIMES DAILY AS NEEDED FOR MUSCLE SPASM  30 tablet 2  . DEXILANT 60 MG capsule TAKE 1 CAPSULE BY MOUTH ONCE DAILY (ENDOSCOPIC FAILURE ON NEXIUM. POOR CONTROL OF SYMPTOMS ON PANTOPRAZOLE) (Patient taking differently: Take 60 mg by mouth every evening. ) 30 capsule 11  . lisinopril-hydrochlorothiazide (ZESTORETIC) 20-12.5 MG tablet Take 1 tablet by mouth daily. 90 tablet 0  . meloxicam (MOBIC) 7.5 MG tablet TAKE 2 TABLETS BY MOUTH ONCE DAILY 60 tablet 2  . metFORMIN (GLUCOPHAGE) 1000 MG tablet TAKE 1 TABLET BY MOUTH TWICE DAILY WITH A MEAL 180 tablet 0  . traZODone (DESYREL) 50 MG tablet Take 1 tablet (50 mg total) by mouth at bedtime as needed for sleep. 30 tablet 2  . benzonatate (TESSALON) 200 MG capsule Take 1 capsule (200 mg total) by mouth 2 (two) times daily as needed for cough. (Patient not taking: Reported on 11/06/2018) 20 capsule 0  . fluconazole (DIFLUCAN) 150 MG tablet Take 1 now and 1 in 3 days (Patient not taking: Reported on 11/06/2018) 2 tablet 1  . glucose blood (ACCU-CHEK GUIDE) test strip Use to check BG once daily at varying times of day. (Patient not taking: Reported on 11/06/2018) 100 each 3  . Lancets 30G MISC Dispense Accu-Chek guide lancets.  Check blood sugar at varying times once per day. (Patient not taking: Reported on 11/06/2018) 100 each 11   No current facility-administered medications for this visit.      Musculoskeletal: Strength & Muscle Tone: within normal limits Gait & Station: normal Patient leans: N/A  Psychiatric Specialty Exam: Review of Systems  Psychiatric/Behavioral: Positive for depression and suicidal ideas. Negative for hallucinations, memory loss and substance abuse. The patient is nervous/anxious  and has insomnia.   All other systems reviewed and are negative.   Blood pressure 131/86, pulse 82, height 5\' 7"  (1.702 m), weight 281 lb (127.5 kg), last menstrual period 05/28/2017.Body mass index is 44.01 kg/m.  General Appearance: Fairly Groomed  Eye Contact:  Good  Speech:  Clear and  Coherent  Volume:  Normal  Mood:  Depressed  Affect:  Appropriate, Congruent, Tearful and down  Thought Process:  Coherent  Orientation:  Full (Time, Place, and Person)  Thought Content: Logical   Suicidal Thoughts:  Yes.  without intent/plan  Homicidal Thoughts:  No  Memory:  Immediate;   Good  Judgement:  Good  Insight:  Present  Psychomotor Activity:  Normal  Concentration:  Concentration: Good and Attention Span: Good  Recall:  Good  Fund of Knowledge: Good  Language: Good  Akathisia:  No  Handed:  Right  AIMS (if indicated): not done  Assets:  Communication Skills Desire for Improvement  ADL's:  Intact  Cognition: WNL  Sleep:  Poor   Screenings: GAD-7     Virtual BH Phone Follow Up from 10/15/2017 in [facility]  Total GAD-7 Score  0    PHQ2-9     Office Visit from 10/12/2018 in [facility] Office Visit from 08/24/2018 in [facility] Office Visit from 04/23/2018 in [facility] Office Visit from 04/15/2018 in [facility] Office Visit from 03/13/2018 in [facility]  PHQ-2 Total Score  2  2  2  2  2   PHQ-9 Total Score  9  9  6  9  9        Assessment and Plan:  Delesha Salvetti is a 28 y.o. year old female with a history of depression, type II diabetes,  hypertension, status post supracervical hysterectomywith bilateral salpingectomy, who presents for follow up appointment for No diagnosis found.  # MDD, moderate, recurrent without psychotic features # r/o PTSD Patient reports worsening in depressive symptoms in the context of break-up with her other boyfriend, and the concern of losing Medicaid.  She also has feeling of abandonment from her prior relationships and her mother of the child.  Will uptitrate Pristiq to target depression.  Will continue bupropion to target depression.  Discussed potential risk of headache and worsening in anxiety.   She has no known history of seizure.  Will continue Abilify as adjunctive treatment for depression.  Discussed risk of EPS and metabolic side effect.  She does have cluster B traits, and will greatly benefit from CBT/DBT.  Will make referral.   # Alcohol use # Marijuana use Although she has limited insight into alcohol and marijuana use, she has been trying to cut down its use.  Will continue motivational interview.   Plan  I have reviewed and updated plans as below 1. Increase Pristiq 200 mg daily  2. Continue bupropion 300 mg daily  3. Continue Abilify 5 mg at night  4.Return to clinic in two months for 30 mins 5. Referral to therapy  - on Xanax 0.125 mg daily as needed   I have reviewed suicide assessment in detail. No change in the following assessment.   The patient demonstrates the following risk factors for suicide: Chronic risk factors for suicide include:psychiatric disorder ofdepression, substance use disorder and previous self-harmof banging her head. Acute risk factorsfor suicide include: N/A. Protective factorsfor this patient include: positive social support and hope for the future. Considering these factors, the overall  suicide risk at this point appears to bechronically elevated, but not at imminent risk. She is future oriented and is amenable to treatment plan.Patient isappropriate for outpatient follow up. Emergency resources which includes 911, ED, suicide crisis line [phone]) are discussed.She denies gun access at home  The duration of this appointment visit was 25 minutes of face-to-face time with the patient.  Greater than 50% of this time was spent in counseling, explanation of  diagnosis, planning of further management, and coordination of care.  Heidi Hotter, MD 11/06/2018, 9:25 AM

## 2018-11-04 ENCOUNTER — Other Ambulatory Visit: Payer: Self-pay | Admitting: Family Medicine

## 2018-11-04 DIAGNOSIS — F329 Major depressive disorder, single episode, unspecified: Secondary | ICD-10-CM

## 2018-11-04 DIAGNOSIS — F419 Anxiety disorder, unspecified: Secondary | ICD-10-CM

## 2018-11-04 DIAGNOSIS — F325 Major depressive disorder, single episode, in full remission: Secondary | ICD-10-CM

## 2018-11-04 DIAGNOSIS — F32A Depression, unspecified: Secondary | ICD-10-CM

## 2018-11-06 ENCOUNTER — Ambulatory Visit (INDEPENDENT_AMBULATORY_CARE_PROVIDER_SITE_OTHER): Payer: Medicaid Other | Admitting: Psychiatry

## 2018-11-06 ENCOUNTER — Encounter (HOSPITAL_COMMUNITY): Payer: Self-pay | Admitting: Psychiatry

## 2018-11-06 VITALS — BP 131/86 | HR 82 | Ht 67.0 in | Wt 281.0 lb

## 2018-11-06 DIAGNOSIS — F331 Major depressive disorder, recurrent, moderate: Secondary | ICD-10-CM

## 2018-11-06 MED ORDER — DESVENLAFAXINE SUCCINATE ER 100 MG PO TB24: 200.0000 mg | ORAL_TABLET | Freq: Every day | ORAL | 1 refills | Status: DC

## 2018-11-06 NOTE — Patient Instructions (Addendum)
1. Increase Prestiq 200 mg daily  2. Continue bupropion 300 mg daily  3. Continue Abilify 5 mg at night  4.Return to clinic in two months for 30 mins 5. Referral to therapy

## 2018-11-18 ENCOUNTER — Telehealth: Payer: Self-pay | Admitting: Family

## 2018-11-18 DIAGNOSIS — R6889 Other general symptoms and signs: Secondary | ICD-10-CM

## 2018-11-18 NOTE — Progress Notes (Signed)
E-Visit for Corona Virus Screening Based on your current symptoms, it seems like that your symptoms could be related to the [place] virus.   Very important that you self isolate yourself for the next 14 days and avoid all contact with anyone over the age of 51 years or older!!!   Lots of rest, force fluids, and tylenol or motrin as needed.   I called patient and discussed SOB. This is stable and she gets increase SOB with activity and coughing.   Approximately 5 minutes was spent documenting and reviewing patient's chart.    Coronavirus disease 2019 (COVID-19) is a respiratory illness that can spread from person to person. The virus that causes COVID-19 is a new virus that was first identified in the country of [place] but is now found in multiple other countries and has spread to the [place].  Symptoms associated with the virus are mild to severe fever, cough, and shortness of breath. There is currently no vaccine to protect against COVID-19, and there is no specific antiviral treatment for the virus.  It is vitally important that if you feel that you have an infection such as this virus or any other virus that you stay home and away from places where you may spread it to others.  Currently, not all patients are being tested. If the symptoms are mild and there is not a known exposure, performing the test is not indicated.   Reduce your risk of any infection by using the same precautions used for avoiding the common cold or flu:  Marland Kitchen Wash your hands often with soap and warm water for at least 20 seconds.  If soap and water are not readily available, use an alcohol-based hand sanitizer with at least 60% alcohol.  . If coughing or sneezing, cover your mouth and nose by coughing or sneezing into the elbow areas of your shirt or coat, into a tissue or into your sleeve (not your hands). . Avoid shaking hands with others and consider head nods or verbal greetings only. . Avoid touching your eyes,  nose, or mouth with unwashed hands.  . Avoid close contact with people who are sick. . Avoid places or events with large numbers of people in one location, like concerts or sporting events. . Carefully consider travel plans you have or are making. . If you are planning any travel outside or inside the [place], visit the CDC's Travelers' Health webpage for the latest health notices. . If you have some symptoms but not all symptoms, continue to monitor at home and seek medical attention if your symptoms worsen. . If you are having a medical emergency, call 911.  HOME CARE . Only take medications as instructed by your medical team. . Drink plenty of fluids and get plenty of rest. . A steam or ultrasonic humidifier can help if you have congestion.   GET HELP RIGHT AWAY IF: . You develop worsening fever. . You become short of breath . You cough up blood. . Your symptoms become more severe MAKE SURE YOU   Understand these instructions.  Will watch your condition.  Will get help right away if you are not doing well or get worse.  Your e-visit answers were reviewed by a board certified advanced clinical practitioner to complete your personal care plan.  Depending on the condition, your plan could have included both over the counter or prescription medications.  If there is a problem please reply once you have received a response from your provider.  Your safety is important to [place].  If you have drug allergies check your prescription carefully.    You can use MyChart to ask questions about today's visit, request a non-urgent call back, or ask for a work or school excuse for 24 hours related to this e-Visit. If it has been greater than 24 hours you will need to follow up with your provider, or enter a new e-Visit to address those concerns. You will get an e-mail in the next two days asking about your experience.  I hope that your e-visit has been valuable and will speed your recovery. Thank you for  using e-visits.

## 2018-11-26 ENCOUNTER — Encounter: Payer: Self-pay | Admitting: Family Medicine

## 2018-11-30 ENCOUNTER — Other Ambulatory Visit (HOSPITAL_COMMUNITY): Payer: Self-pay | Admitting: Psychiatry

## 2018-11-30 MED ORDER — BUPROPION HCL ER (XL) 300 MG PO TB24: 300.0000 mg | ORAL_TABLET | Freq: Every day | ORAL | 0 refills | Status: DC

## 2018-11-30 MED ORDER — DESVENLAFAXINE SUCCINATE ER 100 MG PO TB24: 200.0000 mg | ORAL_TABLET | Freq: Every day | ORAL | 0 refills | Status: DC

## 2018-11-30 MED ORDER — ARIPIPRAZOLE 5 MG PO TABS: 5.0000 mg | ORAL_TABLET | Freq: Every day | ORAL | 0 refills | Status: DC

## 2018-12-29 ENCOUNTER — Other Ambulatory Visit: Payer: Self-pay | Admitting: *Deleted

## 2018-12-29 ENCOUNTER — Encounter: Payer: Self-pay | Admitting: Family Medicine

## 2018-12-29 DIAGNOSIS — E1159 Type 2 diabetes mellitus with other circulatory complications: Secondary | ICD-10-CM

## 2018-12-29 DIAGNOSIS — I10 Essential (primary) hypertension: Principal | ICD-10-CM

## 2018-12-29 MED ORDER — LISINOPRIL-HYDROCHLOROTHIAZIDE 20-12.5 MG PO TABS: 1.0000 | ORAL_TABLET | Freq: Every day | ORAL | 0 refills | Status: DC

## 2019-01-01 ENCOUNTER — Other Ambulatory Visit: Payer: Self-pay | Admitting: Family Medicine

## 2019-01-04 NOTE — Progress Notes (Signed)
Virtual Visit via Video Note  I connected with Heidi [name] on 01/06/19 at 10:00 AM EDT by a video enabled telemedicine application and verified that I am speaking with the correct person using two identifiers.   I discussed the limitations of evaluation and management by telemedicine and the availability of in person appointments. The patient expressed understanding and agreed to proceed.   I discussed the assessment and treatment plan with the patient. The patient was provided an opportunity to ask questions and all were answered. The patient agreed with the plan and demonstrated an understanding of the instructions.   The patient was advised to call back or seek an in-person evaluation if the symptoms worsen or if the condition fails to improve as anticipated.  I provided 15 minutes of non-face-to-face time during this encounter.   Heidi Hottereina Alasha Mcguinness, MD    [facility] Note  01/06/2019 10:35 AM Heidi MyrtleJessica [name]  MRN:  [id number]  Chief Complaint:  Chief Complaint    Depression; Follow-up     HPI:  This is a follow-up visit for depression.  She states that she has not been feeling great, although she is managing well.  She states that her oldest sister deceased a few weeks ago by taking pain pills.  She states that her 2 sisters has issues with drug abuse, although one is in remission.  She initially felt angry, although she does not feel that way as she cannot get angry at anybody except the one who gave drug to her sister.  She has disbelief.  She feels sad.  She has been visiting her mother a few times a week.  Although they do not talk about the loss, they will talk about her sister.  She has fair relationship with her mother.  She also reports good relationship with Heidi [name], who has been supportive.  She has insomnia.  She has decreased increased appetite.  She has fair concentration.  Has anhedonia.  She denies SI.  She occasionally feels anxious.  She uses marijuana every day so  that she can feel relaxed.  She denies panic attacks.  She has not drank lately.  She noticed some help from up titration of Pristiq, although it is not enough given the current condition of grief.    Visit Diagnosis:    ICD-10-CM   1. MDD (major depressive disorder), recurrent episode, moderate (HCC) F33.1     Past Psychiatric History: Please see initial evaluation for full details. I have reviewed the history. No updates at this time.     Past Medical History:  Past Medical History:  Diagnosis Date  . Abdominal pain 01/10/2016  . Anxiety   . Back pain   . Bloating 01/10/2016  . Depression   . GERD (gastroesophageal reflux disease)   . Hematuria 01/10/2016  . Herpes simplex virus (HSV) infection   . Hypertension   . Osteoarthritis   . Prediabetes   . Schizo-affective schizophrenia (HCC)   . Type 2 diabetes mellitus (HCC) 03/28/2016  . Urinary frequency 01/10/2016    Past Surgical History:  Procedure Laterality Date  . BILATERAL SALPINGECTOMY Bilateral 06/24/2017   Procedure: BILATERAL SALPINGECTOMY;  Surgeon: Tilda BurrowFerguson, John V, MD;  Location: [facility];  Service: Gynecology;  Laterality: Bilateral;  . CESAREAN SECTION    . COLONOSCOPY WITH PROPOFOL N/A 03/17/2017   Procedure: COLONOSCOPY WITH PROPOFOL;  Surgeon: Corbin Adeourk, Robert M, MD;  Location: [facility];  Service: Endoscopy;  Laterality: N/A;  9:30am  . DILATION AND CURETTAGE  OF UTERUS    . ESOPHAGOGASTRODUODENOSCOPY (EGD) WITH PROPOFOL N/A 03/17/2017   Procedure: ESOPHAGOGASTRODUODENOSCOPY (EGD) WITH PROPOFOL;  Surgeon: Corbin Ade, MD;  Location: [facility];  Service: Endoscopy;  Laterality: N/A;  . ESSURE TUBAL LIGATION    . SUPRACERVICAL ABDOMINAL HYSTERECTOMY N/A 06/24/2017   Procedure: SUPRACERVICAL ABDOMINAL HYSTERECTOMY, WIDE EXCISION OF CICATRIX;  Surgeon: Tilda Burrow, MD;  Location: [facility];  Service: Gynecology;  Laterality: N/A;    Family Psychiatric History: Please see initial evaluation for full  details. I have reviewed the history. No updates at this time.     Family History:  Family History  Problem Relation Age of Onset  . Depression Mother   . Hypertension Mother   . Diabetes Mother   . Mental illness Father   . Hypertension Father   . Diabetes Father   . Anxiety disorder Father   . Cancer Maternal Aunt   . Diabetes Paternal Grandmother   . Hypertension Paternal Grandmother   . Arthritis Paternal Grandmother   . Stroke Paternal Grandfather   . Heart disease Paternal Grandfather   . Hypertension Paternal Grandfather   . Diabetes Paternal Grandfather   . Schizophrenia Maternal Grandfather   . Alcohol abuse Maternal Grandfather   . Other Sister        recovering addict  . Schizophrenia Sister   . Irritable bowel syndrome Sister   . Drug abuse Sister   . Other Sister        recovering addict  . Bipolar disorder Sister   . Seizures Sister   . Drug abuse Sister   . Schizophrenia Sister   . Depression Paternal Aunt   . Anxiety disorder Paternal Aunt   . Colon cancer Neg Hx   . Inflammatory bowel disease Neg Hx   . Celiac disease Neg Hx     Social History:  Social History   Socioeconomic History  . Marital status: Legally Separated    Spouse name: Not on file  . Number of children: 1  . Years of education: HS  . Highest education level: Not on file  Occupational History  . Occupation: Unemployed  Social Needs  . Financial resource strain: Not on file  . Food insecurity:    Worry: Not on file    Inability: Not on file  . Transportation needs:    Medical: Not on file    Non-medical: Not on file  Tobacco Use  . Smoking status: Current Every Day Smoker    Packs/day: 1.00    Years: 10.00    Pack years: 10.00    Types: Cigarettes  . Smokeless tobacco: Never Used  Substance and Sexual Activity  . Alcohol use: Yes    Alcohol/week: 0.0 standard drinks    Comment: Occasionally 7  . Drug use: Yes    Frequency: 4.0 times per week    Types: Marijuana     Comment: Occasionally  . Sexual activity: Yes    Partners: Male    Birth control/protection: Surgical    Comment: tubal sch  Lifestyle  . Physical activity:    Days per week: Not on file    Minutes per session: Not on file  . Stress: Not on file  Relationships  . Social connections:    Talks on phone: Not on file    Gets together: Not on file    Attends religious service: Not on file    Active member of club or organization: Not on file    Attends  meetings of clubs or organizations: Not on file    Relationship status: Not on file  Other Topics Concern  . Not on file  Social History Narrative   Lives at home with her boyfriend.   Right-handed.   Drinks approximately two 2-liter sodas per day.   Drinks 1 cup coffee per day.    Allergies:  Allergies  Allergen Reactions  . Hydroxyzine     Causes her more anxiety.  [name] [Linaclotide] Hives  . Lorazepam     Sedation  . Morphine And Related Other (See Comments)    Makes pt.aggressive  . Quetiapine     Sweating, irritability    Metabolic Disorder Labs: Lab Results  Component Value Date   HGBA1C 9.8 (H) 04/23/2018   MPG 220.21 06/20/2017   No results found for: PROLACTIN Lab Results  Component Value Date   CHOL 149 03/13/2018   TRIG 287 (H) 03/13/2018   HDL 26 (L) 03/13/2018   CHOLHDL 5.7 (H) 03/13/2018   LDLCALC 66 03/13/2018   LDLCALC 68 08/29/2017   Lab Results  Component Value Date   TSH 0.890 04/30/2018   TSH 1.930 09/26/2015    Therapeutic Level Labs: No results found for: LITHIUM No results found for: VALPROATE No components found for:  CBMZ  Current Medications: Current Outpatient Medications  Medication Sig Dispense Refill  . acetaminophen-codeine (TYLENOL #3) 300-30 MG tablet Take 1 tablet by mouth every 8 (eight) hours as needed for moderate pain or severe pain. 10 tablet 0  . albuterol (PROVENTIL HFA;VENTOLIN HFA) 108 (90 Base) MCG/ACT inhaler Inhale 2 puffs into the lungs every 6  (six) hours as needed for wheezing or shortness of breath. 1 Inhaler 2  . ALPRAZolam (XANAX) 0.25 MG tablet Take 0.5-1 tablets (0.125-0.25 mg total) by mouth 2 (two) times daily as needed for anxiety (depends on anxiety if takes 05-0 tablet). 15 tablet 2  . amLODipine (NORVASC) 5 MG tablet TAKE 1 TABLET BY MOUTH ONCE DAILY 90 tablet 0  . ARIPiprazole (ABILIFY) 5 MG tablet Take 1 tablet (5 mg total) by mouth daily. 90 tablet 0  . benzonatate (TESSALON) 200 MG capsule Take 1 capsule (200 mg total) by mouth 2 (two) times daily as needed for cough. (Patient not taking: Reported on 11/06/2018) 20 capsule 0  . buPROPion (WELLBUTRIN XL) 150 MG 24 hr tablet 450 mg daily (300 mg + 150 mg) 30 tablet 1  . buPROPion (WELLBUTRIN XL) 300 MG 24 hr tablet Take 1 tablet (300 mg total) by mouth daily. 90 tablet 0  . cyclobenzaprine (FLEXERIL) 10 MG tablet TAKE 1 TABLET BY MOUTH THREE TIMES DAILY AS NEEDED FOR MUSCLE SPASM 30 tablet 2  . desvenlafaxine (PRISTIQ) 100 MG 24 hr tablet Take 2 tablets (200 mg total) by mouth daily. 180 tablet 0  . DEXILANT 60 MG capsule TAKE 1 CAPSULE BY MOUTH ONCE DAILY (ENDOSCOPIC FAILURE ON NEXIUM. POOR CONTROL OF SYMPTOMS ON PANTOPRAZOLE) (Patient taking differently: Take 60 mg by mouth every evening. ) 30 capsule 11  . fluconazole (DIFLUCAN) 150 MG tablet Take 1 now and 1 in 3 days (Patient not taking: Reported on 11/06/2018) 2 tablet 1  . glucose blood (ACCU-CHEK GUIDE) test strip Use to check BG once daily at varying times of day. (Patient not taking: Reported on 11/06/2018) 100 each 3  . Lancets 30G MISC Dispense Accu-Chek guide lancets.  Check blood sugar at varying times once per day. (Patient not taking: Reported on 11/06/2018) 100 each 11  .  lisinopril-hydrochlorothiazide (ZESTORETIC) 20-12.5 MG tablet Take 1 tablet by mouth daily. 90 tablet 0  . meloxicam (MOBIC) 7.5 MG tablet TAKE 2 TABLETS BY MOUTH ONCE DAILY 60 tablet 2  . metFORMIN (GLUCOPHAGE) 1000 MG tablet Take 1 tablet (1,000 mg  total) by mouth 2 (two) times daily with a meal. (Please make you 4 mos ckup) 60 tablet 0  . traZODone (DESYREL) 50 MG tablet Take 1 tablet (50 mg total) by mouth at bedtime as needed for sleep. 30 tablet 2   No current facility-administered medications for this visit.      Musculoskeletal: Strength & Muscle Tone: N/A Gait & Station: N/A Patient leans: N/A  Psychiatric Specialty Exam: Review of Systems  Psychiatric/Behavioral: Positive for depression. Negative for hallucinations, memory loss, substance abuse and suicidal ideas. The patient is nervous/anxious and has insomnia.   All other systems reviewed and are negative.   Last menstrual period 05/28/2017.There is no height or weight on file to calculate BMI.  General Appearance: Fairly Groomed  Eye Contact:  Good  Speech:  Clear and Coherent  Volume:  Normal  Mood:  Depressed  Affect:  Appropriate, Congruent, Restricted and Tearful  Thought Process:  Coherent  Orientation:  Full (Time, Place, and Person)  Thought Content: Logical   Suicidal Thoughts:  No  Homicidal Thoughts:  No  Memory:  Immediate;   Good  Judgement:  Good  Insight:  Fair  Psychomotor Activity:  Normal  Concentration:  Concentration: Good and Attention Span: Good  Recall:  Good  Fund of Knowledge: Good  Language: Good  Akathisia:  No  Handed:  Right  AIMS (if indicated): not done  Assets:  Communication Skills Desire for Improvement  ADL's:  Intact  Cognition: WNL  Sleep:  Poor   Screenings: GAD-7     Virtual BH Phone Follow Up from 10/15/2017 in [facility]  Total GAD-7 Score  0    PHQ2-9     Office Visit from 10/12/2018 in [facility] Office Visit from 08/24/2018 in [facility] Office Visit from 04/23/2018 in [facility] Office Visit from 04/15/2018 in [facility] Office Visit from 03/13/2018 in [facility]   PHQ-2 Total Score  PHQ-9 Total Score  Assessment and Plan:  Zhara Gieske is a 28 y.o. year old female with a history of depression, type II diabetes,  hypertension, status post supracervical hysterectomywith bilateral salpingectomy , who presents for follow up appointment for MDD (major depressive disorder), recurrent episode, moderate (HCC)  # MDD, moderate, recurrent without psychotic features # r/o PTSD Patient reports slight worsening in depressive symptoms in the context of loss of her sister from overdose.  Other psychosocial stressors includes recent break-up with her other boyfriend.  She also does have a feeling of abandonment from prior relationships and her mother.  Will uptitrate bupropion to target depression.  Discussed potential risk of headache and worsening in anxiety.  She has no known history of seizure.  Will continue Pristiq to target depression.  Will continue Abilify as adjunctive treatment for depression.  Discussed risk of EPS and metabolic side effect.  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  Referral was made.   # Alcohol use  # Marijuana use She is at pre-contemplative stage for marijuana  use.  Will continue motivational interview.   Plan  I have reviewed and updated plans as below 1. Continue Pristiq 200 mg daily  2.Increasebupropion 450 mg daily  3. ContinueAbilify 5 mg at night  4.  Next appointment 6/15 at 4:10 for 20 mins, video - Referred to therapy - on Xanax 0.125 mg daily as needed   The patient demonstrates the following risk factors for suicide: Chronic risk factors for suicide include:psychiatric disorder ofdepression, substance use disorder and previous self-harmof banging her head. Acute risk factorsfor suicide include: N/A. Protective factorsfor this patient include: positive social support and hope for the future. Considering these factors, the overall  suicide risk at this point appears to bechronically elevated, but not at imminent risk. She is future oriented and is amenable to treatment plan.Patient isappropriate for outpatient follow up. Emergency resources which includes 911, ED, suicide crisis line [phone]) are discussed.She denies gun access at home  Heidi Hotter, MD 01/06/2019, 10:35 AM

## 2019-01-06 ENCOUNTER — Ambulatory Visit (INDEPENDENT_AMBULATORY_CARE_PROVIDER_SITE_OTHER): Payer: Medicaid Other | Admitting: Psychiatry

## 2019-01-06 ENCOUNTER — Other Ambulatory Visit: Payer: Self-pay

## 2019-01-06 ENCOUNTER — Encounter (HOSPITAL_COMMUNITY): Payer: Self-pay | Admitting: Psychiatry

## 2019-01-06 DIAGNOSIS — F331 Major depressive disorder, recurrent, moderate: Secondary | ICD-10-CM | POA: Diagnosis not present

## 2019-01-06 MED ORDER — DESVENLAFAXINE SUCCINATE ER 100 MG PO TB24: 200.0000 mg | ORAL_TABLET | Freq: Every day | ORAL | 0 refills | Status: DC

## 2019-01-06 MED ORDER — BUPROPION HCL ER (XL) 150 MG PO TB24: ORAL_TABLET | ORAL | 1 refills | Status: DC

## 2019-01-07 ENCOUNTER — Other Ambulatory Visit: Payer: Self-pay

## 2019-01-08 ENCOUNTER — Ambulatory Visit: Payer: Medicaid Other | Admitting: Family Medicine

## 2019-01-08 ENCOUNTER — Other Ambulatory Visit: Payer: Self-pay

## 2019-01-08 ENCOUNTER — Encounter: Payer: Self-pay | Admitting: Family Medicine

## 2019-01-08 VITALS — BP 141/84 | HR 93 | Temp 97.0°F | Ht 67.0 in | Wt 284.6 lb

## 2019-01-08 DIAGNOSIS — K2100 Gastro-esophageal reflux disease with esophagitis, without bleeding: Secondary | ICD-10-CM

## 2019-01-08 DIAGNOSIS — E1169 Type 2 diabetes mellitus with other specified complication: Secondary | ICD-10-CM

## 2019-01-08 DIAGNOSIS — F331 Major depressive disorder, recurrent, moderate: Secondary | ICD-10-CM

## 2019-01-08 DIAGNOSIS — E1159 Type 2 diabetes mellitus with other circulatory complications: Secondary | ICD-10-CM

## 2019-01-08 DIAGNOSIS — M1712 Unilateral primary osteoarthritis, left knee: Secondary | ICD-10-CM

## 2019-01-08 DIAGNOSIS — M1711 Unilateral primary osteoarthritis, right knee: Secondary | ICD-10-CM

## 2019-01-08 DIAGNOSIS — I10 Essential (primary) hypertension: Secondary | ICD-10-CM

## 2019-01-08 LAB — BAYER DCA HB A1C WAIVED: HB A1C (BAYER DCA - WAIVED): 6.3 % (ref <7.0)

## 2019-01-08 MED ORDER — METHYLPREDNISOLONE ACETATE 80 MG/ML IJ SUSP
80.0000 mg | Freq: Once | INTRAMUSCULAR | Status: AC
  Administered 2019-01-08: 15:00:00 80 mg via INTRAMUSCULAR

## 2019-01-08 NOTE — Progress Notes (Signed)
BP (!) 141/84   Pulse 93   Temp (!) 97 F (36.1 C) (Oral)   Ht '5\' 7"'  (1.702 m)   Wt 284 lb 9.6 oz (129.1 kg)   LMP 05/28/2017 Comment: [place]  BMI 44.57 kg/m    Subjective:   Patient ID: Heidi [name], female    DOB: 03-15-1991, 28 y.o.   MRN: [id number]  HPI: Heidi [name] is a 28 y.o. female presenting on 01/08/2019 for Knee Pain (bilateral knees- requesting injectons)   HPI Type 2 diabetes mellitus Patient comes in today for recheck of his diabetes. Patient has been currently taking metformin. Patient is currently on an ACE inhibitor/ARB. Patient has not seen an ophthalmologist this year. Patient denies any issues with their feet.   Hypertension Patient is currently on lisinopril-hydrochlorothiazide, and their blood pressure today is 141/84 but runs low in the 120s at home per patient. Patient denies any lightheadedness or dizziness. Patient denies headaches, blurred vision, chest pains, shortness of breath, or weakness. Denies any side effects from medication and is content with current medication.   GERD Patient is currently on no medication currently and has been doing well with that.  She denies any major symptoms or abdominal pain or belching or burping. She denies any blood in her stool or lightheadedness or dizziness.   Major depression anxiety. Patient has a psychiatrist and psychologist for major depression anxiety has been seeing them and continues to see them and is treated for them with Abilify and Wellbutrin and Pristiq and she uses the occasional Xanax for muscular panic attacks.  She denies any suicidal ideations.  Patient has continued bilateral knee arthritis that she saw a rheumatologist for was deemed to be probable osteoarthritis.  Patient has had previous injections and is been 5 months since injections would like to see if she can do one today.  Relevant past medical, surgical, family and social history reviewed and updated as indicated. Interim medical history  since our last visit reviewed. Allergies and medications reviewed and updated.  Review of Systems  Constitutional: Negative for chills and fever.  Eyes: Negative for redness and visual disturbance.  Respiratory: Negative for chest tightness and shortness of breath.   Cardiovascular: Negative for chest pain and leg swelling.  Musculoskeletal: Positive for arthralgias. Negative for back pain, gait problem and joint swelling.  Skin: Negative for color change and rash.  Neurological: Negative for light-headedness and headaches.  Psychiatric/Behavioral: Negative for agitation, behavioral problems, dysphoric mood, self-injury, sleep disturbance and suicidal ideas. The patient is not nervous/anxious.   All other systems reviewed and are negative.   Per HPI unless specifically indicated above   Allergies as of 01/08/2019      Reactions   Hydroxyzine    Causes her more anxiety.   Linzess [linaclotide] Hives   Lorazepam    Sedation   Morphine And Related Other (See Comments)   Makes pt.aggressive   Quetiapine    Sweating, irritability      Medication List       Accurate as of Jan 08, 2019  2:38 PM. If you have any questions, ask your nurse or doctor.        STOP taking these medications   acetaminophen-codeine 300-30 MG tablet Commonly known as:  TYLENOL #3 Stopped by:  Fransisca Kaufmann Dereka Lueras, MD   benzonatate 200 MG capsule Commonly known as:  TESSALON Stopped by:  Worthy Rancher, MD     TAKE these medications   albuterol 108 (90 Base) MCG/ACT inhaler  Commonly known as:  VENTOLIN HFA Inhale 2 puffs into the lungs every 6 (six) hours as needed for wheezing or shortness of breath.   ALPRAZolam 0.25 MG tablet Commonly known as:  XANAX Take 0.5-1 tablets (0.125-0.25 mg total) by mouth 2 (two) times daily as needed for anxiety (depends on anxiety if takes 05-0 tablet).   amLODipine 5 MG tablet Commonly known as:  NORVASC TAKE 1 TABLET BY MOUTH ONCE DAILY   ARIPiprazole 5  MG tablet Commonly known as:  ABILIFY Take 1 tablet (5 mg total) by mouth daily.   buPROPion 300 MG 24 hr tablet Commonly known as:  WELLBUTRIN XL Take 1 tablet (300 mg total) by mouth daily.   buPROPion 150 MG 24 hr tablet Commonly known as:  WELLBUTRIN XL 450 mg daily (300 mg + 150 mg)   cyclobenzaprine 10 MG tablet Commonly known as:  FLEXERIL TAKE 1 TABLET BY MOUTH THREE TIMES DAILY AS NEEDED FOR MUSCLE SPASM   desvenlafaxine 100 MG 24 hr tablet Commonly known as:  PRISTIQ Take 2 tablets (200 mg total) by mouth daily.   Dexilant 60 MG capsule Generic drug:  dexlansoprazole TAKE 1 CAPSULE BY MOUTH ONCE DAILY (ENDOSCOPIC FAILURE ON NEXIUM. POOR CONTROL OF SYMPTOMS ON PANTOPRAZOLE) What changed:  See the new instructions.   fluconazole 150 MG tablet Commonly known as:  DIFLUCAN Take 1 now and 1 in 3 days   glucose blood test strip Commonly known as:  Accu-Chek Guide Use to check BG once daily at varying times of day.   Lancets 30G Misc Dispense Accu-Chek guide lancets.  Check blood sugar at varying times once per day.   lisinopril-hydrochlorothiazide 20-12.5 MG tablet Commonly known as:  Zestoretic Take 1 tablet by mouth daily.   meloxicam 7.5 MG tablet Commonly known as:  MOBIC TAKE 2 TABLETS BY MOUTH ONCE DAILY   metFORMIN 1000 MG tablet Commonly known as:  GLUCOPHAGE Take 1 tablet (1,000 mg total) by mouth 2 (two) times daily with a meal. (Please make you 4 mos ckup)   traZODone 50 MG tablet Commonly known as:  DESYREL Take 1 tablet (50 mg total) by mouth at bedtime as needed for sleep.        Objective:   BP (!) 141/84   Pulse 93   Temp (!) 97 F (36.1 C) (Oral)   Ht '5\' 7"'  (1.702 m)   Wt 284 lb 9.6 oz (129.1 kg)   LMP 05/28/2017 Comment: [place]  BMI 44.57 kg/m   Wt Readings from Last 3 Encounters:  01/08/19 284 lb 9.6 oz (129.1 kg)  10/12/18 281 lb (127.5 kg)  09/15/18 284 lb 3.2 oz (128.9 kg)    Physical Exam Vitals signs and nursing note  reviewed.  Constitutional:      General: She is not in acute distress.    Appearance: She is well-developed. She is not diaphoretic.  Eyes:     Conjunctiva/sclera: Conjunctivae normal.  Cardiovascular:     Rate and Rhythm: Normal rate and regular rhythm.     Heart sounds: Normal heart sounds. No murmur.  Pulmonary:     Effort: Pulmonary effort is normal. No respiratory distress.     Breath sounds: Normal breath sounds. No wheezing.  Musculoskeletal: Normal range of motion.        General: Tenderness (Bilateral knee tenderness, no swelling or loss of range of motion noted, mostly at the medial and lateral joint lines bilaterally) present.  Skin:    General: Skin is warm and  dry.     Findings: No rash.  Neurological:     Mental Status: She is alert and oriented to person, place, and time.     Coordination: Coordination normal.  Psychiatric:        Behavior: Behavior normal.     Knee injection bilateral: Consent form signed. Risk factors of bleeding and infection discussed with patient and patient is agreeable towards injection. Patient prepped with Betadine. Lateral approach towards injection used. Injected 80 mg of Depo-Medrol and 1 mL of 2% lidocaine. Patient tolerated procedure well and no side effects from noted. Minimal to no bleeding. Simple bandage applied after.   Assessment & Plan:   Problem List Items Addressed This Visit      Cardiovascular and Mediastinum   Hypertension associated with diabetes ([place])   Relevant Orders   CMP14+EGFR   Lipid panel     Digestive   GERD (gastroesophageal reflux disease)   Relevant Orders   CBC with Differential/Platelet     Endocrine   Type 2 diabetes mellitus ([place]) - Primary   Relevant Orders   Bayer DCA Hb A1c Waived   CMP14+EGFR   Lipid panel     Other   MDD (major depressive disorder), recurrent episode, moderate ([place])    Other Visit Diagnoses    Osteoarthritis of right knee, unspecified osteoarthritis type        Relevant Medications   methylPREDNISolone acetate (DEPO-MEDROL) injection 80 mg (Start on 01/08/2019  2:45 PM)   methylPREDNISolone acetate (DEPO-MEDROL) injection 80 mg (Start on 01/08/2019  2:45 PM)   Osteoarthritis of left knee, unspecified osteoarthritis type       Relevant Medications   methylPREDNISolone acetate (DEPO-MEDROL) injection 80 mg (Start on 01/08/2019  2:45 PM)   methylPREDNISolone acetate (DEPO-MEDROL) injection 80 mg (Start on 01/08/2019  2:45 PM)      Continue current medications, will do blood work today and see back in 4 months.  Follow up plan: Return in about 4 months (around 05/11/2019), or if symptoms worsen or fail to improve, for Diabetes and hypertension arthritis recheck.  Counseling provided for all of the vaccine components Orders Placed This Encounter  Procedures  . Bayer DCA Hb A1c Waived  . CBC with Differential/Platelet  . CMP14+EGFR  . Lipid panel    Caryl Pina, MD [facility] 01/08/2019, 2:38 PM

## 2019-01-09 LAB — CBC WITH DIFFERENTIAL/PLATELET
Basophils Absolute: 0.1 10*3/uL (ref 0.0–0.2)
Basos: 1 %
EOS (ABSOLUTE): 0.1 10*3/uL (ref 0.0–0.4)
Eos: 1 %
Hematocrit: 41.8 % (ref 34.0–46.6)
Hemoglobin: 13.6 g/dL (ref 11.1–15.9)
Immature Grans (Abs): 0 10*3/uL (ref 0.0–0.1)
Immature Granulocytes: 0 %
Lymphocytes Absolute: 3.3 10*3/uL — ABNORMAL HIGH (ref 0.7–3.1)
Lymphs: 28 %
MCH: 28.5 pg (ref 26.6–33.0)
MCHC: 32.5 g/dL (ref 31.5–35.7)
MCV: 88 fL (ref 79–97)
Monocytes Absolute: 0.5 10*3/uL (ref 0.1–0.9)
Monocytes: 4 %
Neutrophils Absolute: 7.8 10*3/uL — ABNORMAL HIGH (ref 1.4–7.0)
Neutrophils: 66 %
Platelets: 315 10*3/uL (ref 150–450)
RBC: 4.77 x10E6/uL (ref 3.77–5.28)
RDW: 14.7 % (ref 11.7–15.4)
WBC: 11.8 10*3/uL — ABNORMAL HIGH (ref 3.4–10.8)

## 2019-01-09 LAB — CMP14+EGFR
ALT: 45 IU/L — ABNORMAL HIGH (ref 0–32)
AST: 26 IU/L (ref 0–40)
Albumin/Globulin Ratio: 2 (ref 1.2–2.2)
Albumin: 4.6 g/dL (ref 3.9–5.0)
Alkaline Phosphatase: 143 IU/L — ABNORMAL HIGH (ref 39–117)
BUN/Creatinine Ratio: 12 (ref 9–23)
BUN: 11 mg/dL (ref 6–20)
Bilirubin Total: <0.2 mg/dL (ref 0.0–1.2)
CO2: 23 mmol/L (ref 20–29)
Calcium: 10 mg/dL (ref 8.7–10.2)
Chloride: 97 mmol/L (ref 96–106)
Creatinine, Ser: 0.92 mg/dL (ref 0.57–1.00)
GFR calc Af Amer: 97 mL/min/{1.73_m2} (ref >59)
GFR calc non Af Amer: 84 mL/min/{1.73_m2} (ref >59)
Globulin, Total: 2.3 g/dL (ref 1.5–4.5)
Glucose: 126 mg/dL — ABNORMAL HIGH (ref 65–99)
Potassium: 4.2 mmol/L (ref 3.5–5.2)
Sodium: 139 mmol/L (ref 134–144)
Total Protein: 6.9 g/dL (ref 6.0–8.5)

## 2019-01-09 LAB — LIPID PANEL
Chol/HDL Ratio: 4.9 ratio — ABNORMAL HIGH (ref 0.0–4.4)
Cholesterol, Total: 136 mg/dL (ref 100–199)
HDL: 28 mg/dL — ABNORMAL LOW (ref >39)
LDL Calculated: 66 mg/dL (ref 0–99)
Triglycerides: 211 mg/dL — ABNORMAL HIGH (ref 0–149)
VLDL Cholesterol Cal: 42 mg/dL — ABNORMAL HIGH (ref 5–40)

## 2019-01-12 ENCOUNTER — Ambulatory Visit (INDEPENDENT_AMBULATORY_CARE_PROVIDER_SITE_OTHER): Payer: Medicaid Other | Admitting: Licensed Clinical Social Worker

## 2019-01-12 ENCOUNTER — Encounter (HOSPITAL_COMMUNITY): Payer: Self-pay | Admitting: Licensed Clinical Social Worker

## 2019-01-12 ENCOUNTER — Other Ambulatory Visit: Payer: Self-pay

## 2019-01-12 DIAGNOSIS — F331 Major depressive disorder, recurrent, moderate: Secondary | ICD-10-CM | POA: Diagnosis not present
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
## 2019-01-25 ENCOUNTER — Other Ambulatory Visit: Payer: Self-pay | Admitting: Family Medicine

## 2019-01-25 DIAGNOSIS — I10 Essential (primary) hypertension: Secondary | ICD-10-CM

## 2019-02-08 ENCOUNTER — Other Ambulatory Visit: Payer: Self-pay

## 2019-02-08 ENCOUNTER — Ambulatory Visit (INDEPENDENT_AMBULATORY_CARE_PROVIDER_SITE_OTHER): Payer: Medicaid Other | Admitting: Licensed Clinical Social Worker

## 2019-02-08 DIAGNOSIS — F331 Major depressive disorder, recurrent, moderate: Secondary | ICD-10-CM

## 2019-02-09 ENCOUNTER — Encounter (HOSPITAL_COMMUNITY): Payer: Self-pay | Admitting: Licensed Clinical Social Worker

## 2019-02-09 NOTE — Progress Notes (Deleted)
BH MD/PA/NP OP Progress Note  02/09/2019 3:15 PM Heidi [name]  MRN:  [id number]  Chief Complaint:  HPI: *** Visit Diagnosis: No diagnosis found.  Past Psychiatric History: Please see initial evaluation for full details. I have reviewed the history. No updates at this time.     Past Medical History:  Past Medical History:  Diagnosis Date  . Abdominal pain 01/10/2016  . Anxiety   . Back pain   . Bloating 01/10/2016  . Depression   . GERD (gastroesophageal reflux disease)   . Hematuria 01/10/2016  . Herpes simplex virus (HSV) infection   . Hypertension   . Osteoarthritis   . Prediabetes   . Schizo-affective schizophrenia (HCC)   . Type 2 diabetes mellitus (HCC) 03/28/2016  . Urinary frequency 01/10/2016    Past Surgical History:  Procedure Laterality Date  . BILATERAL SALPINGECTOMY Bilateral 06/24/2017   Procedure: BILATERAL SALPINGECTOMY;  Surgeon: Tilda BurrowFerguson, John V, MD;  Location: [facility];  Service: Gynecology;  Laterality: Bilateral;  . CESAREAN SECTION    . COLONOSCOPY WITH PROPOFOL N/A 03/17/2017   Procedure: COLONOSCOPY WITH PROPOFOL;  Surgeon: Corbin Adeourk, Robert M, MD;  Location: [facility];  Service: Endoscopy;  Laterality: N/A;  9:30am  . DILATION AND CURETTAGE OF UTERUS    . ESOPHAGOGASTRODUODENOSCOPY (EGD) WITH PROPOFOL N/A 03/17/2017   Procedure: ESOPHAGOGASTRODUODENOSCOPY (EGD) WITH PROPOFOL;  Surgeon: Corbin Adeourk, Robert M, MD;  Location: [facility];  Service: Endoscopy;  Laterality: N/A;  . ESSURE TUBAL LIGATION    . SUPRACERVICAL ABDOMINAL HYSTERECTOMY N/A 06/24/2017   Procedure: SUPRACERVICAL ABDOMINAL HYSTERECTOMY, WIDE EXCISION OF CICATRIX;  Surgeon: Tilda BurrowFerguson, John V, MD;  Location: [facility];  Service: Gynecology;  Laterality: N/A;    Family Psychiatric History: Please see initial evaluation for full details. I have reviewed the history. No updates at this time.     Family History:  Family History  Problem Relation Age of Onset  . Depression Mother   .  Hypertension Mother   . Diabetes Mother   . Mental illness Father   . Hypertension Father   . Diabetes Father   . Anxiety disorder Father   . Cancer Maternal Aunt   . Diabetes Paternal Grandmother   . Hypertension Paternal Grandmother   . Arthritis Paternal Grandmother   . Stroke Paternal Grandfather   . Heart disease Paternal Grandfather   . Hypertension Paternal Grandfather   . Diabetes Paternal Grandfather   . Schizophrenia Maternal Grandfather   . Alcohol abuse Maternal Grandfather   . Other Sister        recovering addict  . Schizophrenia Sister   . Irritable bowel syndrome Sister   . Drug abuse Sister   . Other Sister        recovering addict  . Bipolar disorder Sister   . Seizures Sister   . Drug abuse Sister   . Schizophrenia Sister   . Depression Paternal Aunt   . Anxiety disorder Paternal Aunt   . Colon cancer Neg Hx   . Inflammatory bowel disease Neg Hx   . Celiac disease Neg Hx     Social History:  Social History   Socioeconomic History  . Marital status: Legally Separated    Spouse name: Not on file  . Number of children: 1  . Years of education: HS  . Highest education level: Not on file  Occupational History  . Occupation: Unemployed  Social Needs  . Financial resource strain: Not on file  . Food insecurity:    Worry:  Not on file    Inability: Not on file  . Transportation needs:    Medical: Not on file    Non-medical: Not on file  Tobacco Use  . Smoking status: Current Every Day Smoker    Packs/day: 1.00    Years: 10.00    Pack years: 10.00    Types: Cigarettes  . Smokeless tobacco: Never Used  Substance and Sexual Activity  . Alcohol use: Yes    Alcohol/week: 0.0 standard drinks    Comment: Occasionally 7  . Drug use: Yes    Frequency: 4.0 times per week    Types: Marijuana    Comment: Occasionally  . Sexual activity: Yes    Partners: Male    Birth control/protection: Surgical    Comment: tubal sch  Lifestyle  . Physical  activity:    Days per week: Not on file    Minutes per session: Not on file  . Stress: Not on file  Relationships  . Social connections:    Talks on phone: Not on file    Gets together: Not on file    Attends religious service: Not on file    Active member of club or organization: Not on file    Attends meetings of clubs or organizations: Not on file    Relationship status: Not on file  Other Topics Concern  . Not on file  Social History Narrative   Lives at home with her boyfriend.   Right-handed.   Drinks approximately two 2-liter sodas per day.   Drinks 1 cup coffee per day.    Allergies:  Allergies  Allergen Reactions  . Hydroxyzine     Causes her more anxiety.  [name]. Linzess [Linaclotide] Hives  . Lorazepam     Sedation  . Morphine And Related Other (See Comments)    Makes pt.aggressive  . Quetiapine     Sweating, irritability    Metabolic Disorder Labs: Lab Results  Component Value Date   HGBA1C 6.3 01/08/2019   MPG 220.21 06/20/2017   No results found for: PROLACTIN Lab Results  Component Value Date   CHOL 136 01/08/2019   TRIG 211 (H) 01/08/2019   HDL 28 (L) 01/08/2019   CHOLHDL 4.9 (H) 01/08/2019   LDLCALC 66 01/08/2019   LDLCALC 66 03/13/2018   Lab Results  Component Value Date   TSH 0.890 04/30/2018   TSH 1.930 09/26/2015    Therapeutic Level Labs: No results found for: LITHIUM No results found for: VALPROATE No components found for:  CBMZ  Current Medications: Current Outpatient Medications  Medication Sig Dispense Refill  . albuterol (PROVENTIL HFA;VENTOLIN HFA) 108 (90 Base) MCG/ACT inhaler Inhale 2 puffs into the lungs every 6 (six) hours as needed for wheezing or shortness of breath. 1 Inhaler 2  . ALPRAZolam (XANAX) 0.25 MG tablet Take 0.5-1 tablets (0.125-0.25 mg total) by mouth 2 (two) times daily as needed for anxiety (depends on anxiety if takes 05-0 tablet). 15 tablet 2  . amLODipine (NORVASC) 5 MG tablet Take 1 tablet by mouth once  daily 90 tablet 1  . ARIPiprazole (ABILIFY) 5 MG tablet Take 1 tablet (5 mg total) by mouth daily. 90 tablet 0  . buPROPion (WELLBUTRIN XL) 150 MG 24 hr tablet 450 mg daily (300 mg + 150 mg) 30 tablet 1  . buPROPion (WELLBUTRIN XL) 300 MG 24 hr tablet Take 1 tablet (300 mg total) by mouth daily. 90 tablet 0  . cyclobenzaprine (FLEXERIL) 10 MG tablet TAKE 1 TABLET BY  MOUTH THREE TIMES DAILY AS NEEDED FOR MUSCLE SPASM 30 tablet 2  . desvenlafaxine (PRISTIQ) 100 MG 24 hr tablet Take 2 tablets (200 mg total) by mouth daily. 180 tablet 0  . DEXILANT 60 MG capsule TAKE 1 CAPSULE BY MOUTH ONCE DAILY (ENDOSCOPIC FAILURE ON NEXIUM. POOR CONTROL OF SYMPTOMS ON PANTOPRAZOLE) (Patient taking differently: Take 60 mg by mouth every evening. ) 30 capsule 11  . fluconazole (DIFLUCAN) 150 MG tablet Take 1 now and 1 in 3 days 2 tablet 1  . glucose blood (ACCU-CHEK GUIDE) test strip Use to check BG once daily at varying times of day. 100 each 3  . Lancets 30G MISC Dispense Accu-Chek guide lancets.  Check blood sugar at varying times once per day. 100 each 11  . lisinopril-hydrochlorothiazide (ZESTORETIC) 20-12.5 MG tablet Take 1 tablet by mouth daily. 90 tablet 0  . meloxicam (MOBIC) 7.5 MG tablet TAKE 2 TABLETS BY MOUTH ONCE DAILY 60 tablet 2  . metFORMIN (GLUCOPHAGE) 1000 MG tablet Take 1 tablet (1,000 mg total) by mouth 2 (two) times daily with a meal. (Please make you 4 mos ckup) 60 tablet 0  . traZODone (DESYREL) 50 MG tablet Take 1 tablet (50 mg total) by mouth at bedtime as needed for sleep. 30 tablet 2   No current facility-administered medications for this visit.      Musculoskeletal: Strength & Muscle Tone: N/A Gait & Station: N/A Patient leans: N/A  Psychiatric Specialty Exam: ROS  Last menstrual period 05/28/2017.There is no height or weight on file to calculate BMI.  General Appearance: {Appearance:22683}  Eye Contact:  {BHH EYE CONTACT:22684}  Speech:  Clear and Coherent  Volume:  Normal   Mood:  {BHH MOOD:22306}  Affect:  {Affect (PAA):22687}  Thought Process:  Coherent  Orientation:  Full (Time, Place, and Person)  Thought Content: Logical   Suicidal Thoughts:  {ST/HT (PAA):22692}  Homicidal Thoughts:  {ST/HT (PAA):22692}  Memory:  Immediate;   Good  Judgement:  {Judgement (PAA):22694}  Insight:  {Insight (PAA):22695}  Psychomotor Activity:  Normal  Concentration:  Concentration: Good and Attention Span: Good  Recall:  Good  Fund of Knowledge: Good  Language: Good  Akathisia:  No  Handed:  Right  AIMS (if indicated): not done  Assets:  Communication Skills Desire for Improvement  ADL's:  Intact  Cognition: WNL  Sleep:  {B[facility] GOOD/FAIR/POOR:22877}   Screenings: GAD-7     Virtual [facility] Phone Follow Up from 10/15/2017 in [facility]  Total GAD-7 Score  0    PHQ2-9     Office Visit from 10/12/2018 in [facility] Office Visit from 08/24/2018 in [facility] Office Visit from 04/23/2018 in [facility] Office Visit from [facility]/15/2018 in [facility] Office Visit from 03/13/2018 in [facility]  PHQ-2 Total Score  2  2  2  2  2   PHQ-9 Total Score  9  9  6  9  9        Assessment and Plan:  Heidi MyrtleJessica [name] is a [age] y.o. year old female with a history of depression,type II diabetes,hypertension, status post supracervical hysterectomywith bilateral salpingectomy , who presents for follow up appointment for No diagnosis found.  # MDD, moderate, recurrent without psychotic features # R/o PTSD  Patient reports slight worsening in depressive symptoms in the context of loss of her sister from overdose.  Other psychosocial stressors includes recent break-up with her other boyfriend.  She also does  have a feeling of abandonment from prior relationships and her mother.  Will uptitrate bupropion to target depression.  Discussed potential risk of headache  and worsening in anxiety.  She has no known history of seizure.  Will continue Pristiq to target depression.  Will continue Abilify as adjunctive treatment for depression.  Discussed risk of EPS and metabolic side effect.  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  Referral was made.   # Alcohol use  # Marijuana use  She is at pre-contemplative stage for marijuana use.  Will continue motivational interview.   Plan  1.ContinuePristiq200 mg daily  2.Increasebupropion450 mg daily 3. ContinueAbilify 5 mg at night  4.  Next appointment 6/15 at 4:10 for 20 mins, video - Referred to therapy - on Xanax 0.125 mg daily as needed   The patient demonstrates the following risk factors for suicide: Chronic risk factors for suicide include:psychiatric disorder ofdepression, substance use disorder and previous self-harmof banging her head. Acute risk factorsfor suicide include: N/A. Protective factorsfor this patient include: positive social support and hope for the future. Considering these factors, the overall suicide risk at this point appears to bechronically elevated, but not at imminent risk. She is future oriented and is amenable to treatment plan.Patient isappropriate for outpatient follow up. Emergency resources which includes 911, ED, suicide crisis line [phone]) are discussed.She denies gun access at home   [name], MD 02/09/2019, 3:15 PM

## 2019-02-09 NOTE — Progress Notes (Signed)
Virtual Visit via Video Note  I connected with Heidi [name] on 02/09/19 at  4:00 PM EDT by a video enabled telemedicine application and verified that I am speaking with the correct person using two identifiers.  Location: Patient: Home Provider: Office   I discussed the limitations of evaluation and management by telemedicine and the availability of in person appointments. The patient expressed understanding and agreed to proceed.   THERAPIST PROGRESS NOTE  Session Time: 4:00pm -4:40 pm  Participation Level: Active  Behavioral Response: CasualAlertDepressed  Type of Therapy: Individual Therapy  Treatment Goals addressed: Coping  Interventions: CBT and Solution Focused  Summary: Heidi [name] is a 28 y.o. female who presents oriented x5 (person, place, situation, time, and object), casually dressed, appropriately groomed, average heigh, overweight, and cooperative to address mood. Patient has a history of medical treatment including diabetes, GERD, and hypertension. Patient has a history of mental health treatment including outpatient therapy and medication management. Patient denies suicidal and homicidal ideations. Patient denies psychosis including auditory and visual hallucinations. Patient denies substance abuse. She is at low risk for lethality at this time.  Physically: Patient's sleep has been all over the place. Her appetite has been reduced. Patient has had lack of motivation.  Spiritually/values: No issues identified.  Relationships: Patient has been pushing others away. She wants to shut down when there is an argument.  Emotionally/Mentally/Behavior:  Patient has been ok. She has had irritability and depression. Patient is worried about having schizoaffective disorder. Patient has not been motivated to do things including clean her home. After discussion, patient was able to identify one step she can take to deal with her motivation including clean the bathroom. Patient  understood that if she took small steps to work on her home then she would feel a little better.   Patient engaged in session. She responded well to interventions. Patient continues to meet criteria for MDD (major depressive disorder), recurrent episode, moderate. Patient will continue in outpatient therapy due to being the least restrictive service to meet her needs. Patient made minimal progress on her goals.    Suicidal/Homicidal: Negativewithout intent/plan  Therapist Response: Therapist reviewed patient's recent thoughts and behaviors. Therapist utilized CBT to address mood. Therapist processed patient's feelings to identify triggers for mood. Therapist discussed with patient small steps that she could take to improve her mood.   Plan: Return again in 4 weeks.  Diagnosis: Axis I: MDD (major depressive disorder), recurrent episode, moderate    Axis II: No diagnosis   I discussed the assessment and treatment plan with the patient. The patient was provided an opportunity to ask questions and all were answered. The patient agreed with the plan and demonstrated an understanding of the instructions.   The patient was advised to call back or seek an in-person evaluation if the symptoms worsen or if the condition fails to improve as anticipated.  I provided 40 minutes of non-face-to-face time during this encounter.    Glori Bickers, LCSW 02/09/2019

## 2019-02-13 ENCOUNTER — Other Ambulatory Visit: Payer: Self-pay | Admitting: Family Medicine

## 2019-02-13 DIAGNOSIS — G8929 Other chronic pain: Secondary | ICD-10-CM

## 2019-02-13 DIAGNOSIS — M545 Low back pain, unspecified: Secondary | ICD-10-CM

## 2019-02-15 ENCOUNTER — Ambulatory Visit (HOSPITAL_COMMUNITY): Payer: Medicaid Other | Admitting: Psychiatry

## 2019-03-19 ENCOUNTER — Other Ambulatory Visit (HOSPITAL_COMMUNITY): Payer: Self-pay | Admitting: Psychiatry

## 2019-03-19 MED ORDER — BUPROPION HCL ER (XL) 150 MG PO TB24: ORAL_TABLET | ORAL | 0 refills | Status: DC

## 2019-03-19 MED ORDER — BUPROPION HCL ER (XL) 300 MG PO TB24: 300.0000 mg | ORAL_TABLET | Freq: Every day | ORAL | 0 refills | Status: DC

## 2019-03-23 ENCOUNTER — Other Ambulatory Visit: Payer: Self-pay | Admitting: Family Medicine

## 2019-03-23 DIAGNOSIS — G8929 Other chronic pain: Secondary | ICD-10-CM

## 2019-03-29 ENCOUNTER — Other Ambulatory Visit: Payer: Self-pay | Admitting: Family Medicine

## 2019-03-31 NOTE — Progress Notes (Signed)
Virtual Visit via Video Note  I connected with Heidi [name] on 04/06/19 at  4:20 PM EDT by a video enabled telemedicine application and verified that I am speaking with the correct person using two identifiers.   I discussed the limitations of evaluation and management by telemedicine and the availability of in person appointments. The patient expressed understanding and agreed to proceed.     I discussed the assessment and treatment plan with the patient. The patient was provided an opportunity to ask questions and all were answered. The patient agreed with the plan and demonstrated an understanding of the instructions.   The patient was advised to call back or seek an in-person evaluation if the symptoms worsen or if the condition fails to improve as anticipated.  I provided 25 minutes of non-face-to-face time during this encounter.   Heidi Hotter, MD    [facility] MD/PA/NP OP Progress Note  04/06/2019 5:09 PM Heidi [name]  MRN:  [id number]  Chief Complaint:  Chief Complaint    Depression; Follow-up     HPI:  This is a follow-up appointment for depression.  She states that she has outbursts of depression and has high irritability.  She tends to get upset easily, night and cannot control her emotion.  She states that she is tired of feeling irritable, and angry.  She occasionally hits her head with her fist when she is overwhelmed.  Her boyfriend usually helps her to get out from this habit. Although she did have SI, she denies any plan or intent. She feels irritable at her father, who has stayed with the patient since last March. She told him that he needs to leave by September. She states that she does not want to resent him as he is her father. She agrees that this is one of her many stressors, which has been added one after another. She states that she "don't" when she is asked how she is handling her loss of her sister. She tearfully admits that she thinks about her often. She has  insomnia to hypersomnia. She feels fatigue.  She has no motivation.  She denies current SI/HI, or SIB. She feels anxious, tense. She has occasional panic attacks. She drinks a bottle of the last weekend, although she denies regular use. She smokes marijuana every day for sleep and to calm herself.   Social- her mother lives in [place]. The father of her son lives next to her mother. She reports good relationship with her son with ADHD. Although she wants to have him full time, she does not want to take away his father from him and feels content about the current co-parenting. She denies any mistreatment of her son.    Wt Readings from Last 3 Encounters:  01/08/19 284 lb 9.6 oz (129.1 kg)  11/06/18 281 lb (127.5 kg)  10/12/18 281 lb (127.5 kg)     Visit Diagnosis:    ICD-10-CM   1. MDD (major depressive disorder), recurrent episode, moderate (HCC)  F33.1     Past Psychiatric History: Please see initial evaluation for full details. I have reviewed the history. No updates at this time.     Past Medical History:  Past Medical History:  Diagnosis Date  . Abdominal pain 01/10/2016  . Anxiety   . Back pain   . Bloating 01/10/2016  . Depression   . GERD (gastroesophageal reflux disease)   . Hematuria 01/10/2016  . Herpes simplex virus (HSV) infection   . Hypertension   . Osteoarthritis   .  Prediabetes   . Schizo-affective schizophrenia (HCC)   . Type 2 diabetes mellitus (HCC) 03/28/2016  . Urinary frequency 01/10/2016    Past Surgical History:  Procedure Laterality Date  . BILATERAL SALPINGECTOMY Bilateral 06/24/2017   Procedure: BILATERAL SALPINGECTOMY;  Surgeon: Tilda BurrowFerguson, John V, MD;  Location: [facility];  Service: Gynecology;  Laterality: Bilateral;  . CESAREAN SECTION    . COLONOSCOPY WITH PROPOFOL N/A 03/17/2017   Procedure: COLONOSCOPY WITH PROPOFOL;  Surgeon: Corbin Adeourk, Robert M, MD;  Location: [facility];  Service: Endoscopy;  Laterality: N/A;  9:30am  . DILATION AND CURETTAGE OF  UTERUS    . ESOPHAGOGASTRODUODENOSCOPY (EGD) WITH PROPOFOL N/A 03/17/2017   Procedure: ESOPHAGOGASTRODUODENOSCOPY (EGD) WITH PROPOFOL;  Surgeon: Corbin Adeourk, Robert M, MD;  Location: [facility];  Service: Endoscopy;  Laterality: N/A;  . ESSURE TUBAL LIGATION    . SUPRACERVICAL ABDOMINAL HYSTERECTOMY N/A 06/24/2017   Procedure: SUPRACERVICAL ABDOMINAL HYSTERECTOMY, WIDE EXCISION OF CICATRIX;  Surgeon: Tilda BurrowFerguson, John V, MD;  Location: [facility];  Service: Gynecology;  Laterality: N/A;    Family Psychiatric History: Please see initial evaluation for full details. I have reviewed the history. No updates at this time.     Family History:  Family History  Problem Relation Age of Onset  . Depression Mother   . Hypertension Mother   . Diabetes Mother   . Mental illness Father   . Hypertension Father   . Diabetes Father   . Anxiety disorder Father   . Cancer Maternal Aunt   . Diabetes Paternal Grandmother   . Hypertension Paternal Grandmother   . Arthritis Paternal Grandmother   . Stroke Paternal Grandfather   . Heart disease Paternal Grandfather   . Hypertension Paternal Grandfather   . Diabetes Paternal Grandfather   . Schizophrenia Maternal Grandfather   . Alcohol abuse Maternal Grandfather   . Other Sister        recovering addict  . Schizophrenia Sister   . Irritable bowel syndrome Sister   . Drug abuse Sister   . Other Sister        recovering addict  . Bipolar disorder Sister   . Seizures Sister   . Drug abuse Sister   . Schizophrenia Sister   . Depression Paternal Aunt   . Anxiety disorder Paternal Aunt   . Colon cancer Neg Hx   . Inflammatory bowel disease Neg Hx   . Celiac disease Neg Hx     Social History:  Social History   Socioeconomic History  . Marital status: Legally Separated    Spouse name: Not on file  . Number of children: 1  . Years of education: HS  . Highest education level: Not on file  Occupational History  . Occupation: Unemployed  Social Needs   . Financial resource strain: Not on file  . Food insecurity    Worry: Not on file    Inability: Not on file  . Transportation needs    Medical: Not on file    Non-medical: Not on file  Tobacco Use  . Smoking status: Current Every Day Smoker    Packs/day: 1.00    Years: 10.00    Pack years: 10.00    Types: Cigarettes  . Smokeless tobacco: Never Used  Substance and Sexual Activity  . Alcohol use: Yes    Alcohol/week: 0.0 standard drinks    Comment: Occasionally 7  . Drug use: Yes    Frequency: 4.0 times per week    Types: Marijuana    Comment:  Occasionally  . Sexual activity: Yes    Partners: Male    Birth control/protection: Surgical    Comment: tubal sch  Lifestyle  . Physical activity    Days per week: Not on file    Minutes per session: Not on file  . Stress: Not on file  Relationships  . Social Musicianconnections    Talks on phone: Not on file    Gets together: Not on file    Attends religious service: Not on file    Active member of club or organization: Not on file    Attends meetings of clubs or organizations: Not on file    Relationship status: Not on file  Other Topics Concern  . Not on file  Social History Narrative   Lives at home with her boyfriend.   Right-handed.   Drinks approximately two 2-liter sodas per day.   Drinks 1 cup coffee per day.    Allergies:  Allergies  Allergen Reactions  . Hydroxyzine     Causes her more anxiety.  [name]. Linzess [Linaclotide] Hives  . Lorazepam     Sedation  . Morphine And Related Other (See Comments)    Makes pt.aggressive  . Quetiapine     Sweating, irritability    Metabolic Disorder Labs: Lab Results  Component Value Date   HGBA1C 6.3 01/08/2019   MPG 220.21 06/20/2017   No results found for: PROLACTIN Lab Results  Component Value Date   CHOL 136 01/08/2019   TRIG 211 (H) 01/08/2019   HDL 28 (L) 01/08/2019   CHOLHDL 4.9 (H) 01/08/2019   LDLCALC 66 01/08/2019   LDLCALC 66 03/13/2018   Lab Results   Component Value Date   TSH 0.890 04/30/2018   TSH 1.930 09/26/2015    Therapeutic Level Labs: No results found for: LITHIUM No results found for: VALPROATE No components found for:  CBMZ  Current Medications: Current Outpatient Medications  Medication Sig Dispense Refill  . albuterol (PROVENTIL HFA;VENTOLIN HFA) 108 (90 Base) MCG/ACT inhaler Inhale 2 puffs into the lungs every 6 (six) hours as needed for wheezing or shortness of breath. 1 Inhaler 2  . ALPRAZolam (XANAX) 0.25 MG tablet Take 0.5-1 tablets (0.125-0.25 mg total) by mouth 2 (two) times daily as needed for anxiety (depends on anxiety if takes 05-0 tablet). (Patient not taking: Reported on 04/06/2019) 15 tablet 2  . amLODipine (NORVASC) 5 MG tablet Take 1 tablet by mouth once daily 90 tablet 1  . ARIPiprazole (ABILIFY) 10 MG tablet Take 1 tablet (10 mg total) by mouth daily. 30 tablet 1  . ARIPiprazole (ABILIFY) 5 MG tablet Take 1 tablet (5 mg total) by mouth daily. 90 tablet 0  . buPROPion (WELLBUTRIN XL) 300 MG 24 hr tablet Take 1 tablet (300 mg total) by mouth daily. 90 tablet 0  . cyclobenzaprine (FLEXERIL) 10 MG tablet Take 1 tablet by mouth three times daily as needed for muscle spasm 30 tablet 0  . desvenlafaxine (PRISTIQ) 100 MG 24 hr tablet Take 2 tablets (200 mg total) by mouth daily. 180 tablet 0  . DEXILANT 60 MG capsule TAKE 1 CAPSULE BY MOUTH ONCE DAILY (ENDOSCOPIC FAILURE ON NEXIUM. POOR CONTROL OF SYMPTOMS ON PANTOPRAZOLE) (Patient taking differently: Take 60 mg by mouth every evening. ) 30 capsule 11  . fluconazole (DIFLUCAN) 150 MG tablet Take 1 now and 1 in 3 days 2 tablet 1  . glucose blood (ACCU-CHEK GUIDE) test strip Use to check BG once daily at varying times of day. 100  each 3  . Lancets 30G MISC Dispense Accu-Chek guide lancets.  Check blood sugar at varying times once per day. 100 each 11  . lisinopril-hydrochlorothiazide (ZESTORETIC) 20-12.5 MG tablet Take 1 tablet by mouth daily. 90 tablet 0  .  meloxicam (MOBIC) 7.5 MG tablet Take 2 tablets by mouth once daily 180 tablet 0  . metFORMIN (GLUCOPHAGE) 1000 MG tablet Take 1 tablet (1,000 mg total) by mouth 2 (two) times daily with a meal. 180 tablet 0  . traZODone (DESYREL) 50 MG tablet Take 1 tablet (50 mg total) by mouth at bedtime as needed for sleep. (Patient not taking: Reported on 04/06/2019) 30 tablet 2   No current facility-administered medications for this visit.      Musculoskeletal: Strength & Muscle Tone: N/A Gait & Station: n/A Patient leans: N/A  Psychiatric Specialty Exam: Review of Systems  Psychiatric/Behavioral: Positive for depression. Negative for hallucinations, memory loss, substance abuse and suicidal ideas. The patient is nervous/anxious and has insomnia.   All other systems reviewed and are negative.   Last menstrual period 05/28/2017.There is no height or weight on file to calculate BMI.  General Appearance: Fairly Groomed  Eye Contact:  Good  Speech:  Clear and Coherent  Volume:  Normal  Mood:  Angry and Anxious  Affect:  Appropriate, Congruent and calm, down at times  Thought Process:  Coherent  Orientation:  Full (Time, Place, and Person)  Thought Content: Logical   Suicidal Thoughts:  No  Homicidal Thoughts:  No  Memory:  Immediate;   Good  Judgement:  Good  Insight:  Present  Psychomotor Activity:  Normal  Concentration:  Concentration: Good and Attention Span: Good  Recall:  Good  Fund of Knowledge: Good  Language: Good  Akathisia:  No  Handed:  Right  AIMS (if indicated): not done  Assets:  Communication Skills Desire for Improvement  ADL's:  Intact  Cognition: WNL  Sleep:  Poor   Screenings: GAD-7     Virtual BH Phone Follow Up from 10/15/2017 in [facility]  Total GAD-7 Score  0    PHQ2-9     Office Visit from 10/12/2018 in [facility] Office Visit from 08/24/2018 in [facility] Office Visit from 04/23/2018 in  [facility] Office Visit from 04/15/2018 in [facility] Office Visit from 03/13/2018 in [facility]  PHQ-2 Total Score  2  2  2  2  2   PHQ-9 Total Score  9  9  6  9  9        Assessment and Plan:  Estrella MyrtleJessica Beeck is a 28 y.o. year old female with a history of depression,  type II diabetes,hypertension, status post supracervical hysterectomywith bilateral salpingectomy, who presents for follow up appointment for depression.   # MDD, moderate, recurrent without psychotic features # r/o PTSD Patient reports worsening in irritability since the last visit.  Psychosocial stressors includes loss of her sister from overdose.  She also does have a feeling of abandonment which she attributes to prior relationships and her mother.   Will uptitrate Abilify as adjunctive treatment for depression.  Discussed potential metabolic side effect.  Will taper down bupropion given patient reports limited benefit from higher dose.  Will continue Pristiq to target depression and anxiety.  She does have cluster B traits, which she will greatly benefit from therapy; she is encouraged to continue to see Mr. sheets for therapy.   # Alcohol use #  marijuana use She has occasional alcohol use and uses marijuana every day.  Will continue motivational interview.   Plan  1.ContinuePristiq200 mg daily - she will contact the clinic if she needs a refill 2.Decreasebupropion300 mg daily(limited benefit from higher dose) 3. IncreaseAbilify 10 mg at night  4. Next appointment: 9/22 at 2 PM for 30 mins, video  I have reviewed suicide assessment in detail. No change in the following assessment.   The patient demonstrates the following risk factors for suicide: Chronic risk factors for suicide include:psychiatric disorder ofdepression, substance use disorder and previous self-harmof banging her head. Acute risk factorsfor suicide include: N/A.  Protective factorsfor this patient include: positive social support and hope for the future. Considering these factors, the overall suicide risk at this point appears to bechronically elevated, but not at imminent risk. She is future oriented and is amenable to treatment plan.Patient isappropriate for outpatient follow up. Emergency resources which includes 911, ED, suicide crisis line [phone]) are discussed.She denies gun access at home   The duration of this appointment visit was 25 minutes of face-to-face time with the patient.  Greater than 50% of this time was spent in counseling, explanation of  diagnosis, planning of further management, and coordination of care.  [name], MD 04/06/2019, 5:09 PM

## 2019-04-06 ENCOUNTER — Other Ambulatory Visit: Payer: Self-pay

## 2019-04-06 ENCOUNTER — Ambulatory Visit (INDEPENDENT_AMBULATORY_CARE_PROVIDER_SITE_OTHER): Payer: Medicaid Other | Admitting: Psychiatry

## 2019-04-06 ENCOUNTER — Encounter (HOSPITAL_COMMUNITY): Payer: Self-pay | Admitting: Psychiatry

## 2019-04-06 DIAGNOSIS — Z7289 Other problems related to lifestyle: Secondary | ICD-10-CM

## 2019-04-06 DIAGNOSIS — G4700 Insomnia, unspecified: Secondary | ICD-10-CM | POA: Diagnosis not present

## 2019-04-06 DIAGNOSIS — F331 Major depressive disorder, recurrent, moderate: Secondary | ICD-10-CM | POA: Diagnosis not present

## 2019-04-06 DIAGNOSIS — F419 Anxiety disorder, unspecified: Secondary | ICD-10-CM | POA: Diagnosis not present

## 2019-04-06 DIAGNOSIS — F1290 Cannabis use, unspecified, uncomplicated: Secondary | ICD-10-CM

## 2019-04-06 MED ORDER — ARIPIPRAZOLE 10 MG PO TABS: 10.0000 mg | ORAL_TABLET | Freq: Every day | ORAL | 1 refills | Status: DC

## 2019-04-06 NOTE — Patient Instructions (Signed)
1.ContinuePristiq200 mg daily - she will contact the clinic if she needs a refill 2.Decreasebupropion300 mg daily 3. IncreaseAbilify 10 mg at night 4. Next appointment: 9/22 at 2 PM

## 2019-04-09 ENCOUNTER — Other Ambulatory Visit: Payer: Self-pay

## 2019-04-09 ENCOUNTER — Ambulatory Visit (INDEPENDENT_AMBULATORY_CARE_PROVIDER_SITE_OTHER): Payer: Medicaid Other | Admitting: Licensed Clinical Social Worker

## 2019-04-09 ENCOUNTER — Encounter (HOSPITAL_COMMUNITY): Payer: Self-pay | Admitting: Licensed Clinical Social Worker

## 2019-04-09 DIAGNOSIS — F331 Major depressive disorder, recurrent, moderate: Secondary | ICD-10-CM

## 2019-04-09 NOTE — Progress Notes (Signed)
Virtual Visit via Video Note  I connected with Heidi [name] on 04/09/19 at 10:00 AM EDT by a video enabled telemedicine application and verified that I am speaking with the correct person using two identifiers.  Location: Patient: Home Provider: Office   I discussed the limitations of evaluation and management by telemedicine and the availability of in person appointments. The patient expressed understanding and agreed to proceed.   THERAPIST PROGRESS NOTE  Session Time: 4:00pm -4:40 pm  Participation Level: Active  Behavioral Response: CasualAlertDepressed  Type of Therapy: Individual Therapy  Treatment Goals addressed: Coping  Interventions: CBT and Solution Focused  Summary: Heidi [name] is a 28 y.o. female who presents oriented x5 (person, place, situation, time, and object), casually dressed, appropriately groomed, average heigh, overweight, and cooperative to address mood. Patient has a history of medical treatment including diabetes, GERD, and hypertension. Patient has a history of mental health treatment including outpatient therapy and medication management. Patient denies suicidal and homicidal ideations. Patient denies psychosis including auditory and visual hallucinations. Patient denies substance abuse. She is at low risk for lethality at this time.  Physically: Patient's sleep fluctuates. She sleeps from 6-12 hours a night. Patient feels like she has gained weight.  Spiritually/values: No issues identified.  Relationships: Patient feels resentful toward her father. He has been staying with her and she feels like the apartment they share is too small. She feels like she has to stay in her room because she doesn't want to talk to him because she doesn't know how to talk to him.   Emotionally/Mentally/Behavior:  Patient feels depressed and irritable. She has not been taking steps to clean her home. Patient feels like she has been irritable and "snaps." Patient said that she  will take deep breathes, leave the situation to cool down, and examine her thoughts related to anger.    Patient engaged in session. She responded well to interventions. Patient continues to meet criteria for MDD (major depressive disorder), recurrent episode, moderate. Patient will continue in outpatient therapy due to being the least restrictive service to meet her needs. Patient made minimal progress on her goals.    Suicidal/Homicidal: Negativewithout intent/plan  Therapist Response: Therapist reviewed patient's recent thoughts and behaviors. Therapist utilized CBT to address mood. Therapist processed patient's feelings to identify triggers for mood. Therapist discussed with patient examining her thoughts to manage her anger and depression.   Plan: Return again in 4 weeks.  Diagnosis: Axis I: MDD (major depressive disorder), recurrent episode, moderate    Axis II: No diagnosis   I discussed the assessment and treatment plan with the patient. The patient was provided an opportunity to ask questions and all were answered. The patient agreed with the plan and demonstrated an understanding of the instructions.   The patient was advised to call back or seek an in-person evaluation if the symptoms worsen or if the condition fails to improve as anticipated.  I provided 40 minutes of non-face-to-face time during this encounter.    Glori Bickers, LCSW 04/09/2019

## 2019-04-27 ENCOUNTER — Other Ambulatory Visit: Payer: Self-pay | Admitting: *Deleted

## 2019-04-27 DIAGNOSIS — K2100 Gastro-esophageal reflux disease with esophagitis, without bleeding: Secondary | ICD-10-CM

## 2019-04-27 NOTE — Telephone Encounter (Signed)
Received refill request for dexilant 60mg  1 cap po QD

## 2019-04-28 MED ORDER — DEXILANT 60 MG PO CPDR: DELAYED_RELEASE_CAPSULE | ORAL | 3 refills | Status: DC

## 2019-04-28 NOTE — Addendum Note (Signed)
Addended by: Gordy Levan, Alvina Strother A on: 04/28/2019 10:19 AM   Modules accepted: Orders

## 2019-04-28 NOTE — Telephone Encounter (Signed)
I can send in a limited refill. However we haven't seen her in about 2 years. Will need a f/u OV for further refills. Alternatively she could discuss ongoing Rx with PCP.

## 2019-05-04 ENCOUNTER — Telehealth: Payer: Self-pay | Admitting: Internal Medicine

## 2019-05-04 NOTE — Telephone Encounter (Signed)
Patient called and wants to know if her prior Heidi [name] has been done for her prescription of dexilant. She is out of them, and if insurance will not cover she needs to try something else.  Has been taking lots of tums  [phone]

## 2019-05-06 NOTE — Telephone Encounter (Signed)
Tried [employer]. Wasn't able to get through. Will keep trying to submit PA.

## 2019-05-10 ENCOUNTER — Other Ambulatory Visit: Payer: Self-pay | Admitting: Family Medicine

## 2019-05-10 DIAGNOSIS — E1159 Type 2 diabetes mellitus with other circulatory complications: Secondary | ICD-10-CM

## 2019-05-10 DIAGNOSIS — I10 Essential (primary) hypertension: Secondary | ICD-10-CM

## 2019-05-11 ENCOUNTER — Telehealth (HOSPITAL_COMMUNITY): Payer: Self-pay | Admitting: *Deleted

## 2019-05-11 ENCOUNTER — Other Ambulatory Visit (HOSPITAL_COMMUNITY): Payer: Self-pay | Admitting: Psychiatry

## 2019-05-11 MED ORDER — DESVENLAFAXINE SUCCINATE ER 100 MG PO TB24: 200.0000 mg | ORAL_TABLET | Freq: Every day | ORAL | 0 refills | Status: DC

## 2019-05-11 NOTE — Telephone Encounter (Signed)
Patient called stating that she is having withdraws due to that she waited to long to Request refill from Rx for   Pristiq:Take 2 tablets (200 mg total) by mouth daily.

## 2019-05-11 NOTE — Telephone Encounter (Signed)
Ordered refill

## 2019-05-13 ENCOUNTER — Encounter: Payer: Self-pay | Admitting: Family Medicine

## 2019-05-13 ENCOUNTER — Ambulatory Visit (INDEPENDENT_AMBULATORY_CARE_PROVIDER_SITE_OTHER): Payer: Medicaid Other | Admitting: Family Medicine

## 2019-05-13 DIAGNOSIS — I10 Essential (primary) hypertension: Secondary | ICD-10-CM

## 2019-05-13 DIAGNOSIS — E1159 Type 2 diabetes mellitus with other circulatory complications: Secondary | ICD-10-CM

## 2019-05-13 DIAGNOSIS — I152 Hypertension secondary to endocrine disorders: Secondary | ICD-10-CM

## 2019-05-13 MED ORDER — LISINOPRIL-HYDROCHLOROTHIAZIDE 20-12.5 MG PO TABS: 1.0000 | ORAL_TABLET | Freq: Every day | ORAL | 0 refills | Status: DC

## 2019-05-13 MED ORDER — AMLODIPINE BESYLATE 5 MG PO TABS: 5.0000 mg | ORAL_TABLET | Freq: Every day | ORAL | 0 refills | Status: DC

## 2019-05-13 MED ORDER — CYCLOBENZAPRINE HCL 10 MG PO TABS: 10.0000 mg | ORAL_TABLET | Freq: Three times a day (TID) | ORAL | 0 refills | Status: DC | PRN

## 2019-05-13 NOTE — Progress Notes (Signed)
Attempted to call patient 3 times with no answer

## 2019-05-14 NOTE — Telephone Encounter (Signed)
PA for Dexilant 60 mg was approved for 1 year. Left message for pt, pt notified of approval.

## 2019-05-24 NOTE — Progress Notes (Deleted)
BH MD/PA/NP OP Progress Note  05/24/2019 12:53 PM Heidi [name]  MRN:  [id number]  Chief Complaint:  HPI: *** Visit Diagnosis: No diagnosis found.  Past Psychiatric History: Please see initial evaluation for full details. I have reviewed the history. No updates at this time.     Past Medical History:  Past Medical History:  Diagnosis Date  . Abdominal pain 01/10/2016  . Anxiety   . Back pain   . Bloating 01/10/2016  . Depression   . GERD (gastroesophageal reflux disease)   . Hematuria 01/10/2016  . Herpes simplex virus (HSV) infection   . Hypertension   . Osteoarthritis   . Prediabetes   . Schizo-affective schizophrenia ([place])   . Type 2 diabetes mellitus ([place]) 03/28/2016  . Urinary frequency 01/10/2016    Past Surgical History:  Procedure Laterality Date  . BILATERAL SALPINGECTOMY Bilateral 06/24/2017   Procedure: BILATERAL SALPINGECTOMY;  Surgeon: Jonnie Kind, MD;  Location: [facility];  Service: Gynecology;  Laterality: Bilateral;  . CESAREAN SECTION    . COLONOSCOPY WITH PROPOFOL N/A 03/17/2017   Procedure: COLONOSCOPY WITH PROPOFOL;  Surgeon: Daneil Dolin, MD;  Location: [facility];  Service: Endoscopy;  Laterality: N/A;  9:30am  . DILATION AND CURETTAGE OF UTERUS    . ESOPHAGOGASTRODUODENOSCOPY (EGD) WITH PROPOFOL N/A 03/17/2017   Procedure: ESOPHAGOGASTRODUODENOSCOPY (EGD) WITH PROPOFOL;  Surgeon: Daneil Dolin, MD;  Location: [facility];  Service: Endoscopy;  Laterality: N/A;  . ESSURE TUBAL LIGATION    . SUPRACERVICAL ABDOMINAL HYSTERECTOMY N/A 06/24/2017   Procedure: SUPRACERVICAL ABDOMINAL HYSTERECTOMY, WIDE EXCISION OF CICATRIX;  Surgeon: Jonnie Kind, MD;  Location: [facility];  Service: Gynecology;  Laterality: N/A;    Family Psychiatric History: Please see initial evaluation for full details. I have reviewed the history. No updates at this time.     Family History:  Family History  Problem Relation Age of Onset  . Depression Mother   .  Hypertension Mother   . Diabetes Mother   . Mental illness Father   . Hypertension Father   . Diabetes Father   . Anxiety disorder Father   . Cancer Maternal Aunt   . Diabetes Paternal Grandmother   . Hypertension Paternal Grandmother   . Arthritis Paternal Grandmother   . Stroke Paternal Grandfather   . Heart disease Paternal Grandfather   . Hypertension Paternal Grandfather   . Diabetes Paternal Grandfather   . Schizophrenia Maternal Grandfather   . Alcohol abuse Maternal Grandfather   . Other Sister        recovering addict  . Schizophrenia Sister   . Irritable bowel syndrome Sister   . Drug abuse Sister   . Other Sister        recovering addict  . Bipolar disorder Sister   . Seizures Sister   . Drug abuse Sister   . Schizophrenia Sister   . Depression Paternal Aunt   . Anxiety disorder Paternal Aunt   . Colon cancer Neg Hx   . Inflammatory bowel disease Neg Hx   . Celiac disease Neg Hx     Social History:  Social History   Socioeconomic History  . Marital status: Legally Separated    Spouse name: Not on file  . Number of children: 1  . Years of education: HS  . Highest education level: Not on file  Occupational History  . Occupation: Unemployed  Social Needs  . Financial resource strain: Not on file  . Food insecurity    Worry:  Not on file    Inability: Not on file  . Transportation needs    Medical: Not on file    Non-medical: Not on file  Tobacco Use  . Smoking status: Current Every Day Smoker    Packs/day: 1.00    Years: 10.00    Pack years: 10.00    Types: Cigarettes  . Smokeless tobacco: Never Used  Substance and Sexual Activity  . Alcohol use: Yes    Alcohol/week: 0.0 standard drinks    Comment: Occasionally 7  . Drug use: Yes    Frequency: 4.0 times per week    Types: Marijuana    Comment: Occasionally  . Sexual activity: Yes    Partners: Male    Birth control/protection: Surgical    Comment: tubal sch  Lifestyle  . Physical activity     Days per week: Not on file    Minutes per session: Not on file  . Stress: Not on file  Relationships  . Social Musician on phone: Not on file    Gets together: Not on file    Attends religious service: Not on file    Active member of club or organization: Not on file    Attends meetings of clubs or organizations: Not on file    Relationship status: Not on file  Other Topics Concern  . Not on file  Social History Narrative   Lives at home with her boyfriend.   Right-handed.   Drinks approximately two 2-liter sodas per day.   Drinks 1 cup coffee per day.    Allergies:  Allergies  Allergen Reactions  . Hydroxyzine     Causes her more anxiety.  [name] [Linaclotide] Hives  . Lorazepam     Sedation  . Morphine And Related Other (See Comments)    Makes pt.aggressive  . Quetiapine     Sweating, irritability    Metabolic Disorder Labs: Lab Results  Component Value Date   HGBA1C 6.3 01/08/2019   MPG 220.21 06/20/2017   No results found for: PROLACTIN Lab Results  Component Value Date   CHOL 136 01/08/2019   TRIG 211 (H) 01/08/2019   HDL 28 (L) 01/08/2019   CHOLHDL 4.9 (H) 01/08/2019   LDLCALC 66 01/08/2019   LDLCALC 66 03/13/2018   Lab Results  Component Value Date   TSH 0.890 04/30/2018   TSH 1.930 09/26/2015    Therapeutic Level Labs: No results found for: LITHIUM No results found for: VALPROATE No components found for:  CBMZ  Current Medications: Current Outpatient Medications  Medication Sig Dispense Refill  . albuterol (PROVENTIL HFA;VENTOLIN HFA) 108 (90 Base) MCG/ACT inhaler Inhale 2 puffs into the lungs every 6 (six) hours as needed for wheezing or shortness of breath. 1 Inhaler 2  . ALPRAZolam (XANAX) 0.25 MG tablet Take 0.5-1 tablets (0.125-0.25 mg total) by mouth 2 (two) times daily as needed for anxiety (depends on anxiety if takes 05-0 tablet). (Patient not taking: Reported on 04/06/2019) 15 tablet 2  . amLODipine (NORVASC) 5 MG  tablet Take 1 tablet (5 mg total) by mouth daily. 90 tablet 0  . ARIPiprazole (ABILIFY) 10 MG tablet Take 1 tablet (10 mg total) by mouth daily. 30 tablet 1  . ARIPiprazole (ABILIFY) 5 MG tablet Take 1 tablet (5 mg total) by mouth daily. 90 tablet 0  . buPROPion (WELLBUTRIN XL) 300 MG 24 hr tablet Take 1 tablet (300 mg total) by mouth daily. 90 tablet 0  . cyclobenzaprine (FLEXERIL) 10  MG tablet Take 1 tablet (10 mg total) by mouth 3 (three) times daily as needed. for muscle spams 30 tablet 0  . desvenlafaxine (PRISTIQ) 100 MG 24 hr tablet Take 2 tablets (200 mg total) by mouth daily. 180 tablet 0  . dexlansoprazole (DEXILANT) 60 MG capsule TAKE 1 CAPSULE BY MOUTH ONCE DAILY (ENDOSCOPIC FAILURE ON NEXIUM. POOR CONTROL OF SYMPTOMS ON PANTOPRAZOLE) 30 capsule 3  . fluconazole (DIFLUCAN) 150 MG tablet Take 1 now and 1 in 3 days 2 tablet 1  . glucose blood (ACCU-CHEK GUIDE) test strip Use to check BG once daily at varying times of day. 100 each 3  . Lancets 30G MISC Dispense Accu-Chek guide lancets.  Check blood sugar at varying times once per day. 100 each 11  . lisinopril-hydrochlorothiazide (ZESTORETIC) 20-12.5 MG tablet Take 1 tablet by mouth daily. 90 tablet 0  . meloxicam (MOBIC) 7.5 MG tablet Take 2 tablets by mouth once daily 180 tablet 0  . metFORMIN (GLUCOPHAGE) 1000 MG tablet Take 1 tablet (1,000 mg total) by mouth 2 (two) times daily with a meal. 180 tablet 0  . traZODone (DESYREL) 50 MG tablet Take 1 tablet (50 mg total) by mouth at bedtime as needed for sleep. (Patient not taking: Reported on 04/06/2019) 30 tablet 2   No current facility-administered medications for this visit.      Musculoskeletal: Strength & Muscle Tone: N/A Gait & Station: N/A Patient leans: N/A  Psychiatric Specialty Exam: ROS  Last menstrual period 05/28/2017.There is no height or weight on file to calculate BMI.  General Appearance: {Appearance:22683}  Eye Contact:  {B[facility] EYE CONTACT:22684}  Speech:  Clear  and Coherent  Volume:  Normal  Mood:  {BHH MOOD:22306}  Affect:  {Affect (PAA):22687}  Thought Process:  Coherent  Orientation:  Full (Time, Place, and Person)  Thought Content: Logical   Suicidal Thoughts:  {ST/HT (PAA):22692}  Homicidal Thoughts:  {ST/HT (PAA):22692}  Memory:  Immediate;   Good  Judgement:  {Judgement (PAA):22694}  Insight:  {Insight (PAA):22695}  Psychomotor Activity:  Normal  Concentration:  Concentration: Good and Attention Span: Good  Recall:  Good  Fund of Knowledge: Good  Language: Good  Akathisia:  No  Handed:  Right  AIMS (if indicated): not done  Assets:  Communication Skills Desire for Improvement  ADL's:  Intact  Cognition: WNL  Sleep:  {B[facility] GOOD/FAIR/POOR:22877}   Screenings: GAD-7     Virtual BH Phone Follow Up from 10/15/2017 in [facility]  Total GAD-7 Score  0    PHQ2-9     Office Visit from 10/12/2018 in [facility] Office Visit from 08/24/2018 in [facility] Office Visit from 04/23/2018 in [facility] Office Visit from 04/15/2018 in [facility] Office Visit from 03/13/2018 in [facility]  PHQ-2 Total Score  2  2  2  2  2   PHQ-9 Total Score  9  9  6  9  9        Assessment and Plan:  Heidi [name] is a 28 y.o. year old female with a history of depression, type II diabetes,hypertension, status post supracervical hysterectomywith bilateral salpingectomy, who presents for follow up appointment for No diagnosis found.  # MDD, moderate, recurrent without psychotic features # r/o PTSD   Patient reports worsening in irritability since the last visit.  Psychosocial stressors includes loss of her sister from overdose. She also does have a feeling of abandonment which she attributes to  prior relationships and her mother.  Will uptitrate Abilify as adjunctive treatment for depression.  Discussed potential  metabolic side effect.  Will taper down bupropion given patient reports limited benefit from higher dose.  Will continue Pristiq to target depression and anxiety.  She does have cluster B traits, which she will greatly benefit from therapy; she is encouraged to continue to see Mr. sheets for therapy.   # Alcohol use  # Marijuana use  She has occasional alcohol use and uses marijuana every day.  Will continue motivational interview.   Plan  1.ContinuePristiq200 mg daily - she will contact the clinic if she needs a refill 2.Decreasebupropion300 mg daily(limited benefit from higher dose) 3. IncreaseAbilify 10 mg at night 4. Next appointment: 9/22 at 2 PM for 30 mins, video   The patient demonstrates the following risk factors for suicide: Chronic risk factors for suicide include:psychiatric disorder ofdepression, substance use disorder and previous self-harmof banging her head. Acute risk factorsfor suicide include: N/A. Protective factorsfor this patient include: positive social support and hope for the future. Considering these factors, the overall suicide risk at this point appears to bechronically elevated, but not at imminent risk. She is future oriented and is amenable to treatment plan.Patient isappropriate for outpatient follow up. Emergency resources which includes 911, ED, suicide crisis line [phone]) are discussed.She denies gun access at home  Neysa Hottereina Jestin Burbach, MD 05/24/2019, 12:53 PM

## 2019-05-25 ENCOUNTER — Ambulatory Visit (HOSPITAL_COMMUNITY): Payer: Medicaid Other | Admitting: Psychiatry

## 2019-05-25 ENCOUNTER — Other Ambulatory Visit: Payer: Self-pay

## 2019-05-25 ENCOUNTER — Telehealth (HOSPITAL_COMMUNITY): Payer: Self-pay | Admitting: Psychiatry

## 2019-05-25 NOTE — Telephone Encounter (Signed)
Sent link for video visit through [facility]. Patient did not sign in. Called the patient  twice for appointment scheduled today. The patient did not answer the phone. Left voice message to contact the office.  

## 2019-06-03 ENCOUNTER — Encounter: Payer: Self-pay | Admitting: Family Medicine

## 2019-06-03 ENCOUNTER — Ambulatory Visit: Payer: Medicaid Other | Admitting: Family Medicine

## 2019-06-03 ENCOUNTER — Other Ambulatory Visit: Payer: Self-pay

## 2019-06-03 VITALS — BP 128/89 | HR 82 | Temp 99.0°F | Ht 67.0 in | Wt 307.4 lb

## 2019-06-03 DIAGNOSIS — N760 Acute vaginitis: Secondary | ICD-10-CM | POA: Diagnosis not present

## 2019-06-03 DIAGNOSIS — M1712 Unilateral primary osteoarthritis, left knee: Secondary | ICD-10-CM

## 2019-06-03 DIAGNOSIS — R81 Glycosuria: Secondary | ICD-10-CM | POA: Diagnosis not present

## 2019-06-03 DIAGNOSIS — M1711 Unilateral primary osteoarthritis, right knee: Secondary | ICD-10-CM | POA: Diagnosis not present

## 2019-06-03 DIAGNOSIS — B9689 Other specified bacterial agents as the cause of diseases classified elsewhere: Secondary | ICD-10-CM

## 2019-06-03 LAB — MICROSCOPIC EXAMINATION
Epithelial Cells (non renal): >10 /hpf — AB (ref 0–10)
Renal Epithel, UA: NONE SEEN /hpf

## 2019-06-03 LAB — URINALYSIS, COMPLETE
Bilirubin, UA: NEGATIVE
Ketones, UA: NEGATIVE
Leukocytes,UA: NEGATIVE
Nitrite, UA: NEGATIVE
RBC, UA: NEGATIVE
Specific Gravity, UA: 1.025 (ref 1.005–1.030)
Urobilinogen, Ur: 0.2 mg/dL (ref 0.2–1.0)
pH, UA: 6.5 (ref 5.0–7.5)

## 2019-06-03 LAB — WET PREP FOR TRICH, YEAST, CLUE
Clue Cell Exam: POSITIVE — AB
Trichomonas Exam: NEGATIVE
Yeast Exam: NEGATIVE

## 2019-06-03 LAB — BAYER DCA HB A1C WAIVED: HB A1C (BAYER DCA - WAIVED): 9.6 % — ABNORMAL HIGH (ref <7.0)

## 2019-06-03 MED ORDER — FLUCONAZOLE 150 MG PO TABS: 150.0000 mg | ORAL_TABLET | Freq: Once | ORAL | 0 refills | Status: AC

## 2019-06-03 MED ORDER — METRONIDAZOLE 500 MG PO TABS: 500.0000 mg | ORAL_TABLET | Freq: Two times a day (BID) | ORAL | 0 refills | Status: DC

## 2019-06-03 MED ORDER — METHYLPREDNISOLONE ACETATE 80 MG/ML IJ SUSP
80.0000 mg | Freq: Once | INTRAMUSCULAR | Status: AC
  Administered 2019-06-03: 80 mg via INTRAMUSCULAR

## 2019-06-03 NOTE — Progress Notes (Signed)
BP 128/89   Pulse 82   Temp 99 F (37.2 C) (Temporal)   Ht '5\' 7"'  (1.702 m)   Wt (!) 307 lb 6.4 oz (139.4 kg)   LMP 05/28/2017 Comment: [place]  SpO2 99%   BMI 48.15 kg/m    Subjective:   Patient ID: Heidi [name], female    DOB: [birth year], 28 y.o.   MRN: [id number]  HPI: Heidi [name] is a 28 y.o. female presenting on 06/03/2019 for vaginal discomfort (x 2-3 weeks) and knee injections (bilateral )   HPI Patient comes in complaining of bilateral knee pain and arthritis which she has been dealing with for some time, she does have a referral to endocrinology already in place.  She is coming in today for repeat injections.  Patient comes in complaining of vaginal discharge and irritation discomfort with intercourse that has been bothering her over over the past week.  She does complain of some urinary frequency as well.  Relevant past medical, surgical, family and social history reviewed and updated as indicated. Interim medical history since our last visit reviewed. Allergies and medications reviewed and updated.  Review of Systems  Constitutional: Negative for chills and fever.  Eyes: Negative for redness and visual disturbance.  Respiratory: Negative for chest tightness and shortness of breath.   Cardiovascular: Negative for chest pain and leg swelling.  Gastrointestinal: Negative for abdominal pain.  Genitourinary: Positive for vaginal discharge and vaginal pain. Negative for decreased urine volume, difficulty urinating, dysuria, flank pain, urgency and vaginal bleeding.  Musculoskeletal: Negative for back pain and gait problem.  Skin: Negative for rash.  Neurological: Negative for light-headedness and headaches.  Psychiatric/Behavioral: Negative for agitation and behavioral problems.  All other systems reviewed and are negative.   Per HPI unless specifically indicated above   Allergies as of 06/03/2019      Reactions   Hydroxyzine    Causes her more anxiety.   Linzess  [linaclotide] Hives   Lorazepam    Sedation   Morphine And Related Other (See Comments)   Makes pt.aggressive   Quetiapine    Sweating, irritability      Medication List       Accurate as of June 03, 2019 11:59 PM. If you have any questions, ask your nurse or doctor.        albuterol 108 (90 Base) MCG/ACT inhaler Commonly known as: VENTOLIN HFA Inhale 2 puffs into the lungs every 6 (six) hours as needed for wheezing or shortness of breath.   ALPRAZolam 0.25 MG tablet Commonly known as: XANAX Take 0.5-1 tablets (0.125-0.25 mg total) by mouth 2 (two) times daily as needed for anxiety (depends on anxiety if takes 05-0 tablet).   amLODipine 5 MG tablet Commonly known as: NORVASC Take 1 tablet (5 mg total) by mouth daily.   ARIPiprazole 10 MG tablet Commonly known as: ABILIFY Take 1 tablet (10 mg total) by mouth daily. What changed: Another medication with the same name was removed. Continue taking this medication, and follow the directions you see here. Changed by: Fransisca Kaufmann Dettinger, MD   buPROPion 300 MG 24 hr tablet Commonly known as: WELLBUTRIN XL Take 1 tablet (300 mg total) by mouth daily.   cyclobenzaprine 10 MG tablet Commonly known as: FLEXERIL Take 1 tablet (10 mg total) by mouth 3 (three) times daily as needed. for muscle spams   desvenlafaxine 100 MG 24 hr tablet Commonly known as: PRISTIQ Take 2 tablets (200 mg total) by mouth daily.   Dexilant 60  MG capsule Generic drug: dexlansoprazole TAKE 1 CAPSULE BY MOUTH ONCE DAILY (ENDOSCOPIC FAILURE ON NEXIUM. POOR CONTROL OF SYMPTOMS ON PANTOPRAZOLE)   fluconazole 150 MG tablet Commonly known as: Diflucan Take 1 tablet (150 mg total) by mouth once for 1 dose. What changed:   how much to take  how to take this  when to take this  additional instructions Changed by: Fransisca Kaufmann Dettinger, MD   glucose blood test strip Commonly known as: Accu-Chek Guide Use to check BG once daily at varying times of  day.   Lancets 30G Misc Dispense Accu-Chek guide lancets.  Check blood sugar at varying times once per day.   lisinopril-hydrochlorothiazide 20-12.5 MG tablet Commonly known as: ZESTORETIC Take 1 tablet by mouth daily.   meloxicam 7.5 MG tablet Commonly known as: MOBIC Take 2 tablets by mouth once daily   metFORMIN 1000 MG tablet Commonly known as: GLUCOPHAGE Take 1 tablet (1,000 mg total) by mouth 2 (two) times daily with a meal.   metroNIDAZOLE 500 MG tablet Commonly known as: Flagyl Take 1 tablet (500 mg total) by mouth 2 (two) times daily. Started by: Worthy Rancher, MD   traZODone 50 MG tablet Commonly known as: DESYREL Take 1 tablet (50 mg total) by mouth at bedtime as needed for sleep.        Objective:   BP 128/89   Pulse 82   Temp 99 F (37.2 C) (Temporal)   Ht '5\' 7"'  (1.702 m)   Wt (!) 307 lb 6.4 oz (139.4 kg)   LMP 05/28/2017 Comment: [place]  SpO2 99%   BMI 48.15 kg/m   Wt Readings from Last 3 Encounters:  06/03/19 (!) 307 lb 6.4 oz (139.4 kg)  01/08/19 284 lb 9.6 oz (129.1 kg)  10/12/18 281 lb (127.5 kg)    Physical Exam Vitals signs and nursing note reviewed.  Constitutional:      General: She is not in acute distress.    Appearance: She is well-developed. She is not diaphoretic.  Eyes:     Conjunctiva/sclera: Conjunctivae normal.  Abdominal:     General: Abdomen is flat. Bowel sounds are normal. There is no distension.     Tenderness: There is no abdominal tenderness. There is no guarding or rebound.  Genitourinary:    Comments: Patient declined exam today Musculoskeletal:        General: Tenderness (Tenderness in bilateral knees both medial and lateral on the joint lines.  Slight effusion in both knees, no erythema) present.  Skin:    General: Skin is warm and dry.     Findings: No rash.  Neurological:     Mental Status: She is alert and oriented to person, place, and time.     Coordination: Coordination normal.  Psychiatric:         Behavior: Behavior normal.     Wet prep; positive clue cells, urinalysis 2+ glucose trace protein, 0-5 WBCs, 0-2 RBCs, greater than 10 epithelial cells, many bacteria  Knee injection bilateral: Consent form signed. Risk factors of bleeding and infection discussed with patient and patient is agreeable towards injection. Patient prepped with Betadine. Lateral approach towards injection used. Injected 80 mg of Depo-Medrol and 1 mL of 2% lidocaine. Patient tolerated procedure well and no side effects from noted. Minimal to no bleeding. Simple bandage applied after.   Assessment & Plan:   Problem List Items Addressed This Visit    None    Visit Diagnoses    Bacterial vaginosis    -  Primary   Relevant Medications   metroNIDAZOLE (FLAGYL) 500 MG tablet   Other Relevant Orders   Urinalysis, Complete (Completed)   WET PREP FOR [place], YEAST, CLUE (Completed)   Osteoarthritis of right knee, unspecified osteoarthritis type       Relevant Medications   methylPREDNISolone acetate (DEPO-MEDROL) injection 80 mg (Completed)   methylPREDNISolone acetate (DEPO-MEDROL) injection 80 mg (Completed)   Osteoarthritis of left knee, unspecified osteoarthritis type       Relevant Medications   methylPREDNISolone acetate (DEPO-MEDROL) injection 80 mg (Completed)   methylPREDNISolone acetate (DEPO-MEDROL) injection 80 mg (Completed)   Glucosuria       Relevant Orders   Bayer DCA Hb A1c Waived (Completed)   BMP8+EGFR (Completed)      Patient had glucose urea 2+, will check A1c and chemistry panel, she has not been checking her sugars recently.  We will treat BV and give her Diflucan for after the antibiotic.  Gave steroid injections of both knees.  Follow up plan: Return in about 4 months (around 10/04/2019), or if symptoms worsen or fail to improve, for Osteoarthritis and diabetes.  Counseling provided for all of the vaccine components Orders Placed This Encounter  Procedures  . WET PREP FOR [place],  YEAST, CLUE  . Microscopic Examination  . Urinalysis, Complete  . Bayer DCA Hb A1c Waived  . BMP8+EGFR    Caryl Pina, MD [facility] 06/09/2019, 10:51 PM

## 2019-06-04 LAB — BMP8+EGFR
BUN/Creatinine Ratio: 9 (ref 9–23)
BUN: 8 mg/dL (ref 6–20)
CO2: 24 mmol/L (ref 20–29)
Calcium: 9.7 mg/dL (ref 8.7–10.2)
Chloride: 99 mmol/L (ref 96–106)
Creatinine, Ser: 0.86 mg/dL (ref 0.57–1.00)
GFR calc Af Amer: 106 mL/min/{1.73_m2} (ref >59)
GFR calc non Af Amer: 92 mL/min/{1.73_m2} (ref >59)
Glucose: 256 mg/dL — ABNORMAL HIGH (ref 65–99)
Potassium: 4.4 mmol/L (ref 3.5–5.2)
Sodium: 137 mmol/L (ref 134–144)

## 2019-06-17 NOTE — Progress Notes (Deleted)
BH MD/PA/NP OP Progress Note  06/17/2019 4:07 PM Heidi [name]  MRN:  [id number]  Chief Complaint:  HPI: *** Visit Diagnosis: No diagnosis found.  Past Psychiatric History: Please see initial evaluation for full details. I have reviewed the history. No updates at this time.     Past Medical History:  Past Medical History:  Diagnosis Date  . Abdominal pain 01/10/2016  . Anxiety   . Back pain   . Bloating 01/10/2016  . Depression   . GERD (gastroesophageal reflux disease)   . Hematuria 01/10/2016  . Herpes simplex virus (HSV) infection   . Hypertension   . Osteoarthritis   . Prediabetes   . Schizo-affective schizophrenia (HCC)   . Type 2 diabetes mellitus (HCC) 03/28/2016  . Urinary frequency 01/10/2016    Past Surgical History:  Procedure Laterality Date  . BILATERAL SALPINGECTOMY Bilateral 06/24/2017   Procedure: BILATERAL SALPINGECTOMY;  Surgeon: Tilda BurrowFerguson, John V, MD;  Location: [facility];  Service: Gynecology;  Laterality: Bilateral;  . CESAREAN SECTION    . COLONOSCOPY WITH PROPOFOL N/A 03/17/2017   Procedure: COLONOSCOPY WITH PROPOFOL;  Surgeon: Corbin Adeourk, Robert M, MD;  Location: [facility];  Service: Endoscopy;  Laterality: N/A;  9:30am  . DILATION AND CURETTAGE OF UTERUS    . ESOPHAGOGASTRODUODENOSCOPY (EGD) WITH PROPOFOL N/A 03/17/2017   Procedure: ESOPHAGOGASTRODUODENOSCOPY (EGD) WITH PROPOFOL;  Surgeon: Corbin Adeourk, Robert M, MD;  Location: [facility];  Service: Endoscopy;  Laterality: N/A;  . ESSURE TUBAL LIGATION    . SUPRACERVICAL ABDOMINAL HYSTERECTOMY N/A 06/24/2017   Procedure: SUPRACERVICAL ABDOMINAL HYSTERECTOMY, WIDE EXCISION OF CICATRIX;  Surgeon: Tilda BurrowFerguson, John V, MD;  Location: [facility];  Service: Gynecology;  Laterality: N/A;    Family Psychiatric History: Please see initial evaluation for full details. I have reviewed the history. No updates at this time.     Family History:  Family History  Problem Relation Age of Onset  . Depression Mother   .  Hypertension Mother   . Diabetes Mother   . Mental illness Father   . Hypertension Father   . Diabetes Father   . Anxiety disorder Father   . Cancer Maternal Aunt   . Diabetes Paternal Grandmother   . Hypertension Paternal Grandmother   . Arthritis Paternal Grandmother   . Stroke Paternal Grandfather   . Heart disease Paternal Grandfather   . Hypertension Paternal Grandfather   . Diabetes Paternal Grandfather   . Schizophrenia Maternal Grandfather   . Alcohol abuse Maternal Grandfather   . Other Sister        recovering addict  . Schizophrenia Sister   . Irritable bowel syndrome Sister   . Drug abuse Sister   . Other Sister        recovering addict  . Bipolar disorder Sister   . Seizures Sister   . Drug abuse Sister   . Schizophrenia Sister   . Depression Paternal Aunt   . Anxiety disorder Paternal Aunt   . Colon cancer Neg Hx   . Inflammatory bowel disease Neg Hx   . Celiac disease Neg Hx     Social History:  Social History   Socioeconomic History  . Marital status: Legally Separated    Spouse name: Not on file  . Number of children: 1  . Years of education: HS  . Highest education level: Not on file  Occupational History  . Occupation: Unemployed  Social Needs  . Financial resource strain: Not on file  . Food insecurity    Worry:  Not on file    Inability: Not on file  . Transportation needs    Medical: Not on file    Non-medical: Not on file  Tobacco Use  . Smoking status: Current Every Day Smoker    Packs/day: 1.00    Years: 10.00    Pack years: 10.00    Types: Cigarettes  . Smokeless tobacco: Never Used  Substance and Sexual Activity  . Alcohol use: Yes    Alcohol/week: 0.0 standard drinks    Comment: Occasionally 7  . Drug use: Yes    Frequency: 4.0 times per week    Types: Marijuana    Comment: Occasionally  . Sexual activity: Yes    Partners: Male    Birth control/protection: Surgical    Comment: tubal sch  Lifestyle  . Physical activity     Days per week: Not on file    Minutes per session: Not on file  . Stress: Not on file  Relationships  . Social Musician on phone: Not on file    Gets together: Not on file    Attends religious service: Not on file    Active member of club or organization: Not on file    Attends meetings of clubs or organizations: Not on file    Relationship status: Not on file  Other Topics Concern  . Not on file  Social History Narrative   Lives at home with her boyfriend.   Right-handed.   Drinks approximately two 2-liter sodas per day.   Drinks 1 cup coffee per day.    Allergies:  Allergies  Allergen Reactions  . Hydroxyzine     Causes her more anxiety.  [name] [Linaclotide] Hives  . Lorazepam     Sedation  . Morphine And Related Other (See Comments)    Makes pt.aggressive  . Quetiapine     Sweating, irritability    Metabolic Disorder Labs: Lab Results  Component Value Date   HGBA1C 9.6 (H) 06/03/2019   MPG 220.21 06/20/2017   No results found for: PROLACTIN Lab Results  Component Value Date   CHOL 136 01/08/2019   TRIG 211 (H) 01/08/2019   HDL 28 (L) 01/08/2019   CHOLHDL 4.9 (H) 01/08/2019   LDLCALC 66 01/08/2019   LDLCALC 66 03/13/2018   Lab Results  Component Value Date   TSH 0.890 04/30/2018   TSH 1.930 09/26/2015    Therapeutic Level Labs: No results found for: LITHIUM No results found for: VALPROATE No components found for:  CBMZ  Current Medications: Current Outpatient Medications  Medication Sig Dispense Refill  . albuterol (PROVENTIL HFA;VENTOLIN HFA) 108 (90 Base) MCG/ACT inhaler Inhale 2 puffs into the lungs every 6 (six) hours as needed for wheezing or shortness of breath. 1 Inhaler 2  . ALPRAZolam (XANAX) 0.25 MG tablet Take 0.5-1 tablets (0.125-0.25 mg total) by mouth 2 (two) times daily as needed for anxiety (depends on anxiety if takes 05-0 tablet). 15 tablet 2  . amLODipine (NORVASC) 5 MG tablet Take 1 tablet (5 mg total) by  mouth daily. 90 tablet 0  . ARIPiprazole (ABILIFY) 10 MG tablet Take 1 tablet (10 mg total) by mouth daily. 30 tablet 1  . buPROPion (WELLBUTRIN XL) 300 MG 24 hr tablet Take 1 tablet (300 mg total) by mouth daily. 90 tablet 0  . cyclobenzaprine (FLEXERIL) 10 MG tablet Take 1 tablet (10 mg total) by mouth 3 (three) times daily as needed. for muscle spams 30 tablet 0  .  desvenlafaxine (PRISTIQ) 100 MG 24 hr tablet Take 2 tablets (200 mg total) by mouth daily. 180 tablet 0  . dexlansoprazole (DEXILANT) 60 MG capsule TAKE 1 CAPSULE BY MOUTH ONCE DAILY (ENDOSCOPIC FAILURE ON NEXIUM. POOR CONTROL OF SYMPTOMS ON PANTOPRAZOLE) 30 capsule 3  . glucose blood (ACCU-CHEK GUIDE) test strip Use to check BG once daily at varying times of day. 100 each 3  . Lancets 30G MISC Dispense Accu-Chek guide lancets.  Check blood sugar at varying times once per day. 100 each 11  . lisinopril-hydrochlorothiazide (ZESTORETIC) 20-12.5 MG tablet Take 1 tablet by mouth daily. 90 tablet 0  . meloxicam (MOBIC) 7.5 MG tablet Take 2 tablets by mouth once daily 180 tablet 0  . metFORMIN (GLUCOPHAGE) 1000 MG tablet Take 1 tablet (1,000 mg total) by mouth 2 (two) times daily with a meal. 180 tablet 0  . metroNIDAZOLE (FLAGYL) 500 MG tablet Take 1 tablet (500 mg total) by mouth 2 (two) times daily. 14 tablet 0  . traZODone (DESYREL) 50 MG tablet Take 1 tablet (50 mg total) by mouth at bedtime as needed for sleep. 30 tablet 2   No current facility-administered medications for this visit.      Musculoskeletal: Strength & Muscle Tone: N/A Gait & Station: N/A Patient leans: N/A  Psychiatric Specialty Exam: ROS  Last menstrual period 05/28/2017.There is no height or weight on file to calculate BMI.  General Appearance: {Appearance:22683}  Eye Contact:  {[facility] EYE CONTACT:22684}  Speech:  Clear and Coherent  Volume:  Normal  Mood:  {[facility] MOOD:22306}  Affect:  {Affect (PAA):22687}  Thought Process:  Coherent  Orientation:  Full  (Time, Place, and Person)  Thought Content: Logical   Suicidal Thoughts:  {ST/HT (PAA):22692}  Homicidal Thoughts:  {ST/HT (PAA):22692}  Memory:  Immediate;   Good  Judgement:  {Judgement (PAA):22694}  Insight:  {Insight (PAA):22695}  Psychomotor Activity:  Normal  Concentration:  Concentration: Good and Attention Span: Good  Recall:  Good  Fund of Knowledge: Good  Language: Good  Akathisia:  No  Handed:  Right  AIMS (if indicated): not done  Assets:  Communication Skills Desire for Improvement  ADL's:  Intact  Cognition: WNL  Sleep:  {[facility] GOOD/FAIR/POOR:22877}   Screenings: GAD-7     Virtual [facility] Phone Follow Up from 10/15/2017 in [place]  Total GAD-7 Score  0    PHQ2-9     Office Visit from 06/03/2019 in [place] Visit from 10/12/2018 in [place] Visit from 08/24/2018 in [place] Office Visit from 04/23/2018 in [place] Visit from 04/15/2018 in [place]  PHQ-2 Total Score  0  2  2  2  2   PHQ-9 Total Score  -  9  9  6  9        Assessment and Plan:  Taylor Spilde is a 28 y.o. year old female with a history of depression, type II diabetes,hypertension, status post supracervical hysterectomywith bilateral salpingectomy, who presents for follow up appointment for No diagnosis found.  # MDD, moderate, recurrent without psychotic features # r/o PTSD  Patient reports worsening in irritability since the last visit.  Psychosocial stressors includes loss of her sister from overdose. She also does have a feeling of abandonment which she attributes to prior relationships and her mother.  Will uptitrate Abilify as adjunctive treatment for depression.  Discussed potential metabolic side effect.  Will taper down bupropion given patient  reports limited benefit from higher dose.  Will continue Pristiq to target  depression and anxiety.  She does have cluster B traits, which she will greatly benefit from therapy; she is encouraged to continue to see Mr. sheets for therapy.   # Alcohol use # Marijuana use  She has occasional alcohol use and uses marijuana every day.  Will continue motivational interview.   Plan  1.ContinuePristiq200 mg daily - she will contact the clinic if she needs a refill 2.Decreasebupropion300 mg daily(limited benefit from higher dose) 3. IncreaseAbilify 10 mg at night 4. Next appointment: 9/22 at 2 PM for 30 mins, video  I have reviewed suicide assessment in detail. No change in the following assessment.   The patient demonstrates the following risk factors for suicide: Chronic risk factors for suicide include:psychiatric disorder ofdepression, substance use disorder and previous self-harmof banging her head. Acute risk factorsfor suicide include: N/A. Protective factorsfor this patient include: positive social support and hope for the future. Considering these factors, the overall suicide risk at this point appears to bechronically elevated, but not at imminent risk. She is future oriented and is amenable to treatment plan.Patient isappropriate for outpatient follow up. Emergency resources which includes 911, ED, suicide crisis line [phone]) are discussed.She denies gun access at home  [name], MD 06/17/2019, 4:07 PM

## 2019-06-24 ENCOUNTER — Ambulatory Visit (HOSPITAL_COMMUNITY): Payer: Medicaid Other | Admitting: Psychiatry

## 2019-06-24 ENCOUNTER — Other Ambulatory Visit: Payer: Self-pay | Admitting: Family Medicine

## 2019-06-24 ENCOUNTER — Other Ambulatory Visit: Payer: Self-pay

## 2019-06-24 ENCOUNTER — Telehealth (HOSPITAL_COMMUNITY): Payer: Self-pay | Admitting: Psychiatry

## 2019-06-24 DIAGNOSIS — M545 Low back pain: Secondary | ICD-10-CM

## 2019-06-24 DIAGNOSIS — G8929 Other chronic pain: Secondary | ICD-10-CM

## 2019-06-24 NOTE — Telephone Encounter (Signed)
Sent link for video visit through [facility]. Patient did not sign in. Called the patient  twice for appointment scheduled today. The patient did not answer the phone. Left voice message to contact the office.  

## 2019-06-25 NOTE — Progress Notes (Signed)
Virtual Visit via Video Note  I connected with Heidi [name] on 06/28/19 at  8:00 AM EDT by a video enabled telemedicine application and verified that I am speaking with the correct person using two identifiers.   I discussed the limitations of evaluation and management by telemedicine and the availability of in person appointments. The patient expressed understanding and agreed to proceed.     I discussed the assessment and treatment plan with the patient. The patient was provided an opportunity to ask questions and all were answered. The patient agreed with the plan and demonstrated an understanding of the instructions.   The patient was advised to call back or seek an in-person evaluation if the symptoms worsen or if the condition fails to improve as anticipated.  I provided 25 minutes of non-face-to-face time during this encounter.   Heidi Clay, MD     [facility] MD/PA/NP OP Progress Note  06/28/2019 8:29 AM Heidi [name]  MRN:  [id number]  Chief Complaint:  Chief Complaint    Follow-up; Depression     HPI:  This is a follow-up appointment for depression.  She states that she is not doing well.  She has been feeling depressed, fatigued, having increased appetite, and has hypersomnia.  She states that it is "fuxxx everything. I can't I can't" when she is inquired of any recent stress. The patient, the father of her son and the patient's mother alternately takes care of her son. She believes she has been taking good care of him when he stays with her. She reports good relationship with her boyfriend. She states that she had two "emotional breakdown" and missed going to work. She had crying spells at that time. She wants to come off Prestiq as she does not think it is working, and also complains of withdrawal symptoms when she does not take it at certain time. She has hypersomnia. She has anhedonia.  She has difficulty in concentration.  She reports passive SI, although she denies any  intent or plan.  She feels anxious and tense.  She feels irritable. She drank five of mixed drinks last Saturday. (the prior alcohol use was a few months ago). She uses marijuana every day to feel calmer.  306 lbs Wt Readings from Last 3 Encounters:  06/03/19 (!) 307 lb 6.4 oz (139.4 kg)  01/08/19 284 lb 9.6 oz (129.1 kg)  11/06/18 281 lb (127.5 kg)    Visit Diagnosis:    ICD-10-CM   1. MDD (major depressive disorder), recurrent episode, moderate ([place])  F33.1     Past Psychiatric History: Please see initial evaluation for full details. I have reviewed the history. No updates at this time.     Past Medical History:  Past Medical History:  Diagnosis Date  . Abdominal pain 01/10/2016  . Anxiety   . Back pain   . Bloating 01/10/2016  . Depression   . GERD (gastroesophageal reflux disease)   . Hematuria 01/10/2016  . Herpes simplex virus (HSV) infection   . Hypertension   . Osteoarthritis   . Prediabetes   . Schizo-affective schizophrenia ([place])   . Type 2 diabetes mellitus ([place]) 03/28/2016  . Urinary frequency 01/10/2016    Past Surgical History:  Procedure Laterality Date  . BILATERAL SALPINGECTOMY Bilateral 06/24/2017   Procedure: BILATERAL SALPINGECTOMY;  Surgeon: Jonnie Kind, MD;  Location: [facility];  Service: Gynecology;  Laterality: Bilateral;  . CESAREAN SECTION    . COLONOSCOPY WITH PROPOFOL N/A 03/17/2017   Procedure: COLONOSCOPY WITH PROPOFOL;  Surgeon: Corbin Ade, MD;  Location: [facility];  Service: Endoscopy;  Laterality: N/A;  9:30am  . DILATION AND CURETTAGE OF UTERUS    . ESOPHAGOGASTRODUODENOSCOPY (EGD) WITH PROPOFOL N/A 03/17/2017   Procedure: ESOPHAGOGASTRODUODENOSCOPY (EGD) WITH PROPOFOL;  Surgeon: Corbin Ade, MD;  Location: [facility];  Service: Endoscopy;  Laterality: N/A;  . ESSURE TUBAL LIGATION    . SUPRACERVICAL ABDOMINAL HYSTERECTOMY N/A 06/24/2017   Procedure: SUPRACERVICAL ABDOMINAL HYSTERECTOMY, WIDE EXCISION OF CICATRIX;  Surgeon:  Tilda Burrow, MD;  Location: [facility];  Service: Gynecology;  Laterality: N/A;    Family Psychiatric History: Please see initial evaluation for full details. I have reviewed the history. No updates at this time.     Family History:  Family History  Problem Relation Age of Onset  . Depression Mother   . Hypertension Mother   . Diabetes Mother   . Mental illness Father   . Hypertension Father   . Diabetes Father   . Anxiety disorder Father   . Cancer Maternal Aunt   . Diabetes Paternal Grandmother   . Hypertension Paternal Grandmother   . Arthritis Paternal Grandmother   . Stroke Paternal Grandfather   . Heart disease Paternal Grandfather   . Hypertension Paternal Grandfather   . Diabetes Paternal Grandfather   . Schizophrenia Maternal Grandfather   . Alcohol abuse Maternal Grandfather   . Other Sister        recovering addict  . Schizophrenia Sister   . Irritable bowel syndrome Sister   . Drug abuse Sister   . Other Sister        recovering addict  . Bipolar disorder Sister   . Seizures Sister   . Drug abuse Sister   . Schizophrenia Sister   . Depression Paternal Aunt   . Anxiety disorder Paternal Aunt   . Colon cancer Neg Hx   . Inflammatory bowel disease Neg Hx   . Celiac disease Neg Hx     Social History:  Social History   Socioeconomic History  . Marital status: Legally Separated    Spouse name: Not on file  . Number of children: 1  . Years of education: HS  . Highest education level: Not on file  Occupational History  . Occupation: Unemployed  Social Needs  . Financial resource strain: Not on file  . Food insecurity    Worry: Not on file    Inability: Not on file  . Transportation needs    Medical: Not on file    Non-medical: Not on file  Tobacco Use  . Smoking status: Current Every Day Smoker    Packs/day: 1.00    Years: 10.00    Pack years: 10.00    Types: Cigarettes  . Smokeless tobacco: Never Used  Substance and Sexual Activity  .  Alcohol use: Yes    Alcohol/week: 0.0 standard drinks    Comment: Occasionally 7  . Drug use: Yes    Frequency: 4.0 times per week    Types: Marijuana    Comment: Occasionally  . Sexual activity: Yes    Partners: Male    Birth control/protection: Surgical    Comment: tubal sch  Lifestyle  . Physical activity    Days per week: Not on file    Minutes per session: Not on file  . Stress: Not on file  Relationships  . Social Musician on phone: Not on file    Gets together: Not on file  Attends religious service: Not on file    Active member of club or organization: Not on file    Attends meetings of clubs or organizations: Not on file    Relationship status: Not on file  Other Topics Concern  . Not on file  Social History Narrative   Lives at home with her boyfriend.   Right-handed.   Drinks approximately two 2-liter sodas per day.   Drinks 1 cup coffee per day.    Allergies:  Allergies  Allergen Reactions  . Hydroxyzine     Causes her more anxiety.  [name]. Linzess [Linaclotide] Hives  . Lorazepam     Sedation  . Morphine And Related Other (See Comments)    Makes pt.aggressive  . Quetiapine     Sweating, irritability    Metabolic Disorder Labs: Lab Results  Component Value Date   HGBA1C 9.6 (H) 06/03/2019   MPG 220.21 06/20/2017   No results found for: PROLACTIN Lab Results  Component Value Date   CHOL 136 01/08/2019   TRIG 211 (H) 01/08/2019   HDL 28 (L) 01/08/2019   CHOLHDL 4.9 (H) 01/08/2019   LDLCALC 66 01/08/2019   LDLCALC 66 03/13/2018   Lab Results  Component Value Date   TSH 0.890 04/30/2018   TSH 1.930 09/26/2015    Therapeutic Level Labs: No results found for: LITHIUM No results found for: VALPROATE No components found for:  CBMZ  Current Medications: Current Outpatient Medications  Medication Sig Dispense Refill  . albuterol (PROVENTIL HFA;VENTOLIN HFA) 108 (90 Base) MCG/ACT inhaler Inhale 2 puffs into the lungs every 6 (six)  hours as needed for wheezing or shortness of breath. 1 Inhaler 2  . ALPRAZolam (XANAX) 0.25 MG tablet Take 0.5-1 tablets (0.125-0.25 mg total) by mouth 2 (two) times daily as needed for anxiety (depends on anxiety if takes 05-0 tablet). 15 tablet 2  . amLODipine (NORVASC) 5 MG tablet Take 1 tablet (5 mg total) by mouth daily. 90 tablet 0  . ARIPiprazole (ABILIFY) 10 MG tablet Take 1 tablet (10 mg total) by mouth daily. 90 tablet 0  . buPROPion (WELLBUTRIN XL) 300 MG 24 hr tablet Take 1 tablet (300 mg total) by mouth daily. 90 tablet 0  . cyclobenzaprine (FLEXERIL) 10 MG tablet Take 1 tablet (10 mg total) by mouth 3 (three) times daily as needed. for muscle spams 30 tablet 0  . dexlansoprazole (DEXILANT) 60 MG capsule TAKE 1 CAPSULE BY MOUTH ONCE DAILY (ENDOSCOPIC FAILURE ON NEXIUM. POOR CONTROL OF SYMPTOMS ON PANTOPRAZOLE) 30 capsule 3  . glucose blood (ACCU-CHEK GUIDE) test strip Use to check BG once daily at varying times of day. 100 each 3  . Lancets 30G MISC Dispense Accu-Chek guide lancets.  Check blood sugar at varying times once per day. 100 each 11  . lisinopril-hydrochlorothiazide (ZESTORETIC) 20-12.5 MG tablet Take 1 tablet by mouth daily. 90 tablet 0  . meloxicam (MOBIC) 7.5 MG tablet Take 2 tablets by mouth once daily 180 tablet 0  . metFORMIN (GLUCOPHAGE) 1000 MG tablet Take 1 tablet (1,000 mg total) by mouth 2 (two) times daily with a meal. 180 tablet 0  . metroNIDAZOLE (FLAGYL) 500 MG tablet Take 1 tablet (500 mg total) by mouth 2 (two) times daily. 14 tablet 0  . sertraline (ZOLOFT) 100 MG tablet 50 mg daily for one week, then 100 mg daily for one week, then 150 mg daily 135 tablet 0  . traZODone (DESYREL) 50 MG tablet Take 1 tablet (50 mg total) by mouth  at bedtime as needed for sleep. 30 tablet 2   No current facility-administered medications for this visit.      Musculoskeletal: Strength & Muscle Tone: N/A Gait & Station: N/A Patient leans: N/A  Psychiatric Specialty  Exam: Review of Systems  Psychiatric/Behavioral: Positive for depression, substance abuse and suicidal ideas. Negative for hallucinations and memory loss. The patient is nervous/anxious and has insomnia.   All other systems reviewed and are negative.   Last menstrual period 05/28/2017.There is no height or weight on file to calculate BMI.  General Appearance: Fairly Groomed  Eye Contact:  Good  Speech:  Clear and Coherent  Volume:  Normal  Mood:  Depressed  Affect:  Appropriate, Congruent, Labile and Tearful  Thought Process:  Coherent  Orientation:  Full (Time, Place, and Person)  Thought Content: Logical   Suicidal Thoughts:  Yes.  without intent/plan  Homicidal Thoughts:  No  Memory:  Immediate;   Good  Judgement:  Fair  Insight:  Shallow  Psychomotor Activity:  Normal  Concentration:  Concentration: Good and Attention Span: Good  Recall:  Good  Fund of Knowledge: Good  Language: Good  Akathisia:  No  Handed:  Right  AIMS (if indicated): not done  Assets:  Communication Skills Desire for Improvement  ADL's:  Intact  Cognition: WNL  Sleep:  hypersomnia   Screenings: GAD-7     Virtual BH Phone Follow Up from 10/15/2017 in [facility]  Total GAD-7 Score  0    PHQ2-9     Office Visit from 06/03/2019 in [facility] Office Visit from 10/12/2018 in [facility] Office Visit from 08/24/2018 in [facility] Office Visit from 04/23/2018 in [facility] Office Visit from 04/15/2018 in [facility]  PHQ-2 Total Score  0  PHQ-9 Total Score  -  Assessment and Plan:  Heidi [name] is a 28 y.o. year old female with a history of depression,  type II diabetes,hypertension, status post supracervical hysterectomywith bilateral salpingectomy, who presents for follow up appointment for MDD (major depressive disorder), recurrent  episode, moderate ([facility])  # MDD, moderate, recurrent without psychotic features # r.o PTSD Exam is notable for labile affect, and she reports worsening in depressive symptoms since the last visit.  Psychosocial stressors include loss of her sister from overdose.  She also does have a feeling of abandonment which she attributes to prior relationship and her mother.  Will switch from Pristiq to sertraline given patient's strong preference.  Discussed potential withdrawal symptoms, and risk of serotonin syndrome.  Will continue bupropion adjunctive treatment for depression.  Will continue Abilify as adjunctive treatment for depression.  Discussed potential metabolic side effect and EPS. Discussed behavioral activation. 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 She is encouraged to continue to see Mr. Sheets for therapy.   # Alcohol use  # Marijuana use She is at pre contemplative stage for marijuana use. She drinks alcohol occasionally. Provided psychoeducation regarding the possible long term negative outcome on chronic marijuana use. Will continue motivational interview.   Plan I have reviewed and updated plans as below 1. Decrease Pristiq 100 mg daily for one week, then discontinue  2. Start sertraline 50 mg daily for one week, then 100 mg daily for one week, 150 mg daily  3. Continuebupropion300 mg daily(limited  benefit from higher dose) 4. ContinueAbilify 10 mg at night 5. Next appointment: in December - Emergency resources which includes 911, ED, suicide crisis line [phone]) are discussed.  - Discussed attendance policy  Past trials of medication: sertraline, fluoxetine, lexapro, Pristiq, Wellbutrin, Abilify, latuda, olanzapine, Xanax (drowsiness)  The patient demonstrates the following risk factors for suicide: Chronic risk factors for suicide include:psychiatric disorder ofdepression, substance use disorder and previous self-harmof banging  her head. Acute risk factorsfor suicide include: N/A. Protective factorsfor this patient include: positive social support and hope for the future. Considering these factors, the overall suicide risk at this point appears to bechronically elevated, but not at imminent risk. She is future oriented and is amenable to treatment plan.Patient isappropriate for outpatient follow up.   The duration of this appointment visit was 25 minutes of face-to-face time with the patient.  Greater than 50% of this time was spent in counseling, explanation of  diagnosis, planning of further management, and coordination of care.  [name], MD 06/28/2019, 8:29 AM

## 2019-06-28 ENCOUNTER — Other Ambulatory Visit: Payer: Self-pay

## 2019-06-28 ENCOUNTER — Encounter: Payer: Self-pay | Admitting: Family Medicine

## 2019-06-28 ENCOUNTER — Ambulatory Visit (INDEPENDENT_AMBULATORY_CARE_PROVIDER_SITE_OTHER): Payer: Medicaid Other | Admitting: Psychiatry

## 2019-06-28 ENCOUNTER — Encounter (HOSPITAL_COMMUNITY): Payer: Self-pay | Admitting: Psychiatry

## 2019-06-28 ENCOUNTER — Telehealth (HOSPITAL_COMMUNITY): Payer: Self-pay | Admitting: *Deleted

## 2019-06-28 DIAGNOSIS — F331 Major depressive disorder, recurrent, moderate: Secondary | ICD-10-CM | POA: Diagnosis not present

## 2019-06-28 MED ORDER — SERTRALINE HCL 100 MG PO TABS: ORAL_TABLET | ORAL | 0 refills | Status: DC

## 2019-06-28 MED ORDER — ARIPIPRAZOLE 10 MG PO TABS: 10.0000 mg | ORAL_TABLET | Freq: Every day | ORAL | 0 refills | Status: DC

## 2019-06-28 MED ORDER — BUPROPION HCL ER (XL) 300 MG PO TB24: 300.0000 mg | ORAL_TABLET | Freq: Every day | ORAL | 0 refills | Status: DC

## 2019-06-28 NOTE — Patient Instructions (Signed)
1. Decrease Pristiq 100 mg daily for one week, then discontinue  2. Start sertraline 50 mg daily for one week, then 100 mg daily for one week, then 150 mg daily  3. Continuebupropion300 mg daily(limited benefit from higher dose) 4. ContinueAbilify 10 mg at night 5. Next appointment: in December  6. CONTACT INFORMATION  What to do if you need to get in touch with someone regarding a psychiatric issue:  1. EMERGENCY: For psychiatric emergencies (if you are suicidal or if there are any other safety issues) call 911 and/or go to your nearest Emergency Room immediately.   2. IF YOU NEED SOMEONE TO TALK TO RIGHT NOW: Given my clinical responsibilities, I may not be able to speak with you over the phone for a prolonged period of time.  a. You may always call The National Suicide Prevention Lifeline at 1-800-273-TALK [phone]).  b. Your county of residence will also have local crisis services. For [facility]: [place] at [phone] ([place])

## 2019-06-28 NOTE — Telephone Encounter (Signed)
[facility] TRACKS APPROVED ARIPIPRAZOLE (ABILIFY) 10 MG TABLET  P.A. #  :   [phone]   EFFECTIVE: 06/28/2019    THRU    06/22/2020

## 2019-08-04 ENCOUNTER — Ambulatory Visit (INDEPENDENT_AMBULATORY_CARE_PROVIDER_SITE_OTHER): Payer: Medicaid Other | Admitting: Licensed Clinical Social Worker

## 2019-08-04 ENCOUNTER — Encounter (HOSPITAL_COMMUNITY): Payer: Self-pay | Admitting: Licensed Clinical Social Worker

## 2019-08-04 ENCOUNTER — Other Ambulatory Visit: Payer: Self-pay

## 2019-08-04 DIAGNOSIS — F331 Major depressive disorder, recurrent, moderate: Secondary | ICD-10-CM

## 2019-08-04 NOTE — Progress Notes (Signed)
Virtual Visit via Video Note  I connected with Heidi [name] on 08/04/19 at 11:00 AM EST by a video enabled telemedicine application and verified that I am speaking with the correct person using two identifiers.  Location: Patient: Home Provider: Office   I discussed the limitations of evaluation and management by telemedicine and the availability of in person appointments. The patient expressed understanding and agreed to proceed.   THERAPIST PROGRESS NOTE  Session Time: 11:00 am -11:40 am  Participation Level: Active  Behavioral Response: CasualAlertDepressed  Type of Therapy: Individual Therapy  Treatment Goals addressed: Coping  Interventions: CBT and Solution Focused  Summary: Heidi [name] is a 28 y.o. female who presents oriented x5 (person, place, situation, time, and object), casually dressed, appropriately groomed, average heigh, overweight, and cooperative to address mood. Patient has a history of medical treatment including diabetes, GERD, and hypertension. Patient has a history of mental health treatment including outpatient therapy and medication management. Patient denies suicidal and homicidal ideations. Patient denies psychosis including auditory and visual hallucinations. Patient denies substance abuse. She is at low risk for lethality at this time.  Physically: Patient did not identify any issues.  Spiritually/values: No issues identified.  Relationships: Patient has a good relationship with her boyfriend. She has been working on her jealously. She is catching her self being jealous as well as trying to view things related to her past relationships and current relationship differently. She recognizes she has no evidence that her boyfriend would leave her or is cheating and she tries to remind herself of this which is freeing to her. She also is trying to look at past relationships as she was not "left" or "abadoned" but rather their journey together ended. She is  trying to learn from her past relationships.  Emotionally/Mentally/Behavior:  Patient noted that her mood has improved. She has been working on herself. After discussion, patient understood that the same approach she is taking to manage jealousy she can use to manage her anger. Patient was able to identify times when she was able to be angry but avoid snapping such as talking to neighbors who take her parking space.    Patient engaged in session. She responded well to interventions. Patient continues to meet criteria for MDD (major depressive disorder), recurrent episode, moderate. Patient will continue in outpatient therapy due to being the least restrictive service to meet her needs. Patient made minimal progress on her goals.    Suicidal/Homicidal: Negativewithout intent/plan  Therapist Response: Therapist reviewed patient's recent thoughts and behaviors. Therapist utilized CBT to address mood. Therapist processed patient's feelings to identify triggers for mood. Therapist discussed with patient what has gone well and what skills and resources that she has to manage her mood.   Plan: Return again in 4 weeks.  Diagnosis: Axis I: MDD (major depressive disorder), recurrent episode, moderate    Axis II: No diagnosis   I discussed the assessment and treatment plan with the patient. The patient was provided an opportunity to ask questions and all were answered. The patient agreed with the plan and demonstrated an understanding of the instructions.   The patient was advised to call back or seek an in-person evaluation if the symptoms worsen or if the condition fails to improve as anticipated.  I provided 40 minutes of non-face-to-face time during this encounter.    Glori Bickers, LCSW 08/04/2019

## 2019-08-06 ENCOUNTER — Other Ambulatory Visit: Payer: Self-pay | Admitting: Family Medicine

## 2019-08-10 ENCOUNTER — Other Ambulatory Visit: Payer: Self-pay

## 2019-08-10 ENCOUNTER — Ambulatory Visit (HOSPITAL_COMMUNITY): Payer: Medicaid Other | Admitting: Psychiatry

## 2019-08-17 ENCOUNTER — Other Ambulatory Visit: Payer: Self-pay | Admitting: Family Medicine

## 2019-08-17 ENCOUNTER — Ambulatory Visit: Payer: Self-pay | Admitting: Psychiatry

## 2019-08-17 ENCOUNTER — Telehealth (HOSPITAL_COMMUNITY): Payer: Self-pay | Admitting: Psychiatry

## 2019-08-17 ENCOUNTER — Encounter: Payer: Self-pay | Admitting: Psychiatry

## 2019-08-17 ENCOUNTER — Other Ambulatory Visit: Payer: Self-pay

## 2019-08-17 ENCOUNTER — Ambulatory Visit (INDEPENDENT_AMBULATORY_CARE_PROVIDER_SITE_OTHER): Payer: Medicaid Other | Admitting: Psychiatry

## 2019-08-17 DIAGNOSIS — F331 Major depressive disorder, recurrent, moderate: Secondary | ICD-10-CM | POA: Diagnosis not present

## 2019-08-17 DIAGNOSIS — F419 Anxiety disorder, unspecified: Secondary | ICD-10-CM | POA: Diagnosis not present

## 2019-08-17 MED ORDER — BUPROPION HCL ER (XL) 300 MG PO TB24: 300.0000 mg | ORAL_TABLET | Freq: Every day | ORAL | 0 refills | Status: DC

## 2019-08-17 MED ORDER — SERTRALINE HCL 100 MG PO TABS: ORAL_TABLET | ORAL | 0 refills | Status: DC

## 2019-08-17 MED ORDER — ALPRAZOLAM 0.25 MG PO TABS: 0.2500 mg | ORAL_TABLET | Freq: Two times a day (BID) | ORAL | 1 refills | Status: DC | PRN

## 2019-08-17 MED ORDER — ARIPIPRAZOLE 10 MG PO TABS: 10.0000 mg | ORAL_TABLET | Freq: Every day | ORAL | 0 refills | Status: DC

## 2019-08-17 NOTE — Progress Notes (Signed)
BH MD OP Progress Note  I connected with  Heidi MyrtleJessica Lemaster on 08/17/19 by a video enabled telemedicine application and verified that I am speaking with the correct person using two identifiers.   I discussed the limitations of evaluation and management by telemedicine. The patient expressed understanding and agreed to proceed.   08/17/2019 2:31 PM Heidi [name]  MRN:  [id number]  Chief Complaint:  " It never gets better."  HPI: Patient stated that she hates feeling like her current state.  She stated that nothing helps her.  She is not feeling satisfied with the switch to Zoloft recently.  She started crying bitterly and loudly.  When she was asked to elaborate if anything was bothering her in particular she replied, " It is too much to fit in 30 minutes." She stated, " I feel like shit."  She then stated that she has been on multiple different medications and now she has no other options left.  She also stated that she is not doing well as she has been seeing different psychiatrists inconsistently. She stated that she knows that she will never get better because everyone has been focusing on her symptoms and self underlying cause.  When asked to elaborate she replied that she believes she has ADHD.  She stated that she would like to be evaluated for that. Patient was noted to be extremely labile and a bit dramatic.  Patient was explained that due to the extreme mood lability she was displaying the evaluation may not reveal accurate results. It was explained that it was important to stabilize her mood before evaluating for ADHD and making that diagnosis. Patient was asked if her current regimen was helping her any.  She stated not really. However she stated she would like to continue taking it until something new can be found for her. She did report extreme anxiety.  Patient was offered clonazepam for the same however she reported that it makes her extremely groggy and pale.  She informed that she  has been on Xanax in the past with good results. Patient was suggested to go to the nearest emergency room due to her extreme mood lability.  However she stated since she is not having any suicidal thoughts she does not think she needs to go there.  Her boyfriend was in the room with her and tried to console her.   Visit Diagnosis:    ICD-10-CM   1. MDD (major depressive disorder), recurrent episode, moderate (HCC)  F33.1   2. Anxiety  F41.9     Past Psychiatric History: depression, anxiety, cluster B traits  Past Medical History:  Past Medical History:  Diagnosis Date  . Abdominal pain 01/10/2016  . Anxiety   . Back pain   . Bloating 01/10/2016  . Depression   . GERD (gastroesophageal reflux disease)   . Hematuria 01/10/2016  . Herpes simplex virus (HSV) infection   . Hypertension   . Osteoarthritis   . Prediabetes   . Schizo-affective schizophrenia (HCC)   . Type 2 diabetes mellitus (HCC) 03/28/2016  . Urinary frequency 01/10/2016    Past Surgical History:  Procedure Laterality Date  . BILATERAL SALPINGECTOMY Bilateral 06/24/2017   Procedure: BILATERAL SALPINGECTOMY;  Surgeon: Tilda BurrowFerguson, John V, MD;  Location: [facility];  Service: Gynecology;  Laterality: Bilateral;  . CESAREAN SECTION    . COLONOSCOPY WITH PROPOFOL N/A 03/17/2017   Procedure: COLONOSCOPY WITH PROPOFOL;  Surgeon: Corbin Adeourk, Robert M, MD;  Location: [facility];  Service: Endoscopy;  Laterality:  N/A;  9:30am  . DILATION AND CURETTAGE OF UTERUS    . ESOPHAGOGASTRODUODENOSCOPY (EGD) WITH PROPOFOL N/A 03/17/2017   Procedure: ESOPHAGOGASTRODUODENOSCOPY (EGD) WITH PROPOFOL;  Surgeon: Daneil Dolin, MD;  Location: [facility];  Service: Endoscopy;  Laterality: N/A;  . ESSURE TUBAL LIGATION    . SUPRACERVICAL ABDOMINAL HYSTERECTOMY N/A 06/24/2017   Procedure: SUPRACERVICAL ABDOMINAL HYSTERECTOMY, WIDE EXCISION OF CICATRIX;  Surgeon: Jonnie Kind, MD;  Location: [facility];  Service: Gynecology;  Laterality: N/A;     Family Psychiatric History: See below  Family History:  Family History  Problem Relation Age of Onset  . Depression Mother   . Hypertension Mother   . Diabetes Mother   . Mental illness Father   . Hypertension Father   . Diabetes Father   . Anxiety disorder Father   . Cancer Maternal Aunt   . Diabetes Paternal Grandmother   . Hypertension Paternal Grandmother   . Arthritis Paternal Grandmother   . Stroke Paternal Grandfather   . Heart disease Paternal Grandfather   . Hypertension Paternal Grandfather   . Diabetes Paternal Grandfather   . Schizophrenia Maternal Grandfather   . Alcohol abuse Maternal Grandfather   . Other Sister        recovering addict  . Schizophrenia Sister   . Irritable bowel syndrome Sister   . Drug abuse Sister   . Other Sister        recovering addict  . Bipolar disorder Sister   . Seizures Sister   . Drug abuse Sister   . Schizophrenia Sister   . Depression Paternal Aunt   . Anxiety disorder Paternal Aunt   . Colon cancer Neg Hx   . Inflammatory bowel disease Neg Hx   . Celiac disease Neg Hx     Social History:  Social History   Socioeconomic History  . Marital status: Legally Separated    Spouse name: Not on file  . Number of children: 1  . Years of education: HS  . Highest education level: Not on file  Occupational History  . Occupation: Unemployed  Tobacco Use  . Smoking status: Current Every Day Smoker    Packs/day: 1.00    Years: 10.00    Pack years: 10.00    Types: Cigarettes  . Smokeless tobacco: Never Used  Substance and Sexual Activity  . Alcohol use: Yes    Alcohol/week: 0.0 standard drinks    Comment: Occasionally 7  . Drug use: Yes    Frequency: 4.0 times per week    Types: Marijuana    Comment: Occasionally  . Sexual activity: Yes    Partners: Male    Birth control/protection: Surgical    Comment: tubal sch  Other Topics Concern  . Not on file  Social History Narrative   Lives at home with her  boyfriend.   Right-handed.   Drinks approximately two 2-liter sodas per day.   Drinks 1 cup coffee per day.   Social Determinants of Health   Financial Resource Strain:   . Difficulty of Paying Living Expenses: Not on file  Food Insecurity:   . Worried About Charity fundraiser in the Last Year: Not on file  . Ran Out of Food in the Last Year: Not on file  Transportation Needs:   . Lack of Transportation (Medical): Not on file  . Lack of Transportation (Non-Medical): Not on file  Physical Activity:   . Days of Exercise per Week: Not on file  . Minutes of  Exercise per Session: Not on file  Stress:   . Feeling of Stress : Not on file  Social Connections:   . Frequency of Communication with Friends and Family: Not on file  . Frequency of Social Gatherings with Friends and Family: Not on file  . Attends Religious Services: Not on file  . Active Member of Clubs or Organizations: Not on file  . Attends Banker Meetings: Not on file  . Marital Status: Not on file    Allergies:  Allergies  Allergen Reactions  . Hydroxyzine     Causes her more anxiety.  [name] [Linaclotide] Hives  . Lorazepam     Sedation  . Morphine And Related Other (See Comments)    Makes pt.aggressive  . Quetiapine     Sweating, irritability    Metabolic Disorder Labs: Lab Results  Component Value Date   HGBA1C 9.6 (H) 06/03/2019   MPG 220.21 06/20/2017   No results found for: PROLACTIN Lab Results  Component Value Date   CHOL 136 01/08/2019   TRIG 211 (H) 01/08/2019   HDL 28 (L) 01/08/2019   CHOLHDL 4.9 (H) 01/08/2019   LDLCALC 66 01/08/2019   LDLCALC 66 03/13/2018   Lab Results  Component Value Date   TSH 0.890 04/30/2018   TSH 1.930 09/26/2015    Therapeutic Level Labs: No results found for: LITHIUM No results found for: VALPROATE No components found for:  CBMZ  Current Medications: Current Outpatient Medications  Medication Sig Dispense Refill  . albuterol  (PROVENTIL HFA;VENTOLIN HFA) 108 (90 Base) MCG/ACT inhaler Inhale 2 puffs into the lungs every 6 (six) hours as needed for wheezing or shortness of breath. 1 Inhaler 2  . ALPRAZolam (XANAX) 0.25 MG tablet Take 0.5-1 tablets (0.125-0.25 mg total) by mouth 2 (two) times daily as needed for anxiety (depends on anxiety if takes 05-0 tablet). 15 tablet 2  . amLODipine (NORVASC) 5 MG tablet Take 1 tablet (5 mg total) by mouth daily. 90 tablet 0  . ARIPiprazole (ABILIFY) 10 MG tablet Take 1 tablet (10 mg total) by mouth daily. 90 tablet 0  . buPROPion (WELLBUTRIN XL) 300 MG 24 hr tablet Take 1 tablet (300 mg total) by mouth daily. 90 tablet 0  . cyclobenzaprine (FLEXERIL) 10 MG tablet Take 1 tablet by mouth three times daily as needed for muscle spasm 30 tablet 0  . dexlansoprazole (DEXILANT) 60 MG capsule TAKE 1 CAPSULE BY MOUTH ONCE DAILY (ENDOSCOPIC FAILURE ON NEXIUM. POOR CONTROL OF SYMPTOMS ON PANTOPRAZOLE) 30 capsule 3  . glucose blood (ACCU-CHEK GUIDE) test strip Use to check BG once daily at varying times of day. 100 each 3  . Lancets 30G MISC Dispense Accu-Chek guide lancets.  Check blood sugar at varying times once per day. 100 each 11  . lisinopril-hydrochlorothiazide (ZESTORETIC) 20-12.5 MG tablet Take 1 tablet by mouth daily. 90 tablet 0  . meloxicam (MOBIC) 7.5 MG tablet Take 2 tablets by mouth once daily 180 tablet 0  . metFORMIN (GLUCOPHAGE) 1000 MG tablet Take 1 tablet (1,000 mg total) by mouth 2 (two) times daily with a meal. 180 tablet 0  . metroNIDAZOLE (FLAGYL) 500 MG tablet Take 1 tablet (500 mg total) by mouth 2 (two) times daily. 14 tablet 0  . sertraline (ZOLOFT) 100 MG tablet 50 mg daily for one week, then 100 mg daily for one week, then 150 mg daily 135 tablet 0  . traZODone (DESYREL) 50 MG tablet Take 1 tablet (50 mg total) by mouth  at bedtime as needed for sleep. 30 tablet 2   No current facility-administered medications for this visit.     Musculoskeletal: Strength &  Muscle Tone: unable to assess due to telemed visit Gait & Station: unable to assess due to telemed visit Patient leans: unable to assess due to telemed visit  Psychiatric Specialty Exam: Review of Systems  Last menstrual period 05/28/2017.There is no height or weight on file to calculate BMI.  General Appearance: Disheveled  Eye Contact:  Good  Speech:  Clear and Coherent and Normal Rate  Volume:  Normal  Mood:  Depressed, Dysphoric and Tearful  Affect:  Depressed, Labile and Tearful  Thought Process:  Linear and Descriptions of Associations: Intact  Orientation:  Full (Time, Place, and Person)  Thought Content: Fixated on ADHD evaluation.   Suicidal Thoughts:  No  Homicidal Thoughts:  No  Memory:  Recent;   Good Remote;   Good  Judgement:  Fair  Insight:  Fair  Psychomotor Activity:  Normal  Concentration:  Concentration: Good and Attention Span: Fair  Recall:  Good  Fund of Knowledge: Good  Language: Good  Akathisia:  Negative  Handed:  Right  AIMS (if indicated):  Not done  Assets:  Communication Skills Desire for Improvement Financial Resources/Insurance Housing Social Support  ADL's:  Intact  Cognition: WNL  Sleep:  Fair   Screenings: GAD-7     Virtual BH Phone Follow Up from 10/15/2017 in [facility]  Total GAD-7 Score  0    PHQ2-9     Office Visit from 06/03/2019 in [facility] Office Visit from 10/12/2018 in [facility] Office Visit from 08/24/2018 in [facility] Office Visit from 04/23/2018 in [facility] Office Visit from 04/15/2018 in [facility]  PHQ-2 Total Score  0  PHQ-9 Total Score  --  Assessment and Plan: Patient was noted to be extremely labile during the session.  She stated that she wanted an evaluation of ADHD as she believes that is the cause for all her symptoms.  Patient was  explained at length that due to her mood lability and active her evaluation and diagnosis at this point was difficult.  I tried to see if we can adjust her antidepressant medication but patient seemed fixated on continuing the same regimen for now.  She was asked to try to cut down the Abilify to half dose (5 mg) to see if that makes a difference.  She was given Xanax 0.25 mg twice daily prescription for anxiety and immediate symptom relief.  She denied any suicidal ideations and declined the need to go to the emergency room. I have reached out to her therapist Mr. Ivin Booty to make contact with her.  1. MDD (major depressive disorder), recurrent episode, moderate (HCC)  - sertraline (ZOLOFT) 100 MG tablet; Take 3 tablets daily  Dispense: 270 tablet; Refill: 0 - buPROPion (WELLBUTRIN XL) 300 MG 24 hr tablet; Take 1 tablet (300 mg total) by mouth daily.  Dispense: 90 tablet; Refill: 0 - ARIPiprazole (ABILIFY) 10 MG tablet; Take 1 tablet (10 mg total) by mouth daily.  Dispense: 90 tablet; Refill: 0  2. Anxiety  - sertraline (ZOLOFT) 100 MG tablet; Take 3 tablets daily  Dispense: 270 tablet; Refill: 0 - ALPRAZolam (XANAX) 0.25 MG tablet; Take 1 tablet (0.25 mg total) by  mouth 2 (two) times daily as needed for anxiety.  Dispense: 60 tablet; Refill: 1  F/up in 1 month.   Zena Amos, MD 08/17/2019, 2:31 PM

## 2019-08-17 NOTE — Telephone Encounter (Signed)
I spoke with patient few moments ago via telepsych.  She was extremely distraught and tearful during the session.  She stated that nothing helps her.  She stated, " I feel like shit."  She wants to be evaluated for ADHD. Patient was explained due to her extreme mood lability diagnosis is difficult at this time. Her boyfriend was with her in the room.  She was advised to go to the emergency room.  She denied any suicidal ideations and therefore declined the need to go to the emergency room.  Mr. Yves Dill- can you please reach out to her.  Ms. Posey Pronto- Can you please add her to my schedule in January. I can not find an open spot, may have to move a Dr. Lemmie Evens pt to fit her in.

## 2019-08-17 NOTE — Telephone Encounter (Signed)
I spoke with patient few moments ago via telepsych.  She was extremely distraught and tearful during the session.  She stated that nothing helps her.  She stated, " I feel like shit."  She wants to be evaluated for ADHD. Patient was explained due to her extreme mood lability diagnosis is difficult at this time. Her boyfriend was with her in the room.  She was advised to go to the emergency room.  She denied any suicidal ideations and therefore declined the need to go to the emergency room.   Mr. Yves Dill- can you please reach out to her.

## 2019-08-18 ENCOUNTER — Ambulatory Visit: Payer: Self-pay | Admitting: Psychiatry

## 2019-09-15 ENCOUNTER — Other Ambulatory Visit: Payer: Self-pay | Admitting: Family Medicine

## 2019-09-19 IMAGING — CT CT ABD-PELV W/ CM
2 of 5 series · 15 of 46 positions shown, 17 images · IV contrast (Isovue)
Comparison: CT abdomen pelvis 11/16/2016.

CLINICAL DATA: Patient with history of hysterectomy and
salpingectomy. Lower pelvic pain.

EXAM:
CT ABDOMEN AND PELVIS WITH CONTRAST
TECHNIQUE: Multidetector CT imaging of the abdomen and pelvis was performed
using the standard protocol following bolus administration of
intravenous contrast.
CONTRAST:  100mL [id number] IOPAMIDOL (DZYQA0-6VV) INJECTION 61%

[Series 2: axial st · axial · 0.88mm/px · z∈[-638,-203]mm · 12 of 103 slices shown, 14 images]
[im 8/103  soft-tissue]
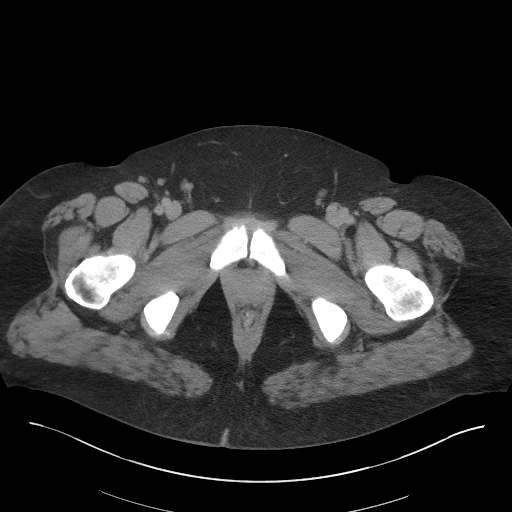
[im 8/103  bone]
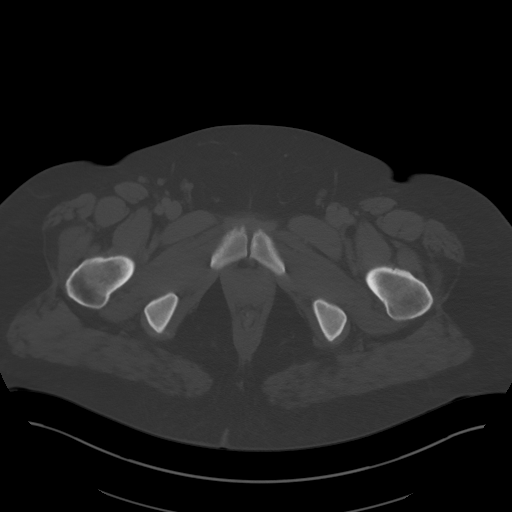
[im 16/103  soft-tissue]
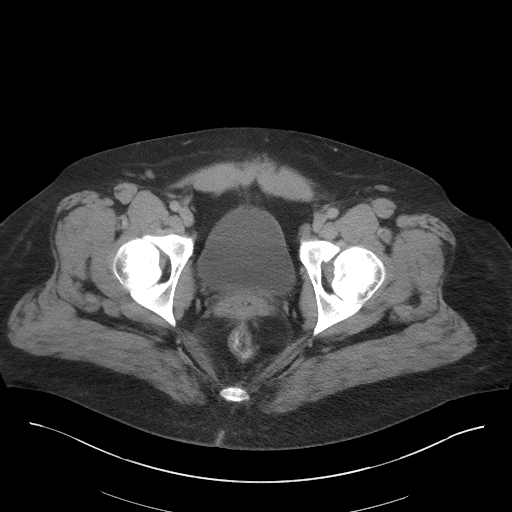
[im 24/103  soft-tissue]
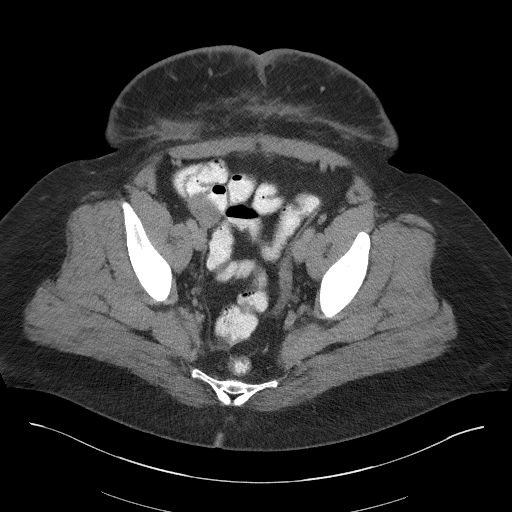
[im 32/103  soft-tissue]
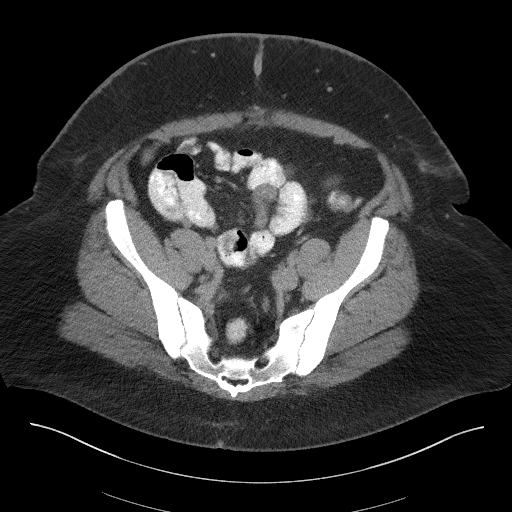
[im 40/103  soft-tissue]
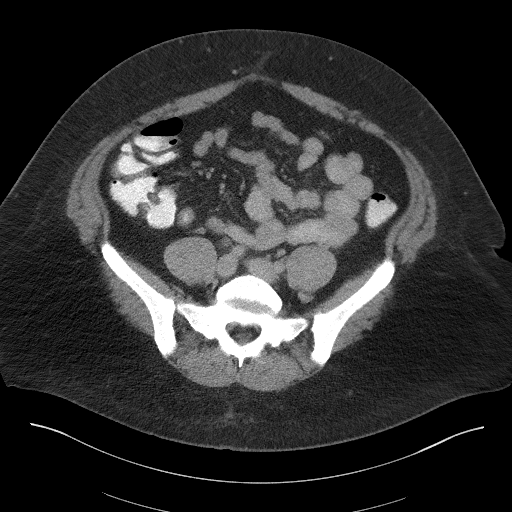
[im 48/103  soft-tissue]
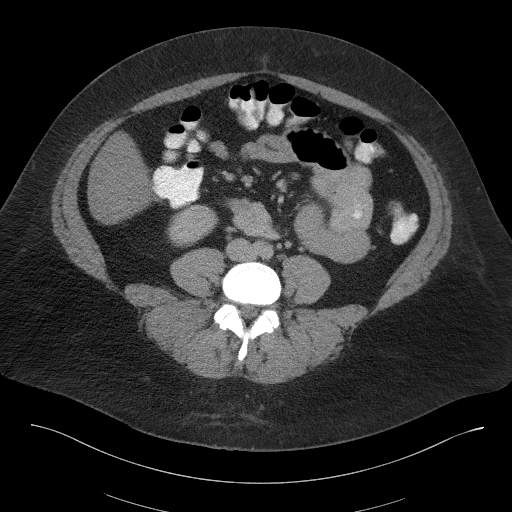
[im 55/103  soft-tissue]
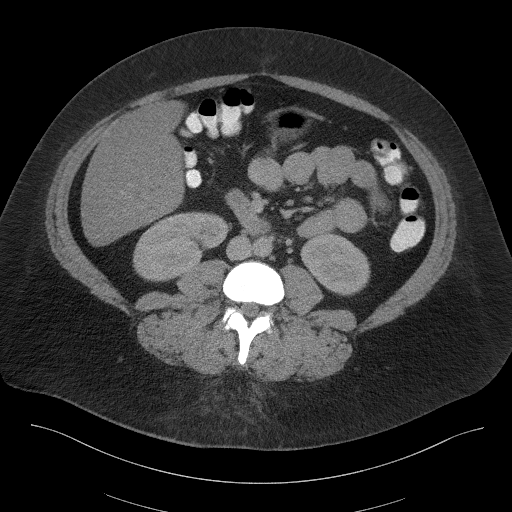
[im 63/103  soft-tissue]
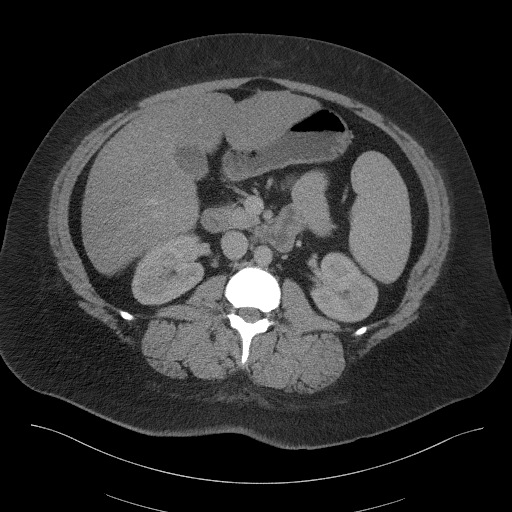
[im 71/103  soft-tissue]
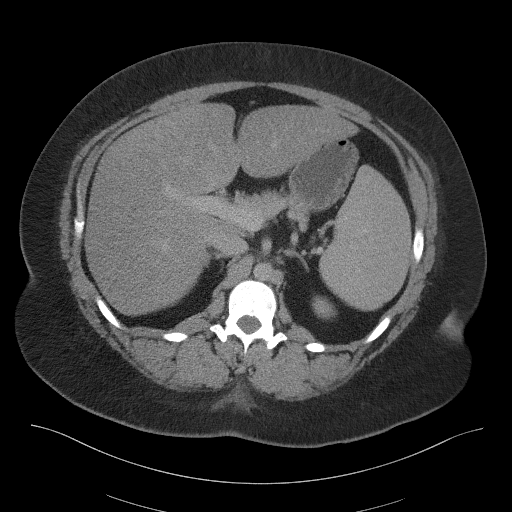
[im 71/103  bone]
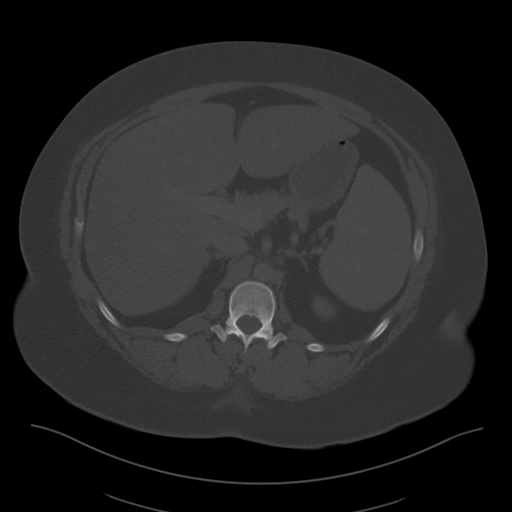
[im 79/103  soft-tissue]
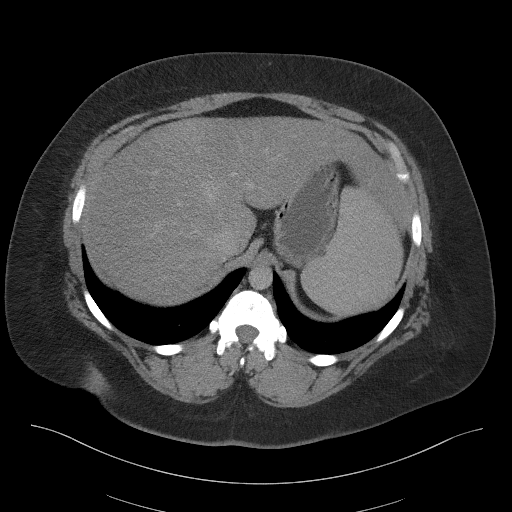
[im 87/103  soft-tissue]
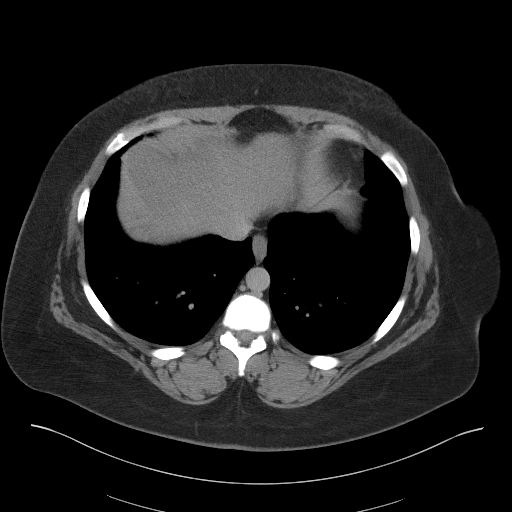
[im 95/103  soft-tissue]
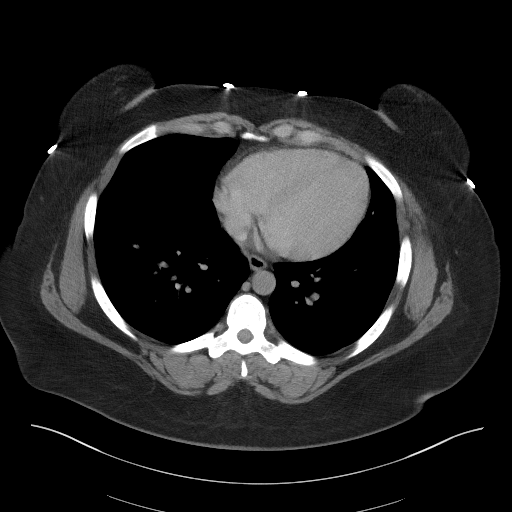

[Series 6: coronal st · coronal · 0.89mm/px · 3 of 117 slices shown]
[im 39/117  soft-tissue]
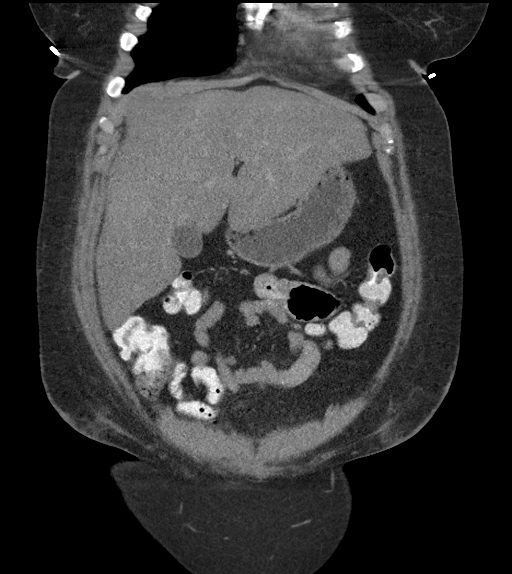
[im 52/117  soft-tissue]
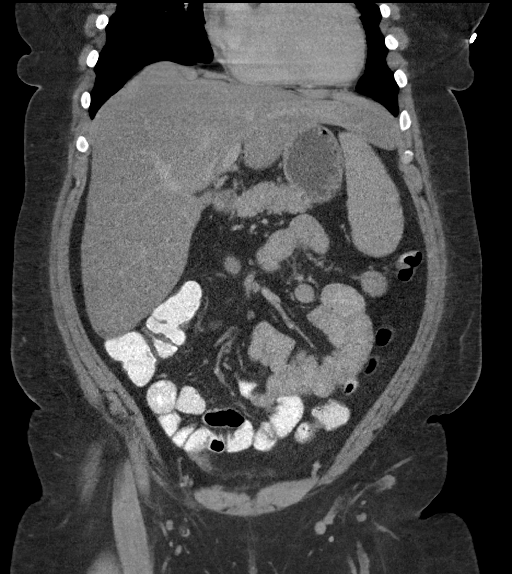
[im 65/117  soft-tissue]
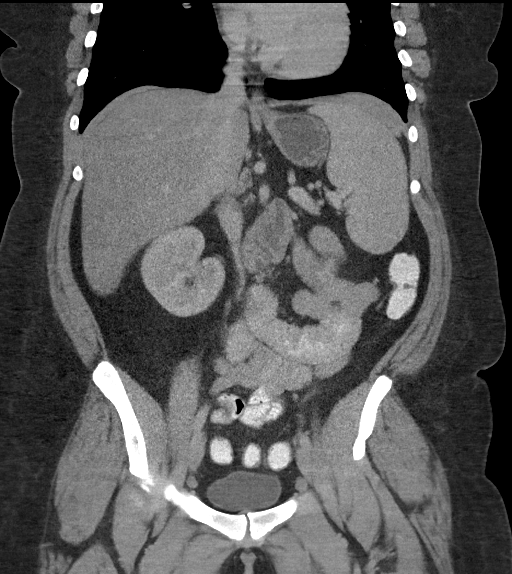

[15 of 46 positions shown; findings below may reference images not displayed]

FINDINGS: Lower chest: Normal heart size. No pericardial effusion. Lung bases
are clear. No pleural effusion.

Hepatobiliary: Liver is diffusely low in attenuation compatible with
steatosis. No focal hepatic lesion is identified. Gallbladder is
unremarkable. No intrahepatic or extrahepatic biliary ductal
dilatation.

Pancreas: Unremarkable

Spleen: Mildly enlarged measuring 14 cm.

Adrenals/Urinary Tract: Normal adrenal glands. Kidneys enhance
symmetrically with contrast. No hydronephrosis. Urinary bladder is
unremarkable.

Stomach/Bowel: Normal morphology of the stomach. No abnormal bowel
wall thickening or evidence for bowel obstruction. No free fluid or
free intraperitoneal air.

Vascular/Lymphatic: Normal caliber abdominal aorta. No
retroperitoneal lymphadenopathy.

Reproductive: Status post hysterectomy. The ovaries are normal in
appearance.

Other: Postsurgical changes involving the lower anterior abdominal
wall. There is a thin horizontal fluid collection measuring 7.8 x
2.5 cm (image 82; series 2) within the lower anterior abdominal
wall.

Musculoskeletal: No aggressive or acute appearing osseous lesions.
IMPRESSION: 1. There is an elongated fluid collection within the lower anterior
abdominal wall which may represent postsurgical seroma. Superimposed
infection not excluded.
2. No intra-abdominal fluid collection is identified.
3. Hepatic steatosis.
4. Splenomegaly

## 2019-09-21 ENCOUNTER — Other Ambulatory Visit: Payer: Self-pay

## 2019-09-21 ENCOUNTER — Ambulatory Visit (INDEPENDENT_AMBULATORY_CARE_PROVIDER_SITE_OTHER): Payer: Medicaid Other | Admitting: Licensed Clinical Social Worker

## 2019-09-21 DIAGNOSIS — F331 Major depressive disorder, recurrent, moderate: Secondary | ICD-10-CM

## 2019-09-21 NOTE — Progress Notes (Signed)
Virtual Visit via Video Note  I connected with Heidi [name] on 09/21/19 at 11:00 AM EST by a video enabled telemedicine application and verified that I am speaking with the correct person using two identifiers.  Location: Patient: Home Provider: Office   I discussed the limitations of evaluation and management by telemedicine and the availability of in person appointments. The patient expressed understanding and agreed to proceed.   THERAPIST PROGRESS NOTE  Session Time: 11:00 am -11:40 am  Participation Level: Active  Behavioral Response: CasualAlertDepressed  Type of Therapy: Individual Therapy  Treatment Goals addressed: Coping  Interventions: CBT and Solution Focused  Summary: Heidi [name] is a 29 y.o. female who presents oriented x5 (person, place, situation, time, and object), casually dressed, appropriately groomed, average heigh, overweight, and cooperative to address mood. Patient has a history of medical treatment including diabetes, GERD, and hypertension. Patient has a history of mental health treatment including outpatient therapy and medication management. Patient denies suicidal and homicidal ideations. Patient denies psychosis including auditory and visual hallucinations. Patient denies substance abuse. She is at low risk for lethality at this time.  Physically: Patient continues to find it difficult to manage her concentration. She wants to be tested for ADHD and got emotional saying that she feels like no one listens to her about her concerns related to having ADHD. Spiritually/values: No issues identified.  Relationships: Patient feels like her relationship with her boyfriend is stronger than ever. She feels like they have solid trust now. She has worked on her jealousy for the past year and it has helped. Patient also noted that her relationship is now poly amorous. They are looking for someone to "date" but haven't found anyone yet.    Emotionally/Mentally/Behavior:  Patient noted that her mood has been up and down. She was able to identify positive things that are happening such as with her relationship and some struggles such as being unemployed. Patient quit her job after a difficult day and experiencing physical pain. Patient is going to continue to look for a job. She understands that staying home with idle time will not benefit her.   Patient engaged in session. She responded well to interventions. Patient continues to meet criteria for MDD (major depressive disorder), recurrent episode, moderate. Patient will continue in outpatient therapy due to being the least restrictive service to meet her needs. Patient made minimal progress on her goals.    Suicidal/Homicidal: Negativewithout intent/plan  Therapist Response: Therapist reviewed patient's recent thoughts and behaviors. Therapist utilized CBT to address mood. Therapist processed patient's feelings to identify triggers for mood. Therapist discussed with patient what has gone well and what skills and triggers for mood.   Plan: Return again in 4 weeks.  Diagnosis: Axis I: MDD (major depressive disorder), recurrent episode, moderate    Axis II: No diagnosis   I discussed the assessment and treatment plan with the patient. The patient was provided an opportunity to ask questions and all were answered. The patient agreed with the plan and demonstrated an understanding of the instructions.   The patient was advised to call back or seek an in-person evaluation if the symptoms worsen or if the condition fails to improve as anticipated.  I provided 45 minutes of non-face-to-face time during this encounter.    Bynum Bellows, LCSW 09/21/2019

## 2019-09-24 ENCOUNTER — Other Ambulatory Visit: Payer: Self-pay | Admitting: Nurse Practitioner

## 2019-09-24 DIAGNOSIS — K2100 Gastro-esophageal reflux disease with esophagitis, without bleeding: Secondary | ICD-10-CM

## 2019-09-24 NOTE — Telephone Encounter (Signed)
Needs an OV. Last seen 2018. Limited Dexilant provided.

## 2019-09-27 ENCOUNTER — Encounter: Payer: Self-pay | Admitting: Internal Medicine

## 2019-09-27 ENCOUNTER — Other Ambulatory Visit: Payer: Self-pay

## 2019-09-27 ENCOUNTER — Ambulatory Visit (INDEPENDENT_AMBULATORY_CARE_PROVIDER_SITE_OTHER): Payer: Medicaid Other | Admitting: Psychiatry

## 2019-09-27 ENCOUNTER — Encounter: Payer: Self-pay | Admitting: Psychiatry

## 2019-09-27 DIAGNOSIS — F902 Attention-deficit hyperactivity disorder, combined type: Secondary | ICD-10-CM

## 2019-09-27 DIAGNOSIS — F3341 Major depressive disorder, recurrent, in partial remission: Secondary | ICD-10-CM | POA: Diagnosis not present

## 2019-09-27 DIAGNOSIS — F419 Anxiety disorder, unspecified: Secondary | ICD-10-CM | POA: Diagnosis not present

## 2019-09-27 DIAGNOSIS — F331 Major depressive disorder, recurrent, moderate: Secondary | ICD-10-CM | POA: Diagnosis not present

## 2019-09-27 MED ORDER — AMPHETAMINE-DEXTROAMPHET ER 10 MG PO CP24: 10.0000 mg | ORAL_CAPSULE | ORAL | 0 refills | Status: DC

## 2019-09-27 NOTE — Telephone Encounter (Signed)
PATIENT SCHEDULED  °

## 2019-09-27 NOTE — Progress Notes (Signed)
[facility] MD OP Progress Note  I connected with  Suzann Lazaro on 09/27/19 by a video enabled telemedicine application and verified that I am speaking with the correct person using two identifiers.   I discussed the limitations of evaluation and management by telemedicine. The patient expressed understanding and agreed to proceed.   09/27/2019 9:37 AM Cheralyn Oliver  MRN:  [id number]  Chief Complaint:  Pt lt a cigarette at the beginning of the session and stated that " I felt you were not listening to me last time. I cry easily."  HPI: Patient expressed that she felt the writer did not listen to her concerns last time while she was crying. I explained to her that seeing her cry bitterly was overwhelming and that is why I was recommending that she gets her mood stabilized before ADHD evaluation and medications can be prescribed. Pt. Was calm and agreeable to undergo Adult ADHD Self-Report Scale (ASRS-v1.1) Symptom Checklist  today. She answered "very often" to almost all the questions on the screen. She reported that she cut down the dose of Abilify and that has not made a significant difference to her mood. She does not think wellbutrin is helping her much either. She informed that she has cut down the dose of Xanax to PRN use only. She only uses it 3-4 times per week. She reported that she generally tends to smoke a cigarette when she is stressed or anxious.  Pt was offered stimulant medication to target ADHD symptoms, information about methylphenidate-based medications and amphetamine-based medications was provided.  Patient stated that she did not want to take Ritalin.  She was agreeable to trial of Adderall XR 10 mg in the morning.  Patient denied any history of legal issues.  She does have history of hypertension for which she is prescribed amlodipine and lisinopril-HCTZ by PCP. She informed that her son has ADHD and is on Focalin and is doing well.  Visit Diagnosis:    ICD-10-CM   1. MDD (major  depressive disorder), recurrent, in partial remission ([place])  F33.41   2. Anxiety  F41.9   3. Attention deficit hyperactivity disorder (ADHD), combined type  F90.2     Past Psychiatric History: depression, anxiety, cluster B traits  Past Medical History:  Past Medical History:  Diagnosis Date  . Abdominal pain 01/10/2016  . Anxiety   . Back pain   . Bloating 01/10/2016  . Depression   . GERD (gastroesophageal reflux disease)   . Hematuria 01/10/2016  . Herpes simplex virus (HSV) infection   . Hypertension   . Osteoarthritis   . Prediabetes   . Schizo-affective schizophrenia ([place])   . Type 2 diabetes mellitus ([place]) 03/28/2016  . Urinary frequency 01/10/2016    Past Surgical History:  Procedure Laterality Date  . BILATERAL SALPINGECTOMY Bilateral 06/24/2017   Procedure: BILATERAL SALPINGECTOMY;  Surgeon: Jonnie Kind, MD;  Location: [facility];  Service: Gynecology;  Laterality: Bilateral;  . CESAREAN SECTION    . COLONOSCOPY WITH PROPOFOL N/A 03/17/2017   Procedure: COLONOSCOPY WITH PROPOFOL;  Surgeon: Daneil Dolin, MD;  Location: [facility];  Service: Endoscopy;  Laterality: N/A;  9:30am  . DILATION AND CURETTAGE OF UTERUS    . ESOPHAGOGASTRODUODENOSCOPY (EGD) WITH PROPOFOL N/A 03/17/2017   Procedure: ESOPHAGOGASTRODUODENOSCOPY (EGD) WITH PROPOFOL;  Surgeon: Daneil Dolin, MD;  Location: [facility];  Service: Endoscopy;  Laterality: N/A;  . ESSURE TUBAL LIGATION    . SUPRACERVICAL ABDOMINAL HYSTERECTOMY N/A 06/24/2017   Procedure: SUPRACERVICAL ABDOMINAL  HYSTERECTOMY, WIDE EXCISION OF CICATRIX;  Surgeon: Jonnie Kind, MD;  Location: [facility];  Service: Gynecology;  Laterality: N/A;    Family Psychiatric History: See below  Family History:  Family History  Problem Relation Age of Onset  . Depression Mother   . Hypertension Mother   . Diabetes Mother   . Mental illness Father   . Hypertension Father   . Diabetes Father   . Anxiety disorder Father   . Cancer  Maternal Aunt   . Diabetes Paternal Grandmother   . Hypertension Paternal Grandmother   . Arthritis Paternal Grandmother   . Stroke Paternal Grandfather   . Heart disease Paternal Grandfather   . Hypertension Paternal Grandfather   . Diabetes Paternal Grandfather   . Schizophrenia Maternal Grandfather   . Alcohol abuse Maternal Grandfather   . Other Sister        recovering addict  . Schizophrenia Sister   . Irritable bowel syndrome Sister   . Drug abuse Sister   . Other Sister        recovering addict  . Bipolar disorder Sister   . Seizures Sister   . Drug abuse Sister   . Schizophrenia Sister   . Depression Paternal Aunt   . Anxiety disorder Paternal Aunt   . Colon cancer Neg Hx   . Inflammatory bowel disease Neg Hx   . Celiac disease Neg Hx     Social History:  Social History   Socioeconomic History  . Marital status: Legally Separated    Spouse name: Not on file  . Number of children: 1  . Years of education: HS  . Highest education level: Not on file  Occupational History  . Occupation: Unemployed  Tobacco Use  . Smoking status: Current Every Day Smoker    Packs/day: 1.00    Years: 10.00    Pack years: 10.00    Types: Cigarettes  . Smokeless tobacco: Never Used  Substance and Sexual Activity  . Alcohol use: Yes    Alcohol/week: 0.0 standard drinks    Comment: Occasionally 7  . Drug use: Yes    Frequency: 4.0 times per week    Types: Marijuana    Comment: Occasionally  . Sexual activity: Yes    Partners: Male    Birth control/protection: Surgical    Comment: tubal sch  Other Topics Concern  . Not on file  Social History Narrative   Lives at home with her boyfriend.   Right-handed.   Drinks approximately two 2-liter sodas per day.   Drinks 1 cup coffee per day.   Social Determinants of Health   Financial Resource Strain:   . Difficulty of Paying Living Expenses: Not on file  Food Insecurity:   . Worried About Charity fundraiser in the Last  Year: Not on file  . Ran Out of Food in the Last Year: Not on file  Transportation Needs:   . Lack of Transportation (Medical): Not on file  . Lack of Transportation (Non-Medical): Not on file  Physical Activity:   . Days of Exercise per Week: Not on file  . Minutes of Exercise per Session: Not on file  Stress:   . Feeling of Stress : Not on file  Social Connections:   . Frequency of Communication with Friends and Family: Not on file  . Frequency of Social Gatherings with Friends and Family: Not on file  . Attends Religious Services: Not on file  . Active Member of Clubs or Organizations:  Not on file  . Attends Archivist Meetings: Not on file  . Marital Status: Not on file    Allergies:  Allergies  Allergen Reactions  . Hydroxyzine     Causes her more anxiety.  Rolan Lipa [Linaclotide] Hives  . Lorazepam     Sedation  . Morphine And Related Other (See Comments)    Makes pt.aggressive  . Quetiapine     Sweating, irritability    Metabolic Disorder Labs: Lab Results  Component Value Date   HGBA1C 9.6 (H) 06/03/2019   MPG 220.21 06/20/2017   No results found for: PROLACTIN Lab Results  Component Value Date   CHOL 136 01/08/2019   TRIG 211 (H) 01/08/2019   HDL 28 (L) 01/08/2019   CHOLHDL 4.9 (H) 01/08/2019   LDLCALC 66 01/08/2019   LDLCALC 66 03/13/2018   Lab Results  Component Value Date   TSH 0.890 04/30/2018   TSH 1.930 09/26/2015    Therapeutic Level Labs: No results found for: LITHIUM No results found for: VALPROATE No components found for:  CBMZ  Current Medications: Current Outpatient Medications  Medication Sig Dispense Refill  . albuterol (PROVENTIL HFA;VENTOLIN HFA) 108 (90 Base) MCG/ACT inhaler Inhale 2 puffs into the lungs every 6 (six) hours as needed for wheezing or shortness of breath. 1 Inhaler 2  . ALPRAZolam (XANAX) 0.25 MG tablet Take 0.5-1 tablets (0.125-0.25 mg total) by mouth 2 (two) times daily as needed for anxiety (depends  on anxiety if takes 05-0 tablet). 15 tablet 2  . ALPRAZolam (XANAX) 0.25 MG tablet Take 1 tablet (0.25 mg total) by mouth 2 (two) times daily as needed for anxiety. 60 tablet 1  . amLODipine (NORVASC) 5 MG tablet Take 1 tablet (5 mg total) by mouth daily. 90 tablet 0  . ARIPiprazole (ABILIFY) 10 MG tablet Take 1 tablet (10 mg total) by mouth daily. 90 tablet 0  . buPROPion (WELLBUTRIN XL) 300 MG 24 hr tablet Take 1 tablet (300 mg total) by mouth daily. 90 tablet 0  . cyclobenzaprine (FLEXERIL) 10 MG tablet Take 1 tablet by mouth three times daily as needed for muscle spasm 30 tablet 0  . DEXILANT 60 MG capsule TAKE 1 CAPSULE BY MOUTH ONCE DAILY (ENDOSCOPIC  FAILURE  ON  NEXIUM.  POOR  CONTROL  OF  SYMPTOMS  ON  PANTOPRAZOLE) 30 capsule 0  . glucose blood (ACCU-CHEK GUIDE) test strip Use to check BG once daily at varying times of day. 100 each 3  . Lancets 30G MISC Dispense Accu-Chek guide lancets.  Check blood sugar at varying times once per day. 100 each 11  . lisinopril-hydrochlorothiazide (ZESTORETIC) 20-12.5 MG tablet Take 1 tablet by mouth daily. 90 tablet 0  . meloxicam (MOBIC) 7.5 MG tablet Take 2 tablets by mouth once daily 180 tablet 0  . metFORMIN (GLUCOPHAGE) 1000 MG tablet TAKE 1 TABLET BY MOUTH TWICE DAILY WITH A MEAL 180 tablet 0  . metroNIDAZOLE (FLAGYL) 500 MG tablet Take 1 tablet (500 mg total) by mouth 2 (two) times daily. 14 tablet 0  . sertraline (ZOLOFT) 100 MG tablet Take 3 tablets daily 270 tablet 0  . traZODone (DESYREL) 50 MG tablet Take 1 tablet (50 mg total) by mouth at bedtime as needed for sleep. 30 tablet 2   No current facility-administered medications for this visit.     Musculoskeletal: Strength & Muscle Tone: unable to assess due to telemed visit Gait & Station: unable to assess due to telemed visit Patient leans: unable  to assess due to telemed visit  Psychiatric Specialty Exam: Review of Systems  Last menstrual period 05/28/2017.There is no height or  weight on file to calculate BMI.  General Appearance: Fairly Groomed  Eye Contact:  Good  Speech:  Clear and Coherent and Normal Rate  Volume:  Normal  Mood:  Less depressed compared to last visit  Affect:  Congruent  Thought Process:  Goal Directed, Linear and Descriptions of Associations: Intact  Orientation:  Full (Time, Place, and Person)  Thought Content: Logical   Suicidal Thoughts:  No  Homicidal Thoughts:  No  Memory:  Recent;   Good Remote;   Good  Judgement:  Fair  Insight:  Fair  Psychomotor Activity:  Normal  Concentration:  Concentration: Good and Attention Span: Fair  Recall:  Good  Fund of Knowledge: Good  Language: Good  Akathisia:  Negative  Handed:  Right  AIMS (if indicated):  Not done  Assets:  Communication Skills Desire for Improvement Financial Resources/Insurance Housing Social Support  ADL's:  Intact  Cognition: WNL  Sleep:  Fair   Screenings: Adult ADHD Self-Report Scale (ASRS-v1.1) Symptom Checklist GAD-7     Virtual [facility] Phone Follow Up from 10/15/2017 in [facility]  Total GAD-7 Score  0    PHQ2-9     Office Visit from 06/03/2019 in [facility] Visit from 10/12/2018 in [facility] Visit from 08/24/2018 in [facility] Office Visit from 04/23/2018 in [place] [facility] [place] Visit from 04/15/2018 in [facility]  PHQ-2 Total Score  0  _0 PHQ-9 Total Score  --  _1 Assessment and Plan: Patient was noted to be calm and composed at the time of this visit.  She was screened for ADHD by using however self-report questionnaire.  She met criteria for ADHD, combined type.  She was offered Adderall XR to target the symptoms. Potential side effects of medication and risks vs benefits of treatment vs non-treatment were explained and discussed. All questions were answered.  She was agreeable to  discontinuing Wellbutrin due to lack of efficacy.  She has been taking half tablet of Abilify and agrees to continue to do so.  1. MDD (major depressive disorder), recurrent, in partial remission ([facility]) - Continue Sertraline 300 mg daily. - Continue Abilify 5 mg (half tab of 10 mg) daily. - Discontinue Wellbutrin XL due to lack of efficacy.  2. Anxiety - Continue Xanax 0.25 mg BID PRN  3. Attention deficit hyperactivity disorder (ADHD), combined type  -Start amphetamine-dextroamphetamine (ADDERALL XR) 10 MG 24 hr capsule; Take 1 capsule (10 mg total) by mouth every morning.  Dispense: 30 capsule; Refill: 0  Continue individual therapy. Follow-up in 4 to 6 weeks with Dr. Modesta Messing.  [place] Crane, MD 09/27/2019, 9:37 AM

## 2019-09-30 ENCOUNTER — Telehealth (HOSPITAL_COMMUNITY): Payer: Self-pay | Admitting: *Deleted

## 2019-09-30 NOTE — Telephone Encounter (Signed)
PATIENT CALLED REQUESTING A CALL BACK SHE ASKED IF  amphetamine-dextroamphetamine (ADDERALL XR) 10 MG 24 hr capsule COULD BE INCREASED TO 2 X DAILY LIKE @ LEAST 5 HRS APPT??

## 2019-10-01 NOTE — Telephone Encounter (Signed)
Attempted Call to Patient  (NO ANSWER) to Inform Per Provider: (PROVIDER) I called the pt at the number provided and there was no response. The answer is no as it is an extended release form so it can not be taken twice daily. The current dose was just started on 1/26 so my recommendation is she continues taking Adderall XR 10 mg qam for now as prescribed

## 2019-10-01 NOTE — Telephone Encounter (Signed)
I called the pt at the number provided and there was no response. The answer is no as it is an extended release form so it can not be taken twice daily. The current dose was just started on 1/26 so my recommendation is she continues taking Adderall XR 10 mg qam for now as prescribed.

## 2019-10-12 ENCOUNTER — Other Ambulatory Visit: Payer: Self-pay | Admitting: Family Medicine

## 2019-10-12 DIAGNOSIS — M545 Low back pain, unspecified: Secondary | ICD-10-CM

## 2019-10-12 DIAGNOSIS — G8929 Other chronic pain: Secondary | ICD-10-CM

## 2019-10-18 ENCOUNTER — Encounter: Payer: Self-pay | Admitting: Family Medicine

## 2019-10-18 MED ORDER — FLUCONAZOLE 150 MG PO TABS: 150.0000 mg | ORAL_TABLET | Freq: Once | ORAL | 1 refills | Status: AC

## 2019-10-19 ENCOUNTER — Ambulatory Visit (INDEPENDENT_AMBULATORY_CARE_PROVIDER_SITE_OTHER): Payer: Medicaid Other | Admitting: Licensed Clinical Social Worker

## 2019-10-19 ENCOUNTER — Other Ambulatory Visit: Payer: Self-pay

## 2019-10-19 DIAGNOSIS — F331 Major depressive disorder, recurrent, moderate: Secondary | ICD-10-CM

## 2019-10-19 NOTE — Progress Notes (Signed)
Virtual Visit via Video Note  I connected with Heidi [name] on 10/19/19 at 11:00 AM EST by a video enabled telemedicine application and verified that I am speaking with the correct person using two identifiers.  Location: Patient: Home Provider: Office   I discussed the limitations of evaluation and management by telemedicine and the availability of in person appointments. The patient expressed understanding and agreed to proceed.   THERAPIST PROGRESS NOTE  Session Time: 11:00 am -11:40 am  Participation Level: Active  Behavioral Response: CasualAlertDepressed  Type of Therapy: Individual Therapy  Treatment Goals addressed: Coping  Interventions: CBT and Solution Focused  Case Summary: Heidi [name] is a 29 y.o. female who presents oriented x5 (person, place, situation, time, and object), casually dressed, appropriately groomed, average heigh, overweight, and cooperative to address mood. Patient has a history of medical treatment including diabetes, GERD, and hypertension. Patient has a history of mental health treatment including outpatient therapy and medication management. Patient denies suicidal and homicidal ideations. Patient denies psychosis including auditory and visual hallucinations. Patient denies substance abuse. She is at low risk for lethality at this time.  Physically: Patient has multiple health issues including chronic pain, high blood pressure, diabetes. Patient feels like she will never have a time when she is free from pain. She has accepted this. She is getting checked out for rheumatoid arthritis. She has been diagnosed with osteoarthritis.  Patient gets shots in her knees. Patient is taking medicine for ADHD and feels like she can see a difference but feels like she may need a stronger dose. She knows to talk to her doctor about this.  Spiritually/values: No issues identified.  Relationships: Patient feels like her relationship with her boyfriend is going well. He  is a great support to her.   Emotionally/Mentally/Behavior:  Patient's mood has been ok but she has had moments of feeling down. She recognizes that when she feels ignored that triggers feelings of not feeling wanted. Patient does not want to have to seek validation from others. Patient was hesitant to identify a future where her mood and anxiety were stable due to never experiencing this in the past but was willing try. Patient was able to identify that when her anxiety and depression are stable she will be more happy, feel more whole, and be more confident. Patient will have good relationships and accept herself. She will be managing her feelings well and will validate herself. Patient noted that she could start saying positive affirmations to herself. She was able to recognize a few.   Patient engaged in session. She responded well to interventions. Patient continues to meet criteria for MDD (major depressive disorder), recurrent episode, moderate. Patient will continue in outpatient therapy due to being the least restrictive service to meet her needs. Patient made minimal progress on her goals.    Suicidal/Homicidal: Negativewithout intent/plan  Therapist Response: Therapist reviewed patient's recent thoughts and behaviors. Therapist utilized CBT to address mood. Therapist processed patient's feelings to identify triggers for mood. Therapist had patient identify a future where her mood is stable and steps she could take to achieve that now. Therapist provided patient with 101 positive body affirmations.   Plan: Return again in 4 weeks.  Diagnosis: Axis I: MDD (major depressive disorder), recurrent episode, moderate    Axis II: No diagnosis   I discussed the assessment and treatment plan with the patient. The patient was provided an opportunity to ask questions and all were answered. The patient agreed with the plan and demonstrated  an understanding of the instructions.   The patient was advised  to call back or seek an in-person evaluation if the symptoms worsen or if the condition fails to improve as anticipated.  I provided 45 minutes of non-face-to-face time during this encounter.    Bynum Bellows, LCSW 10/19/2019

## 2019-10-25 ENCOUNTER — Ambulatory Visit: Payer: Self-pay | Admitting: Gastroenterology

## 2019-10-25 NOTE — Progress Notes (Deleted)
Primary Care Physician: Dettinger, Elige Radon, MD  Primary Gastroenterologist:  Roetta Sessions, MD   No chief complaint on file.   HPI: Heidi [name] is a 29 y.o. female here for follow-up, office visit due to refills.  Last seen September 2018.  History of reflux esophagitis on EGD in 2018.  Also with history of abnormal LFTs with negative hepatitis B and C serologies in 2018.  Liver appeared normal on CT in March 2018.  Plans for follow-up but she did not complete.  Since then she had another CT in December 2018 showing fatty liver.  Spleen mildly enlarged at 14 cm.  She underwent hysterectomy with bilateral salpingoectomy for pelvic pain and removal of Essure sterilization system in October 2018.  Labs from May 2020: AST 26, ALT slightly elevated at 45, total bilirubin less than 0.2, alkaline phosphatase 143  Current Outpatient Medications  Medication Sig Dispense Refill  . albuterol (PROVENTIL HFA;VENTOLIN HFA) 108 (90 Base) MCG/ACT inhaler Inhale 2 puffs into the lungs every 6 (six) hours as needed for wheezing or shortness of breath. 1 Inhaler 2  . ALPRAZolam (XANAX) 0.25 MG tablet Take 0.5-1 tablets (0.125-0.25 mg total) by mouth 2 (two) times daily as needed for anxiety (depends on anxiety if takes 05-0 tablet). 15 tablet 2  . ALPRAZolam (XANAX) 0.25 MG tablet Take 1 tablet (0.25 mg total) by mouth 2 (two) times daily as needed for anxiety. 60 tablet 1  . amLODipine (NORVASC) 5 MG tablet Take 1 tablet (5 mg total) by mouth daily. 90 tablet 0  . amphetamine-dextroamphetamine (ADDERALL XR) 10 MG 24 hr capsule Take 1 capsule (10 mg total) by mouth every morning. 30 capsule 0  . ARIPiprazole (ABILIFY) 10 MG tablet Take 1 tablet (10 mg total) by mouth daily. 90 tablet 0  . cyclobenzaprine (FLEXERIL) 10 MG tablet Take 1 tablet by mouth three times daily as needed for muscle spasm 30 tablet 0  . DEXILANT 60 MG capsule TAKE 1 CAPSULE BY MOUTH ONCE DAILY (ENDOSCOPIC  FAILURE  ON  NEXIUM.   POOR  CONTROL  OF  SYMPTOMS  ON  PANTOPRAZOLE) 30 capsule 0  . glucose blood (ACCU-CHEK GUIDE) test strip Use to check BG once daily at varying times of day. 100 each 3  . Lancets 30G MISC Dispense Accu-Chek guide lancets.  Check blood sugar at varying times once per day. 100 each 11  . lisinopril-hydrochlorothiazide (ZESTORETIC) 20-12.5 MG tablet Take 1 tablet by mouth daily. 90 tablet 0  . meloxicam (MOBIC) 7.5 MG tablet Take 2 tablets by mouth once daily 180 tablet 0  . metFORMIN (GLUCOPHAGE) 1000 MG tablet TAKE 1 TABLET BY MOUTH TWICE DAILY WITH A MEAL 180 tablet 0  . metroNIDAZOLE (FLAGYL) 500 MG tablet Take 1 tablet (500 mg total) by mouth 2 (two) times daily. 14 tablet 0  . sertraline (ZOLOFT) 100 MG tablet Take 3 tablets daily 270 tablet 0  . traZODone (DESYREL) 50 MG tablet Take 1 tablet (50 mg total) by mouth at bedtime as needed for sleep. 30 tablet 2   No current facility-administered medications for this visit.    Allergies as of 10/25/2019 - Review Complete 09/27/2019  Allergen Reaction Noted  . Hydroxyzine  10/23/2016  . Linzess [linaclotide] Hives 03/13/2017  . Lorazepam  11/29/2015  . Morphine and related Other (See Comments) 08/31/2014  . Quetiapine  11/29/2015    ROS:  General: Negative for anorexia, weight loss, fever, chills, fatigue, weakness. ENT: Negative for hoarseness,  difficulty swallowing , nasal congestion. CV: Negative for chest pain, angina, palpitations, dyspnea on exertion, peripheral edema.  Respiratory: Negative for dyspnea at rest, dyspnea on exertion, cough, sputum, wheezing.  GI: See history of present illness. GU:  Negative for dysuria, hematuria, urinary incontinence, urinary frequency, nocturnal urination.  Endo: Negative for unusual weight change.    Physical Examination:   LMP 05/28/2017 Comment: [place]  General: Well-nourished, well-developed in no acute distress.  Eyes: No icterus. Mouth: Oropharyngeal mucosa moist and pink , no lesions  erythema or exudate. Lungs: Clear to auscultation bilaterally.  Heart: Regular rate and rhythm, no murmurs rubs or gallops.  Abdomen: Bowel sounds are normal, nontender, nondistended, no hepatosplenomegaly or masses, no abdominal bruits or hernia , no rebound or guarding.   Extremities: No lower extremity edema. No clubbing or deformities. Neuro: Alert and oriented x 4   Skin: Warm and dry, no jaundice.   Psych: Alert and cooperative, normal mood and affect.  Labs:  Lab Results  Component Value Date   CREATININE 0.86 06/03/2019   BUN 8 06/03/2019   NA 137 06/03/2019   K 4.4 06/03/2019   CL 99 06/03/2019   CO2 24 06/03/2019   Lab Results  Component Value Date   ALT 45 (H) 01/08/2019   AST 26 01/08/2019   ALKPHOS 143 (H) 01/08/2019   BILITOT <0.2 01/08/2019   Lab Results  Component Value Date   WBC 11.8 (H) 01/08/2019   HGB 13.6 01/08/2019   HCT 41.8 01/08/2019   MCV 88 01/08/2019   PLT 315 01/08/2019   No results found for: IRON, TIBC, FERRITIN   Imaging Studies: No results found.

## 2019-10-27 ENCOUNTER — Telehealth (HOSPITAL_COMMUNITY): Payer: Self-pay | Admitting: *Deleted

## 2019-10-27 NOTE — Progress Notes (Addendum)
[facility] MD/PA/NP OP Progress Note  10/28/2019 12:43 PM Heidi [name]  MRN:  [id number]  Chief Complaint:  Chief Complaint    Depression; Follow-up     HPI:  - She was seen by Dr. Toy Care. She was started on Adderall XR and Xanax.  bupropion was discontinued, Abilify was tapered down. sertraline was uptitrated to 300 mg.  She state that although she has been doing better since she was started on Adderall, she believes that the dose needs to be increased. She has difficulty in sustaining attention, doing multi taking. She has been forgetful. She graduated from high school and dropped out from college. She denies any issues at high school and denies being in IEP. She uses marijuana at least one gram every day "to slow down" and for concentration. After she is provided psycho education about its potential negative impact on her attention, she became labile, insisting that marijuana has helped the patient. She declined to stop using marijuana. She had two drinks of liquor last Saturday. She denies daily alcohol use.   Depression - She continues to feel depressed without significant triggers, although it has been improving a little since the last visit. She talks about an episode of "freaked out" when she found her phone was not charged. She likes using [employer]. She enjoys meeting with her son every other weekend. She thinks that she is abele to fulfill the need of her son, and feels good about it. She has passive SI, although she denies any intent or plans. Although her father at home does have a rifle, she does not know how to use it and she does not consider it as a way for suicide. She feels fatigue. She has difficulty in concentration. She has mild anhedonia. She feels anxious, tense. She takes xanax once a day for anxiety/daily panic attacks.   Visit Diagnosis:    ICD-10-CM   1. MDD (major depressive disorder), recurrent episode, moderate (HCC)  F33.1 sertraline (ZOLOFT) 100 MG tablet  2. Marijuana  use, continuous  F12.90   3. Anxiety  F41.9 sertraline (ZOLOFT) 100 MG tablet  4. Inattention  R41.840 amphetamine-dextroamphetamine (ADDERALL XR) 10 MG 24 hr capsule    Past Psychiatric History: Please see initial evaluation for full details. I have reviewed the history. No updates at this time.     Past Medical History:  Past Medical History:  Diagnosis Date  . Abdominal pain 01/10/2016  . Anxiety   . Back pain   . Bloating 01/10/2016  . Depression   . GERD (gastroesophageal reflux disease)   . Hematuria 01/10/2016  . Herpes simplex virus (HSV) infection   . Hypertension   . Osteoarthritis   . Prediabetes   . Schizo-affective schizophrenia ([place])   . Type 2 diabetes mellitus ([place]) 03/28/2016  . Urinary frequency 01/10/2016    Past Surgical History:  Procedure Laterality Date  . BILATERAL SALPINGECTOMY Bilateral 06/24/2017   Procedure: BILATERAL SALPINGECTOMY;  Surgeon: Jonnie Kind, MD;  Location: [facility];  Service: Gynecology;  Laterality: Bilateral;  . CESAREAN SECTION    . COLONOSCOPY WITH PROPOFOL N/A 03/17/2017   Procedure: COLONOSCOPY WITH PROPOFOL;  Surgeon: Daneil Dolin, MD;  Location: [facility];  Service: Endoscopy;  Laterality: N/A;  9:30am  . DILATION AND CURETTAGE OF UTERUS    . ESOPHAGOGASTRODUODENOSCOPY (EGD) WITH PROPOFOL N/A 03/17/2017   Procedure: ESOPHAGOGASTRODUODENOSCOPY (EGD) WITH PROPOFOL;  Surgeon: Daneil Dolin, MD;  Location: [facility];  Service: Endoscopy;  Laterality: N/A;  .  ESSURE TUBAL LIGATION    . SUPRACERVICAL ABDOMINAL HYSTERECTOMY N/A 06/24/2017   Procedure: SUPRACERVICAL ABDOMINAL HYSTERECTOMY, WIDE EXCISION OF CICATRIX;  Surgeon: Tilda Burrow, MD;  Location: [facility];  Service: Gynecology;  Laterality: N/A;    Family Psychiatric History: Please see initial evaluation for full details. I have reviewed the history. No updates at this time.     Family History:  Family History  Problem Relation Age of Onset  . Depression  Mother   . Hypertension Mother   . Diabetes Mother   . Mental illness Father   . Hypertension Father   . Diabetes Father   . Anxiety disorder Father   . Cancer Maternal Aunt   . Diabetes Paternal Grandmother   . Hypertension Paternal Grandmother   . Arthritis Paternal Grandmother   . Stroke Paternal Grandfather   . Heart disease Paternal Grandfather   . Hypertension Paternal Grandfather   . Diabetes Paternal Grandfather   . Schizophrenia Maternal Grandfather   . Alcohol abuse Maternal Grandfather   . Other Sister        recovering addict  . Schizophrenia Sister   . Irritable bowel syndrome Sister   . Drug abuse Sister   . Other Sister        recovering addict  . Bipolar disorder Sister   . Seizures Sister   . Drug abuse Sister   . Schizophrenia Sister   . Depression Paternal Aunt   . Anxiety disorder Paternal Aunt   . Colon cancer Neg Hx   . Inflammatory bowel disease Neg Hx   . Celiac disease Neg Hx     Social History:  Social History   Socioeconomic History  . Marital status: Legally Separated    Spouse name: Not on file  . Number of children: 1  . Years of education: HS  . Highest education level: Not on file  Occupational History  . Occupation: Unemployed  Tobacco Use  . Smoking status: Current Every Day Smoker    Packs/day: 1.00    Years: 10.00    Pack years: 10.00    Types: Cigarettes  . Smokeless tobacco: Never Used  Substance and Sexual Activity  . Alcohol use: Yes    Alcohol/week: 0.0 standard drinks    Comment: Occasionally 7  . Drug use: Yes    Frequency: 4.0 times per week    Types: Marijuana    Comment: Occasionally  . Sexual activity: Yes    Partners: Male    Birth control/protection: Surgical    Comment: tubal sch  Other Topics Concern  . Not on file  Social History Narrative   Lives at home with her boyfriend.   Right-handed.   Drinks approximately two 2-liter sodas per day.   Drinks 1 cup coffee per day.   Social Determinants  of Health   Financial Resource Strain:   . Difficulty of Paying Living Expenses: Not on file  Food Insecurity:   . Worried About Programme researcher, broadcasting/film/video in the Last Year: Not on file  . Ran Out of Food in the Last Year: Not on file  Transportation Needs:   . Lack of Transportation (Medical): Not on file  . Lack of Transportation (Non-Medical): Not on file  Physical Activity:   . Days of Exercise per Week: Not on file  . Minutes of Exercise per Session: Not on file  Stress:   . Feeling of Stress : Not on file  Social Connections:   . Frequency of Communication with  Friends and Family: Not on file  . Frequency of Social Gatherings with Friends and Family: Not on file  . Attends Religious Services: Not on file  . Active Member of Clubs or Organizations: Not on file  . Attends Banker Meetings: Not on file  . Marital Status: Not on file    Allergies:  Allergies  Allergen Reactions  . Hydroxyzine     Causes her more anxiety.  [name] [Linaclotide] Hives  . Lorazepam     Sedation  . Morphine And Related Other (See Comments)    Makes pt.aggressive  . Quetiapine     Sweating, irritability    Metabolic Disorder Labs: Lab Results  Component Value Date   HGBA1C 9.6 (H) 06/03/2019   MPG 220.21 06/20/2017   No results found for: PROLACTIN Lab Results  Component Value Date   CHOL 136 01/08/2019   TRIG 211 (H) 01/08/2019   HDL 28 (L) 01/08/2019   CHOLHDL 4.9 (H) 01/08/2019   LDLCALC 66 01/08/2019   LDLCALC 66 03/13/2018   Lab Results  Component Value Date   TSH 0.890 04/30/2018   TSH 1.930 09/26/2015    Therapeutic Level Labs: No results found for: LITHIUM No results found for: VALPROATE No components found for:  CBMZ  Current Medications: Current Outpatient Medications  Medication Sig Dispense Refill  . albuterol (PROVENTIL HFA;VENTOLIN HFA) 108 (90 Base) MCG/ACT inhaler Inhale 2 puffs into the lungs every 6 (six) hours as needed for wheezing or  shortness of breath. 1 Inhaler 2  . ALPRAZolam (XANAX) 0.25 MG tablet Take 0.5-1 tablets (0.125-0.25 mg total) by mouth 2 (two) times daily as needed for anxiety (depends on anxiety if takes 05-0 tablet). 15 tablet 2  . ALPRAZolam (XANAX) 0.25 MG tablet Take 1 tablet (0.25 mg total) by mouth 2 (two) times daily as needed for anxiety. 60 tablet 1  . amLODipine (NORVASC) 5 MG tablet Take 1 tablet (5 mg total) by mouth daily. 90 tablet 0  . amphetamine-dextroamphetamine (ADDERALL XR) 10 MG 24 hr capsule Take 1 capsule (10 mg total) by mouth every morning. 30 capsule 0  . ARIPiprazole (ABILIFY) 10 MG tablet Take 1 tablet (10 mg total) by mouth daily. 90 tablet 0  . cyclobenzaprine (FLEXERIL) 10 MG tablet Take 1 tablet by mouth three times daily as needed for muscle spasm 30 tablet 0  . DEXILANT 60 MG capsule TAKE 1 CAPSULE BY MOUTH ONCE DAILY (ENDOSCOPIC  FAILURE  ON  NEXIUM.  POOR  CONTROL  OF  SYMPTOMS  ON  PANTOPRAZOLE) 30 capsule 0  . glucose blood (ACCU-CHEK GUIDE) test strip Use to check BG once daily at varying times of day. 100 each 3  . Lancets 30G MISC Dispense Accu-Chek guide lancets.  Check blood sugar at varying times once per day. 100 each 11  . lisinopril-hydrochlorothiazide (ZESTORETIC) 20-12.5 MG tablet Take 1 tablet by mouth daily. 90 tablet 0  . meloxicam (MOBIC) 7.5 MG tablet Take 2 tablets by mouth once daily 180 tablet 0  . metFORMIN (GLUCOPHAGE) 1000 MG tablet TAKE 1 TABLET BY MOUTH TWICE DAILY WITH A MEAL 180 tablet 0  . metroNIDAZOLE (FLAGYL) 500 MG tablet Take 1 tablet (500 mg total) by mouth 2 (two) times daily. 14 tablet 0  . sertraline (ZOLOFT) 100 MG tablet Take 2 tablets (200 mg total) by mouth daily. 60 tablet 0  . traZODone (DESYREL) 50 MG tablet Take 1 tablet (50 mg total) by mouth at bedtime as needed for sleep.  30 tablet 2   No current facility-administered medications for this visit.     Musculoskeletal: Strength & Muscle Tone: N/A Gait & Station: N/A Patient  leans: N/A  Psychiatric Specialty Exam: Review of Systems  Psychiatric/Behavioral: Positive for decreased concentration, dysphoric mood, sleep disturbance and suicidal ideas. Negative for agitation, behavioral problems, confusion, hallucinations and self-injury. The patient is nervous/anxious. The patient is not hyperactive.   All other systems reviewed and are negative.   Last menstrual period 05/28/2017.There is no height or weight on file to calculate BMI.  General Appearance: Fairly Groomed  Eye Contact:  Good  Speech:  Clear and Coherent  Volume:  Normal  Mood:  Anxious and Depressed  Affect:  Appropriate, Congruent and Labile  Thought Process:  Coherent  Orientation:  Full (Time, Place, and Person)  Thought Content: Logical   Suicidal Thoughts:  Yes.  without intent/plan  Homicidal Thoughts:  No  Memory:  Immediate;   Good  Judgement:  Intact  Insight:  Present  Psychomotor Activity:  Normal  Concentration:  Concentration: Good and Attention Span: Good  Recall:  Good  Fund of Knowledge: Good  Language: Good  Akathisia:  No  Handed:  Right  AIMS (if indicated): not done  Assets:  Communication Skills Desire for Improvement  ADL's:  Intact  Cognition: WNL  Sleep:  Fair   Screenings: GAD-7     Virtual BH Phone Follow Up from 10/15/2017 in [facility]  Total GAD-7 Score  0    PHQ2-9     Office Visit from 06/03/2019 in [facility] Office Visit from 10/12/2018 in [facility] Office Visit from 08/24/2018 in [facility] Office Visit from 04/23/2018 in [facility] Office Visit from 04/15/2018 in [facility]  PHQ-2 Total Score  0  2  2  2  2   PHQ-9 Total Score  --  9  9  6  9        Assessment and Plan:  Dicie Edelen is a 29 y.o. year old female with a history of depression,type II diabetes,hypertension, status post supracervical  hysterectomywith bilateral salpingectomy , who presents for follow up appointment for MDD (major depressive disorder), recurrent episode, moderate (HCC) - Plan: sertraline (ZOLOFT) 100 MG tablet  Marijuana use, continuous  Anxiety - Plan: sertraline (ZOLOFT) 100 MG tablet  Inattention - Plan: amphetamine-dextroamphetamine (ADDERALL XR) 10 MG 24 hr capsule  # MDD, moderate, recurrent without psychotic features # r/o PTSD She demonstrates labile affect, and reports depressive symptoms since the last visit.  She has been on higher dose of sertraline; will taper this medication to avoid any potential side effect.  Will continue current dose of Abilify as adjunctive treatment for depression.  Will lower the dose xanax prn for anxiety. It would be strongly encouraged to use this only for a short term given her history of marijuana use, and binge alcohol use and family history of heroin use. Noted that she does have cluster B traits, which significantly impacts on her mood. She will greatly benefit from therapy.  She will be followed by Mr. Sheets for therapy.   # Inattention # Marijuana use # history of binge alcohol use Although she was found positive on ASRS-v1.1, it is difficult to discern the etiology of inattention due to ongoing marijuana use and mood symptoms. Discussed in length regarding the negative impact of marijuana use on her attention. She is not amenable to stop using marijuana. Will  prescribe Adderall XR for inattention only for a month. She is aware that this medication would not be prescribed from this writer anymore if she were to continue to use marijuana.   After having discussed the treatment plans, the patient has chosen to transfer care to another psychiatrist. Referral coordinator will be contacted for this referral. She is informed that this writer would be available for a month if any worsening in her mood symptoms and/or if any questions or concerns. Emergency resources are  discussed.   Plan 1. Decrease sertraline 200 mg daily  2  Decrease xanax 0.25 mg daily as needed for anxiety (She has one refill left ordered from Dr. Evelene Croon) 3. ContinueAbilify 5mg  at night 4. Adderall XR 10 mg daily is ordered for a month 4. Her care will be transferred to another psychiatrist.   - Emergency resources which includes 911, ED, suicide crisis line [phone]) are discussed.   Past trials of medication:sertraline, fluoxetine, lexapro, Pristiq,Wellbutrin, Abilify, latuda, olanzapine, Xanax (drowsiness)  I have reviewed suicide assessment in detail. No change in the following assessment.   The patient demonstrates the following risk factors for suicide: Chronic risk factors for suicide include:psychiatric disorder ofdepression, substance use disorder and previous self-harmof banging her head. Acute risk factorsfor suicide include: N/A. Protective factorsfor this patient include: positive social support and hope for the future. Considering these factors, the overall suicide risk at this point appears to bechronically elevated, but not at imminent risk. She is future oriented and is amenable to treatment plan.Patient isappropriate for outpatient follow up.   ([phone], MD 10/28/2019, 12:43 PM

## 2019-10-27 NOTE — Telephone Encounter (Signed)
PATIENT CALLED TODAY ASKING TO INCREASE ADDERALL. I REMINDED HER OF THE CONVERSATION  10/01/19 PER DR Evelene Croon: The answer is no as it is an extended release form so it can not be taken twice daily. The current dose was just started on 1/26 so my recommendation is she continues taking Adderall XR 10 mg qam for now as prescribed

## 2019-10-28 ENCOUNTER — Encounter (HOSPITAL_COMMUNITY): Payer: Self-pay | Admitting: Psychiatry

## 2019-10-28 ENCOUNTER — Ambulatory Visit (HOSPITAL_COMMUNITY): Payer: Medicaid Other | Admitting: Psychiatry

## 2019-10-28 ENCOUNTER — Ambulatory Visit (INDEPENDENT_AMBULATORY_CARE_PROVIDER_SITE_OTHER): Payer: Medicaid Other | Admitting: Psychiatry

## 2019-10-28 ENCOUNTER — Other Ambulatory Visit: Payer: Self-pay

## 2019-10-28 DIAGNOSIS — F331 Major depressive disorder, recurrent, moderate: Secondary | ICD-10-CM

## 2019-10-28 DIAGNOSIS — F419 Anxiety disorder, unspecified: Secondary | ICD-10-CM | POA: Diagnosis not present

## 2019-10-28 DIAGNOSIS — R41840 Attention and concentration deficit: Secondary | ICD-10-CM | POA: Diagnosis not present

## 2019-10-28 DIAGNOSIS — F1290 Cannabis use, unspecified, uncomplicated: Secondary | ICD-10-CM

## 2019-10-28 MED ORDER — AMPHETAMINE-DEXTROAMPHET ER 10 MG PO CP24: 10.0000 mg | ORAL_CAPSULE | ORAL | 0 refills | Status: DC

## 2019-10-28 MED ORDER — SERTRALINE HCL 100 MG PO TABS: 200.0000 mg | ORAL_TABLET | Freq: Every day | ORAL | 0 refills | Status: DC

## 2019-10-28 NOTE — Patient Instructions (Addendum)
1. Decrease sertraline 200 mg daily  2  Decrease xanax 0.25 mg daily as needed for anxiety 3. ContinueAbilify 5mg  at night 4. Adderall XR 10 mg daily is ordered for a month 5. Your care will be transferred to another psychiatrist.    CONTACT INFORMATION  What to do if you need to get in touch with someone regarding a psychiatric issue:  1. EMERGENCY: For psychiatric emergencies (if you are suicidal or if there are any other safety issues) call 911 and/or go to your nearest Emergency Room immediately.   2. IF YOU NEED SOMEONE TO TALK TO RIGHT NOW: Given my clinical responsibilities, I may not be able to speak with you over the phone for a prolonged period of time.  a. You may always call The National Suicide Prevention Lifeline at 1-800-273-TALK 305-283-4243).  b. Your county of residence will also have local crisis services. For [facility]: [facility] at [phone] (24 Hour Crisis Hotline)

## 2019-11-02 ENCOUNTER — Other Ambulatory Visit: Payer: Self-pay | Admitting: Nurse Practitioner

## 2019-11-02 DIAGNOSIS — K2100 Gastro-esophageal reflux disease with esophagitis, without bleeding: Secondary | ICD-10-CM

## 2019-11-02 NOTE — Telephone Encounter (Signed)
Limited Rx sent in last month as she had not been seen since 2018. She cancelled her appointment. She needs OV for further refills.

## 2019-11-03 NOTE — Telephone Encounter (Signed)
She is scheduled for a follow up in the next few weeks

## 2019-11-05 ENCOUNTER — Ambulatory Visit: Payer: Self-pay | Admitting: Gastroenterology

## 2019-11-07 ENCOUNTER — Other Ambulatory Visit: Payer: Self-pay | Admitting: Nurse Practitioner

## 2019-11-07 DIAGNOSIS — K2100 Gastro-esophageal reflux disease with esophagitis, without bleeding: Secondary | ICD-10-CM

## 2019-11-08 NOTE — Telephone Encounter (Signed)
Needs to have office visit prior to any additional refills. I sent in one month supply.

## 2019-11-09 ENCOUNTER — Other Ambulatory Visit: Payer: Self-pay | Admitting: Family Medicine

## 2019-11-09 NOTE — Telephone Encounter (Signed)
PATIENT ON THE SCHEDULE FOR FOLLOW UP

## 2019-11-11 ENCOUNTER — Other Ambulatory Visit: Payer: Self-pay | Admitting: *Deleted

## 2019-11-15 ENCOUNTER — Other Ambulatory Visit: Payer: Self-pay | Admitting: *Deleted

## 2019-11-15 ENCOUNTER — Encounter: Payer: Self-pay | Admitting: Internal Medicine

## 2019-11-15 ENCOUNTER — Telehealth: Payer: Self-pay | Admitting: Internal Medicine

## 2019-11-15 ENCOUNTER — Ambulatory Visit: Payer: Self-pay | Admitting: Gastroenterology

## 2019-11-15 MED ORDER — CYCLOBENZAPRINE HCL 10 MG PO TABS: 10.0000 mg | ORAL_TABLET | Freq: Three times a day (TID) | ORAL | 0 refills | Status: DC | PRN

## 2019-11-15 NOTE — Telephone Encounter (Signed)
Patient was a no show and letter sent  °

## 2019-11-18 ENCOUNTER — Ambulatory Visit (HOSPITAL_COMMUNITY): Payer: Medicaid Other | Admitting: Licensed Clinical Social Worker

## 2019-11-19 ENCOUNTER — Other Ambulatory Visit: Payer: Self-pay | Admitting: Family Medicine

## 2019-11-19 DIAGNOSIS — I152 Hypertension secondary to endocrine disorders: Secondary | ICD-10-CM

## 2019-11-19 DIAGNOSIS — E1159 Type 2 diabetes mellitus with other circulatory complications: Secondary | ICD-10-CM

## 2019-11-19 NOTE — Telephone Encounter (Signed)
Patient needs to be seen.

## 2019-11-29 ENCOUNTER — Encounter: Payer: Self-pay | Admitting: Gastroenterology

## 2019-11-29 NOTE — Progress Notes (Deleted)
Primary Care Physician: Dettinger, Elige Radon, MD  Primary Gastroenterologist:    No chief complaint on file.   HPI: Heidi [name] is a 29 y.o. female here for follow-up.  She was last seen in September 2018.  History of reflux esophagitis, hemorrhoids, abnormal LFTs.  Mildly elevated alkaline phosphatase of 143, ALT of 45.  CT abdomen pelvis with contrast December 2018 showing fatty liver, mild splenomegaly.      Current Outpatient Medications  Medication Sig Dispense Refill  . albuterol (PROVENTIL HFA;VENTOLIN HFA) 108 (90 Base) MCG/ACT inhaler Inhale 2 puffs into the lungs every 6 (six) hours as needed for wheezing or shortness of breath. 1 Inhaler 2  . ALPRAZolam (XANAX) 0.25 MG tablet Take 0.5-1 tablets (0.125-0.25 mg total) by mouth 2 (two) times daily as needed for anxiety (depends on anxiety if takes 05-0 tablet). 15 tablet 2  . ALPRAZolam (XANAX) 0.25 MG tablet Take 1 tablet (0.25 mg total) by mouth 2 (two) times daily as needed for anxiety. 60 tablet 1  . amLODipine (NORVASC) 5 MG tablet Take 1 tablet (5 mg total) by mouth daily. 90 tablet 0  . amphetamine-dextroamphetamine (ADDERALL XR) 10 MG 24 hr capsule Take 1 capsule (10 mg total) by mouth every morning. 30 capsule 0  . ARIPiprazole (ABILIFY) 10 MG tablet Take 1 tablet (10 mg total) by mouth daily. 90 tablet 0  . cyclobenzaprine (FLEXERIL) 10 MG tablet Take 1 tablet (10 mg total) by mouth 3 (three) times daily as needed. for muscle spams 30 tablet 0  . DEXILANT 60 MG capsule TAKE 1 CAPSULE BY MOUTH ONCE DAILY (ENDOSCOPIC  FAILURE  ON  NEXIUM.  POOR  CONTROL  OF  SYMPTOMS  ON  PANTOPRAZOLE) 30 capsule 0  . glucose blood (ACCU-CHEK GUIDE) test strip Use to check BG once daily at varying times of day. 100 each 3  . Lancets 30G MISC Dispense Accu-Chek guide lancets.  Check blood sugar at varying times once per day. 100 each 11  . lisinopril-hydrochlorothiazide (ZESTORETIC) 20-12.5 MG tablet Take 1 tablet by mouth once daily  30 tablet 0  . meloxicam (MOBIC) 7.5 MG tablet Take 2 tablets by mouth once daily 180 tablet 0  . metFORMIN (GLUCOPHAGE) 1000 MG tablet TAKE 1 TABLET BY MOUTH TWICE DAILY WITH A MEAL 180 tablet 0  . metroNIDAZOLE (FLAGYL) 500 MG tablet Take 1 tablet (500 mg total) by mouth 2 (two) times daily. 14 tablet 0  . sertraline (ZOLOFT) 100 MG tablet Take 2 tablets (200 mg total) by mouth daily. 60 tablet 0  . traZODone (DESYREL) 50 MG tablet Take 1 tablet (50 mg total) by mouth at bedtime as needed for sleep. 30 tablet 2   No current facility-administered medications for this visit.    Allergies as of 11/30/2019 - Review Complete 10/28/2019  Allergen Reaction Noted  . Hydroxyzine  10/23/2016  . Linzess [linaclotide] Hives 03/13/2017  . Lorazepam  11/29/2015  . Morphine and related Other (See Comments) 08/31/2014  . Quetiapine  11/29/2015    ROS:  General: Negative for anorexia, weight loss, fever, chills, fatigue, weakness. ENT: Negative for hoarseness, difficulty swallowing , nasal congestion. CV: Negative for chest pain, angina, palpitations, dyspnea on exertion, peripheral edema.  Respiratory: Negative for dyspnea at rest, dyspnea on exertion, cough, sputum, wheezing.  GI: See history of present illness. GU:  Negative for dysuria, hematuria, urinary incontinence, urinary frequency, nocturnal urination.  Endo: Negative for unusual weight change.    Physical Examination:  LMP 05/28/2017 Comment: [place]  General: Well-nourished, well-developed in no acute distress.  Eyes: No icterus. Mouth: Oropharyngeal mucosa moist and pink , no lesions erythema or exudate. Lungs: Clear to auscultation bilaterally.  Heart: Regular rate and rhythm, no murmurs rubs or gallops.  Abdomen: Bowel sounds are normal, nontender, nondistended, no hepatosplenomegaly or masses, no abdominal bruits or hernia , no rebound or guarding.   Extremities: No lower extremity edema. No clubbing or deformities. Neuro: Alert  and oriented x 4   Skin: Warm and dry, no jaundice.   Psych: Alert and cooperative, normal mood and affect.  Labs:  ***  Imaging Studies: No results found.

## 2019-11-30 ENCOUNTER — Ambulatory Visit: Payer: Self-pay | Admitting: Gastroenterology

## 2019-11-30 ENCOUNTER — Encounter: Payer: Self-pay | Admitting: Internal Medicine

## 2019-11-30 ENCOUNTER — Telehealth: Payer: Self-pay | Admitting: Internal Medicine

## 2019-11-30 NOTE — Telephone Encounter (Signed)
PATIENT WAS A NO SHOW AND LETTER SENT  °

## 2019-12-02 ENCOUNTER — Ambulatory Visit (INDEPENDENT_AMBULATORY_CARE_PROVIDER_SITE_OTHER): Payer: Medicaid Other | Admitting: Licensed Clinical Social Worker

## 2019-12-02 ENCOUNTER — Other Ambulatory Visit: Payer: Self-pay

## 2019-12-02 DIAGNOSIS — F331 Major depressive disorder, recurrent, moderate: Secondary | ICD-10-CM

## 2019-12-02 NOTE — Progress Notes (Signed)
Virtual Visit via Video Note  I connected with Heidi [name] on 12/02/19 at  9:00 AM EDT by a video enabled telemedicine application and verified that I am speaking with the correct person using two identifiers.  Location: Patient: Home Provider: Office   I discussed the limitations of evaluation and management by telemedicine and the availability of in person appointments. The patient expressed understanding and agreed to proceed.   THERAPIST PROGRESS NOTE  Session Time: 9:00 am -9:40 am  Participation Level: Active  Behavioral Response: CasualAlertDepressed  Type of Therapy: Individual Therapy  Treatment Goals addressed: Coping  Interventions: CBT and Solution Focused  Case Summary: Heidi [name] is a 29 y.o. female who presents oriented x5 (person, place, situation, time, and object), casually dressed, appropriately groomed, average heigh, overweight, and cooperative to address mood. Patient has a history of medical treatment including diabetes, GERD, and hypertension. Patient has a history of mental health treatment including outpatient therapy and medication management. Patient denies suicidal and homicidal ideations. Patient denies psychosis including auditory and visual hallucinations. Patient denies substance abuse. She is at low risk for lethality at this time.  Physically: Patient continues to feel fatigued, her sleep fluctuates, and she has low activity level. Patient has not got shots in her knees and she is experiencing chronic pain due to this.  Spiritually/values: No issues identified.  Relationships: Patient's relationship with her boyfriend is still going well. She has a good relationship with her son. Patient's father moved out of the home and this has eased the tension she is experiencing.  Emotionally/Mentally/Behavior:  Patient's anxiety has increased. She has had difficulty leaving the home at times but is able to get out of the home and have a good time. Patient  was able to identify the worst case scenario: getting into a car accident, breaking down and not having service, the best case scenario: potentially having fun, and the typical outcome would be that nothing happens. Patient understood that she needs to calm her body which activates her "lizard brain" or the irrational part of her brain. Patient has used breathing, self talk, and understood she needs to "lean into" her anxiety rather than avoid it. Patient was able to clean the bathroom in her home which she felt good about and was out of character for her. She hopes to do more cleaning, etc around the house.   Patient engaged in session. She responded well to interventions. Patient continues to meet criteria for MDD (major depressive disorder), recurrent episode, moderate. Patient will continue in outpatient therapy due to being the least restrictive service to meet her needs. Patient made minimal progress on her goals.    Suicidal/Homicidal: Negativewithout intent/plan  Therapist Response: Therapist reviewed patient's recent thoughts and behaviors. Therapist utilized CBT to address mood. Therapist processed patient's feelings to identify triggers for mood. Therapist discussed with patient her thoughts that trigger her "lizard brain"/irrational brain and how to calm the body to calm the mind.    Plan: Return again in 4 weeks.  Diagnosis: Axis I: MDD (major depressive disorder), recurrent episode, moderate    Axis II: No diagnosis   I discussed the assessment and treatment plan with the patient. The patient was provided an opportunity to ask questions and all were answered. The patient agreed with the plan and demonstrated an understanding of the instructions.   The patient was advised to call back or seek an in-person evaluation if the symptoms worsen or if the condition fails to improve as anticipated.  I  provided 45 minutes of non-face-to-face time during this encounter.    Bynum Bellows,  LCSW 12/02/2019

## 2019-12-21 ENCOUNTER — Encounter: Payer: Self-pay | Admitting: Adult Health

## 2019-12-21 ENCOUNTER — Ambulatory Visit: Payer: Medicaid Other | Admitting: Adult Health

## 2019-12-21 ENCOUNTER — Other Ambulatory Visit: Payer: Self-pay

## 2019-12-21 VITALS — BP 138/89 | HR 101 | Ht 67.0 in | Wt 290.0 lb

## 2019-12-21 DIAGNOSIS — N898 Other specified noninflammatory disorders of vagina: Secondary | ICD-10-CM | POA: Diagnosis not present

## 2019-12-21 DIAGNOSIS — B379 Candidiasis, unspecified: Secondary | ICD-10-CM | POA: Diagnosis not present

## 2019-12-21 MED ORDER — FLUCONAZOLE 100 MG PO TABS: ORAL_TABLET | ORAL | 0 refills | Status: DC

## 2019-12-21 NOTE — Progress Notes (Signed)
  Subjective:     Patient ID: Heidi [name], female   DOB: 1991-09-02, 29 y.o.   MRN: [id number]  HPI Heidi [name] is a 29 year old white female,divorced, but has 2 sex partners, sp [facility], in complaining of vaginal pain and itching PCP is Dr Museum/gallery exhibitions officer.   Review of Systems Has pain and itching in vaginal area for about a month, took diflucan and got better then was back Feels like cuts  Reviewed past medical,surgical, social and family history. Reviewed medications and allergies.     Objective:   Physical Exam BP 138/89 (BP Location: Left Arm, Patient Position: Sitting, Cuff Size: Large)   Pulse (!) 101   Ht 5\' 7"  (1.702 m)   Wt 290 lb (131.5 kg)   LMP 05/28/2017 Comment: [facility]  BMI 45.42 kg/m fall risk is low Skin warm and dry.Pelvic: external genitalia is red and slightly swollen,has white clumpy discharge in clitoral area,has hair bump on mons pubis,  vagina: white discharge without odor,urethra has no lesions or masses noted, cervix:smooth and bulbous, uterus: absent, adnexa: no masses, slight RLQ tenderness noted. Bladder is non tender and no masses felt. Painted vulva and vagina to rectal area with gentian violet.  Nuswab obtained Examination chaperoned by 05/30/2017 LPN    Assessment:     1. Vaginal discharge [place] sent   2. Yeast infection Painted with gentian violet Will rx diflucan Meds ordered this encounter  Medications  . fluconazole (DIFLUCAN) 100 MG tablet    Sig: Take 1 daily for 10 days    Dispense:  10 tablet    Refill:  0    Order Specific Question:   Supervising Provider    Answer:   Malachy Mood H [2510]  Do not shave Get A1c to 7 or below, sop stop sodas, need to get blood sugars down  Do not wear thongs for now    Plan:     Follow  Up in 3 weeks for pap and physical with me and recheck

## 2019-12-23 LAB — NUSWAB VAGINITIS PLUS (VG+)
Candida albicans, NAA: POSITIVE — AB
Candida glabrata, NAA: POSITIVE — AB
Chlamydia trachomatis, NAA: NEGATIVE
Neisseria gonorrhoeae, NAA: NEGATIVE
Trich vag by NAA: NEGATIVE

## 2019-12-24 ENCOUNTER — Telehealth (INDEPENDENT_AMBULATORY_CARE_PROVIDER_SITE_OTHER): Payer: Medicaid Other | Admitting: Family Medicine

## 2019-12-24 ENCOUNTER — Encounter: Payer: Self-pay | Admitting: Family Medicine

## 2019-12-24 DIAGNOSIS — E1159 Type 2 diabetes mellitus with other circulatory complications: Secondary | ICD-10-CM | POA: Diagnosis not present

## 2019-12-24 DIAGNOSIS — E1169 Type 2 diabetes mellitus with other specified complication: Secondary | ICD-10-CM | POA: Diagnosis not present

## 2019-12-24 DIAGNOSIS — I10 Essential (primary) hypertension: Secondary | ICD-10-CM

## 2019-12-24 DIAGNOSIS — K2100 Gastro-esophageal reflux disease with esophagitis, without bleeding: Secondary | ICD-10-CM | POA: Diagnosis not present

## 2019-12-24 MED ORDER — METFORMIN HCL 1000 MG PO TABS: 1000.0000 mg | ORAL_TABLET | Freq: Two times a day (BID) | ORAL | 3 refills | Status: AC

## 2019-12-24 MED ORDER — OZEMPIC (0.25 OR 0.5 MG/DOSE) 2 MG/1.5ML ~~LOC~~ SOPN: 0.5000 mg | PEN_INJECTOR | SUBCUTANEOUS | 3 refills | Status: DC

## 2019-12-24 NOTE — Progress Notes (Signed)
Virtual Visit via MyChart video note  I connected with Heidi [name] on 12/24/19 at 1442 by video and verified that I am speaking with the correct person using two identifiers. Heidi [name] is currently located at home and no other people are currently with her during visit. The provider, Fransisca Kaufmann Caya Soberanis, MD is located in their office at time of visit.  Call ended at 1459  I discussed the limitations, risks, security and privacy concerns of performing an evaluation and management service by video and the availability of in person appointments. I also discussed with the patient that there may be a patient responsible charge related to this service. The patient expressed understanding and agreed to proceed.   History and Present Illness: Type 2 diabetes mellitus Patient comes in today for recheck of his diabetes. Patient has been currently taking metformin and bloods sugar was 200-300. Patient is currently on an ACE inhibitor/ARB. Patient has not seen an ophthalmologist this year. Patient denies any issues with their feet.   Hypertension Patient is currently on lisinopril hctz, and their blood pressure today is unknown. Patient denies any lightheadedness or dizziness. Patient denies headaches, blurred vision, chest pains, shortness of breath, or weakness. Denies any side effects from medication and is content with current medication.   GERD Patient is currently on no medication.  She denies any major symptoms or abdominal pain or belching or burping. She denies any blood in her stool or lightheadedness or dizziness.   No diagnosis found.  Outpatient Encounter Medications as of 12/24/2019  Medication Sig  . albuterol (PROVENTIL HFA;VENTOLIN HFA) 108 (90 Base) MCG/ACT inhaler Inhale 2 puffs into the lungs every 6 (six) hours as needed for wheezing or shortness of breath.  . ALPRAZolam (XANAX) 0.25 MG tablet Take 1 tablet (0.25 mg total) by mouth 2 (two) times daily as needed for anxiety.   Marland Kitchen amLODipine (NORVASC) 5 MG tablet Take 1 tablet (5 mg total) by mouth daily.  . celecoxib (CELEBREX) 100 MG capsule Take by mouth.  . cyclobenzaprine (FLEXERIL) 10 MG tablet Take 1 tablet (10 mg total) by mouth 3 (three) times daily as needed. for muscle spams  . fluconazole (DIFLUCAN) 100 MG tablet Take 1 daily for 10 days  . glucose blood (ACCU-CHEK GUIDE) test strip Use to check BG once daily at varying times of day.  . Lancets 30G MISC Dispense Accu-Chek guide lancets.  Check blood sugar at varying times once per day.  . lisinopril-hydrochlorothiazide (ZESTORETIC) 20-12.5 MG tablet Take 1 tablet by mouth once daily  . metFORMIN (GLUCOPHAGE) 1000 MG tablet TAKE 1 TABLET BY MOUTH TWICE DAILY WITH A MEAL  . sertraline (ZOLOFT) 100 MG tablet Take 2 tablets (200 mg total) by mouth daily. (Patient taking differently: Take 100 mg by mouth daily. )   No facility-administered encounter medications on file as of 12/24/2019.    Review of Systems  Constitutional: Negative for chills and fever.  Eyes: Negative for visual disturbance.  Respiratory: Negative for chest tightness and shortness of breath.   Cardiovascular: Negative for chest pain and leg swelling.  Musculoskeletal: Negative for back pain and gait problem.  Skin: Negative for rash.  Neurological: Negative for light-headedness and headaches.  Psychiatric/Behavioral: Negative for agitation and behavioral problems.  All other systems reviewed and are negative.   Observations/Objective: Patient sounds comfortable and in no acute distress  Assessment and Plan: Problem List Items Addressed This Visit      Cardiovascular and Mediastinum   Hypertension associated with diabetes (  [place])   Relevant Medications   metFORMIN (GLUCOPHAGE) 1000 MG tablet   Semaglutide,0.25 or 0.5MG/DOS, (OZEMPIC, 0.25 OR 0.5 MG/DOSE,) 2 MG/1.5ML SOPN   Other Relevant Orders   Bayer DCA Hb A1c Waived   CMP14+EGFR   CBC with Differential/Platelet   TSH    Lipid panel     Digestive   GERD (gastroesophageal reflux disease)   Relevant Orders   Bayer DCA Hb A1c Waived   CMP14+EGFR   CBC with Differential/Platelet   TSH   Lipid panel     Endocrine   Type 2 diabetes mellitus (HCC) - Primary   Relevant Medications   metFORMIN (GLUCOPHAGE) 1000 MG tablet   Semaglutide,0.25 or 0.5MG/DOS, (OZEMPIC, 0.25 OR 0.5 MG/DOSE,) 2 MG/1.5ML SOPN   Other Relevant Orders   Bayer DCA Hb A1c Waived   CMP14+EGFR   CBC with Differential/Platelet   TSH   Lipid panel      Start ozempic, and get blood sugars down and help with neuropathy Follow up plan: Return in about 3 months (around 03/24/2020), or if symptoms worsen or fail to improve, for diabetes and htn.     I discussed the assessment and treatment plan with the patient. The patient was provided an opportunity to ask questions and all were answered. The patient agreed with the plan and demonstrated an understanding of the instructions.   The patient was advised to call back or seek an in-person evaluation if the symptoms worsen or if the condition fails to improve as anticipated.  The above assessment and management plan was discussed with the patient. The patient verbalized understanding of and has agreed to the management plan. Patient is aware to call the clinic if symptoms persist or worsen. Patient is aware when to return to the clinic for a follow-up visit. Patient educated on when it is appropriate to go to the emergency department.    I provided 17 minutes of non-face-to-face time during this encounter.    Worthy Rancher, MD

## 2019-12-27 ENCOUNTER — Telehealth: Payer: Self-pay | Admitting: *Deleted

## 2019-12-27 NOTE — Telephone Encounter (Signed)
 Tracks PA started for pt Med: Ozempic pen inj Key: [id number]  Ozempic is NON-Preferred with MEDICAID  She MUST try and fail 2 of the following 3 before this would be covered:  Byetta Bydureon Victoza  I do not see any of these on her med history.  Please forward back to PA pool when notes / changes have been made.

## 2019-12-28 MED ORDER — BYDUREON BCISE 2 MG/0.85ML ~~LOC~~ AUIJ: 2.0000 mg | AUTO-INJECTOR | SUBCUTANEOUS | 3 refills | Status: DC

## 2019-12-28 NOTE — Telephone Encounter (Signed)
I sent Bydureon for the patient, it should be once a week

## 2019-12-28 NOTE — Telephone Encounter (Signed)
Patient aware.

## 2019-12-29 ENCOUNTER — Ambulatory Visit: Payer: Self-pay | Admitting: Pharmacist

## 2020-01-05 ENCOUNTER — Ambulatory Visit: Payer: Medicaid Other | Admitting: Family Medicine

## 2020-01-11 ENCOUNTER — Other Ambulatory Visit: Payer: Medicaid Other | Admitting: Adult Health

## 2020-01-12 ENCOUNTER — Telehealth: Payer: Self-pay | Admitting: Pharmacist

## 2020-01-12 ENCOUNTER — Ambulatory Visit: Payer: Self-pay | Admitting: Pharmacist

## 2020-01-12 NOTE — Telephone Encounter (Signed)
  Left message with patient requesting call back to PharmD to discuss new medication for T2DM (Bydureon-preferred GLP1), weight loss, etc.  Will await call back

## 2020-01-12 NOTE — Progress Notes (Unsigned)
Left VM with patient requesting call back to discuss new medication (Bydureon vs Ozempic), T2DM & weight loss.  Will f/u in 3-5 business days if call not returned  Kieth Brightly, PharmD, BCPS Clinical Pharmacist, [facility]  II Phone [phone]

## 2020-01-14 ENCOUNTER — Other Ambulatory Visit: Payer: Self-pay | Admitting: Family Medicine

## 2020-01-14 DIAGNOSIS — E1159 Type 2 diabetes mellitus with other circulatory complications: Secondary | ICD-10-CM

## 2020-01-17 ENCOUNTER — Telehealth: Payer: Self-pay | Admitting: Pharmacist

## 2020-01-17 NOTE — Telephone Encounter (Signed)
  Incoming call from patient stating Heidi [name] has still not filled her medication.  Bydureon BCISE pen requiring PA from insurance (medicaid) via [place] tracks.    PA submitted via [place] tracks for Bydureon Confirmation #[id number]  STATUS: Pending  appt made with Jannifer Rodney, FNP for 02/03/20 Will f/u with patient at that time

## 2020-01-18 NOTE — Telephone Encounter (Signed)
PA denied for [facility], however no reason given--ticket created to determine reason for denial in order to re-submit for approval.  Decision should be made in 24-48hrs.  Will f/u in 2 days  Ticket Reference# [id number]

## 2020-01-18 NOTE — Telephone Encounter (Signed)
   Per [place] medicaid PDL 2021 below --> Bydureon BCISE not covered, must try and fail Victoza or Byetta before once weekly GLP1 available  Will route to PCP for approval/selection (victoza)

## 2020-01-19 MED ORDER — VICTOZA 18 MG/3ML ~~LOC~~ SOPN: PEN_INJECTOR | SUBCUTANEOUS | 2 refills | Status: DC

## 2020-01-19 NOTE — Telephone Encounter (Signed)
Victoza prescription sent in to [facility]--covered under insurance Start titration-0.6mg  daily for 7 days, then increase to 1.2mg  daily Plan to f/u with patient at upcoming PCP visit

## 2020-01-19 NOTE — Addendum Note (Signed)
Addended by: Vanice Sarah D on: 01/19/2020 12:23 PM   Modules accepted: Orders

## 2020-01-21 ENCOUNTER — Telehealth: Payer: Self-pay | Admitting: Family Medicine

## 2020-01-21 NOTE — Telephone Encounter (Signed)
Please advise 

## 2020-01-21 NOTE — Telephone Encounter (Signed)
Aware of provider's statement. She was able to get both scripts for now.  She will let us know which medication she will use.

## 2020-01-21 NOTE — Telephone Encounter (Signed)
Pt called stating that Dr Dettinger sent 3 Rxs to the pharmacy for weekly injections. Pt says the pharmacy only filled 2 of the 3 Rxs (Victoza and Bydureon). Wants to know if she is supposed to use both or just one at a time.

## 2020-01-21 NOTE — Telephone Encounter (Signed)
Bydureon already switched to Victoza ( no furter PA work up for [name])

## 2020-01-21 NOTE — Telephone Encounter (Signed)
Now that was more because of coverage, she can either take the Victoza or the Bydureon, the Bydureon is once a week and the Victoza is once a day but do not take both just one or the other. This is mainly because we got a message back that one of them was not covered so we sent the next 1 but they must of ended up filling it. I do not care which she chooses to take, they are both essentially equivalent. Arville Care, MD [facility] 01/21/2020, 1:11 PM

## 2020-02-01 ENCOUNTER — Telehealth: Payer: Self-pay | Admitting: Family Medicine

## 2020-02-01 NOTE — Telephone Encounter (Signed)
Pt called requesting to speak with Heidi [name] regarding the Victoza Rx that she has been taking for about a week. Says the medicine is making her feel "weird."

## 2020-02-02 NOTE — Telephone Encounter (Signed)
Call placed to patient to f/u.  She reports: FBG 212  FBG 222  Just started Victoza 0.6mg --instructed pt to continue 0.6mg  for another week and then increase to 1.2mg  per day.  Continue metformin.  She states she feels "different", but it is likely the BG decreasing.  She denies BG<70 or BG>250.  She will f/u with PCP tomorrow.  She denies any S/Sx of allergic RXN.

## 2020-02-03 ENCOUNTER — Ambulatory Visit (INDEPENDENT_AMBULATORY_CARE_PROVIDER_SITE_OTHER): Payer: Medicaid Other | Admitting: Family

## 2020-02-03 ENCOUNTER — Encounter: Payer: Self-pay | Admitting: Family

## 2020-02-03 DIAGNOSIS — F410 Panic disorder [episodic paroxysmal anxiety] without agoraphobia: Secondary | ICD-10-CM

## 2020-02-03 DIAGNOSIS — F419 Anxiety disorder, unspecified: Secondary | ICD-10-CM

## 2020-02-03 DIAGNOSIS — F332 Major depressive disorder, recurrent severe without psychotic features: Secondary | ICD-10-CM

## 2020-02-03 DIAGNOSIS — F331 Major depressive disorder, recurrent, moderate: Secondary | ICD-10-CM | POA: Diagnosis not present

## 2020-02-03 MED ORDER — DULOXETINE HCL 60 MG PO CPEP: 60.0000 mg | ORAL_CAPSULE | Freq: Every day | ORAL | 1 refills | Status: DC

## 2020-02-03 MED ORDER — BUSPIRONE HCL 15 MG PO TABS: 15.0000 mg | ORAL_TABLET | Freq: Three times a day (TID) | ORAL | 2 refills | Status: AC

## 2020-02-03 MED ORDER — DULOXETINE HCL 30 MG PO CPEP: ORAL_CAPSULE | ORAL | 0 refills | Status: DC

## 2020-02-03 NOTE — Addendum Note (Signed)
Addended by: Jannifer Rodney A on: 02/03/2020 12:16 PM   Modules accepted: Orders

## 2020-02-03 NOTE — Progress Notes (Addendum)
Virtual Visit via telephone Note Due to COVID-19 pandemic this visit was conducted virtually. This visit type was conducted due to national recommendations for restrictions regarding the COVID-19 Pandemic (e.g. social distancing, sheltering in place) in an effort to limit this patient's exposure and mitigate transmission in our community. All issues noted in this document were discussed and addressed.  A physical exam was not performed with this format.  I connected with Heidi [name] on 02/03/20 at 11:50 AM by telephone and verified that I am speaking with the correct person using two identifiers. Heidi [name] is currently located at home and boyfriend  is currently with her during visit. The provider, Jannifer Rodney, FNP is located in their office at time of visit.  I discussed the limitations, risks, security and privacy concerns of performing an evaluation and management service by telephone and the availability of in person appointments. I also discussed with the patient that there may be a patient responsible charge related to this service. The patient expressed understanding and agreed to proceed.   History and Present Illness:  Pt calls the office with increased anxiety and depression. She reports she has been out of her medication of her Cymbalta and Lamictal.   She states her sister died last year and her anxiety and depression is worse. She had been going to [facility] and states she had marijuana in her system and she was told they could not longer prescribe her medications.   She reports she has been diagnosed with panic attacks and ADHD.  She reports the only thing that helps with her panic attacks is xanax. Discussed I could not prescribe this medication for her.  Anxiety Presents for follow-up visit. Symptoms include decreased concentration, depressed mood, excessive worry, insomnia, irritability, malaise, nervous/anxious behavior, palpitations, panic and restlessness. Symptoms occur  constantly. The severity of symptoms is causing significant distress. The quality of sleep is good.    Depression        This is a chronic problem.  The current episode started more than 1 year ago.   The onset quality is gradual.   The problem occurs intermittently.  The problem has been waxing and waning since onset.  Associated symptoms include decreased concentration, fatigue, helplessness, hopelessness, insomnia, irritable, restlessness and sad.     The symptoms are aggravated by family issues.  Past treatments include nothing.  Compliance with treatment is good.  Past medical history includes anxiety.       Review of Systems  Constitutional: Positive for fatigue and irritability.  Cardiovascular: Positive for palpitations.  Psychiatric/Behavioral: Positive for decreased concentration and depression. The patient is nervous/anxious and has insomnia.      Observations/Objective: Pt is crying and sobbing  Assessment and Plan: 1. Anxiety  2. MDD (major depressive disorder), recurrent episode, moderate (HCC)  3. Severe episode of recurrent major depressive disorder, without psychotic features (HCC)  Pt requesting xanax. I told I could not prescribe this medication and she would have to follow up with her PCP. I did tell her I could restart her Cymbalta or Lamictal  She states she does not know if she wants to restart these. I offered Vistaril or Buspar and she states "Vistaril laughs in my face" and then states this is not helping her anxiety and hangs up.      Addendum: Pt's boyfriend calls back and patient on speaker. States she lost her "cool" and would like to try Cymbalta and Buspar. Will start Cymbalta at  30 mg for two  weeks then increase to 60 mg. Can take Buspar as needed. Will place referral to [facility].    I discussed the assessment and treatment plan with the patient. The patient was provided an opportunity to ask questions and all were answered. The patient  agreed with the plan and demonstrated an understanding of the instructions.   The patient was advised to call back or seek an in-person evaluation if the symptoms worsen or if the condition fails to improve as anticipated.  The above assessment and management plan was discussed with the patient. The patient verbalized understanding of and has agreed to the management plan. Patient is aware to call the clinic if symptoms persist or worsen. Patient is aware when to return to the clinic for a follow-up visit. Patient educated on when it is appropriate to go to the emergency department.   Time call ended: 12:04 pm     I provided 14 minutes of non-face-to-face time during this encounter.    Evelina Dun, FNP

## 2020-02-18 ENCOUNTER — Other Ambulatory Visit: Payer: Self-pay | Admitting: Family Medicine

## 2020-02-21 ENCOUNTER — Telehealth (INDEPENDENT_AMBULATORY_CARE_PROVIDER_SITE_OTHER): Payer: Medicaid Other | Admitting: Family Medicine

## 2020-02-21 DIAGNOSIS — J069 Acute upper respiratory infection, unspecified: Secondary | ICD-10-CM | POA: Diagnosis not present

## 2020-02-21 DIAGNOSIS — Z20822 Contact with and (suspected) exposure to covid-19: Secondary | ICD-10-CM | POA: Diagnosis not present

## 2020-02-21 MED ORDER — DEXAMETHASONE 4 MG PO TABS: 4.0000 mg | ORAL_TABLET | Freq: Every day | ORAL | 0 refills | Status: AC

## 2020-02-21 MED ORDER — BENZONATATE 200 MG PO CAPS: 200.0000 mg | ORAL_CAPSULE | Freq: Two times a day (BID) | ORAL | 0 refills | Status: DC | PRN

## 2020-02-21 MED ORDER — GUAIFENESIN-CODEINE 100-10 MG/5ML PO SYRP: 5.0000 mL | ORAL_SOLUTION | Freq: Three times a day (TID) | ORAL | 0 refills | Status: DC | PRN

## 2020-02-21 NOTE — Patient Instructions (Signed)

## 2020-02-21 NOTE — Progress Notes (Signed)
MyChart Video visit  Subjective: CC: URI PCP: Dettinger, Heidi Kaufmann, MD [id number] [name] is a 29 y.o. female. Patient provides verbal consent for consult held via video.  Due to COVID-19 pandemic this visit was conducted virtually. This visit type was conducted due to national recommendations for restrictions regarding the COVID-19 Pandemic (e.g. social distancing, sheltering in place) in an effort to limit this patient's exposure and mitigate transmission in our community. All issues noted in this document were discussed and addressed.  A physical exam was not performed with this format.   Location of patient: Home Location of provider: [facility] Others present for call: Boyfriend  1.  URI Patient reports onset of symptoms yesterday.  She started with a mildly productive cough that turned into a sore throat.  She has mild nasal congestion, itchy throat.  No fevers.  She has not used anything for her symptoms.  Her boyfriend is sick with similar.  No other known sick contacts.  She has not been vaccinated against [place].  LMP: had hysterectomy.  No hemoptysis.  Mild shortness of breath with coughing spells.  ROS: Per HPI  Allergies  Allergen Reactions  . Hydroxyzine     Causes her more anxiety.  Rolan Lipa [Linaclotide] Hives  . Lorazepam     Sedation  . Morphine And Related Other (See Comments)    Makes pt.aggressive  . Quetiapine     Sweating, irritability   Past Medical History:  Diagnosis Date  . Abdominal pain 01/10/2016  . ADHD   . Anxiety   . Back pain   . Bloating 01/10/2016  . Depression   . GERD (gastroesophageal reflux disease)   . Hematuria 01/10/2016  . Herpes simplex virus (HSV) infection   . Hypertension   . Osteoarthritis   . Prediabetes   . Schizo-affective schizophrenia ([place])   . Type 2 diabetes mellitus ([place]) 03/28/2016  . Urinary frequency 01/10/2016    Current Outpatient Medications:  .  albuterol (PROVENTIL HFA;VENTOLIN HFA) 108 (90 Base) MCG/ACT inhaler,  Inhale 2 puffs into the lungs every 6 (six) hours as needed for wheezing or shortness of breath., Disp: 1 Inhaler, Rfl: 2 .  amLODipine (NORVASC) 5 MG tablet, Take 1 tablet (5 mg total) by mouth daily., Disp: 90 tablet, Rfl: 0 .  busPIRone (BUSPAR) 15 MG tablet, Take 1 tablet (15 mg total) by mouth 3 (three) times daily., Disp: 90 tablet, Rfl: 2 .  cyclobenzaprine (FLEXERIL) 10 MG tablet, Take 1 tablet by mouth three times daily as needed for muscle spasm, Disp: 30 tablet, Rfl: 0 .  DULoxetine (CYMBALTA) 30 MG capsule, Take 1 capsule (30 mg total) by mouth daily for 14 days, THEN 2 capsules (60 mg total) daily for 14 days., Disp: 42 capsule, Rfl: 0 .  DULoxetine (CYMBALTA) 60 MG capsule, Take 1 capsule (60 mg total) by mouth daily., Disp: 90 capsule, Rfl: 1 .  glucose blood (ACCU-CHEK GUIDE) test strip, Use to check BG once daily at varying times of day., Disp: 100 each, Rfl: 3 .  lamoTRIgine (LAMICTAL) 100 MG tablet, Take 100 mg by mouth daily., Disp: , Rfl:  .  Lancets 30G MISC, Dispense Accu-Chek guide lancets.  Check blood sugar at varying times once per day., Disp: 100 each, Rfl: 11 .  liraglutide (VICTOZA) 18 MG/3ML SOPN, Start 0.6mg  SQ once a day for 7 days, then increase to 1.2mg  once a day, Disp: 2 pen, Rfl: 2 .  lisinopril-hydrochlorothiazide (ZESTORETIC) 20-12.5 MG tablet, Take 1 tablet by mouth daily.,  Disp: 90 tablet, Rfl: 1 .  metFORMIN (GLUCOPHAGE) 1000 MG tablet, Take 1 tablet (1,000 mg total) by mouth 2 (two) times daily with a meal., Disp: 180 tablet, Rfl: 3  GEN: Appears malaised.  Oriented. Pulmonary : Normal work of breathing on room air.  Intermittently coughing.    Assessment/ Plan: 29 y.o. female   1. URI with cough and congestion Concerning for possible COVID-19 infection.  I have advised sheltering at home until she can get tested and this results negative.  In the interim, supportive care with OTC analgesics, Tessalon Perles, Robitussin-AC and Decadron prescribed.   National narcotic database was reviewed.  No red flags.  Home care instructions were reviewed and reasons for emergent evaluation emergency department discussed.  She was good understanding will follow up as needed - guaiFENesin-codeine (ROBITUSSIN AC) 100-10 MG/5ML syrup; Take 5 mLs by mouth 3 (three) times daily as needed for cough.  Dispense: 120 mL; Refill: 0 - benzonatate (TESSALON) 200 MG capsule; Take 1 capsule (200 mg total) by mouth 2 (two) times daily as needed for cough.  Dispense: 20 capsule; Refill: 0 - dexamethasone (DECADRON) 4 MG tablet; Take 1 tablet (4 mg total) by mouth daily for 5 days.  Dispense: 5 tablet; Refill: 0  2. Suspected COVID-19 virus infection - dexamethasone (DECADRON) 4 MG tablet; Take 1 tablet (4 mg total) by mouth daily for 5 days.  Dispense: 5 tablet; Refill: 0   Start time: 12:44pm End time: 12:49pm  Total time spent on patient care (including video visit/ documentation): 10 minutes  Heidi [name] Hulen Skains, DO [facility] [phone]

## 2020-03-09 ENCOUNTER — Telehealth: Payer: Medicaid Other | Admitting: Nurse Practitioner

## 2020-03-09 DIAGNOSIS — K219 Gastro-esophageal reflux disease without esophagitis: Secondary | ICD-10-CM | POA: Diagnosis not present

## 2020-03-09 MED ORDER — OMEPRAZOLE 20 MG PO CPDR: 20.0000 mg | DELAYED_RELEASE_CAPSULE | Freq: Every day | ORAL | 3 refills | Status: DC

## 2020-03-09 NOTE — Progress Notes (Signed)
We are sorry that you are not feeling well.  Here is how we plan to help!  Based on what you shared with me it looks like you most likely have Gastroesophageal Reflux Disease (GERD)  Gastroesophageal reflux disease (GERD) happens when acid from your stomach flows up into the esophagus.  When acid comes in contact with the esophagus, the acid causes sorenss (inflammation) in the esophagus.  Over time, GERD may create small holes (ulcers) in the lining of the esophagus.  I have prescribed Omeprazole 20 mg one by mouth daily until you follow up with a provider.  Your symptoms should improve in the next day or two.  You can use antacids as needed until symptoms resolve.  Call us if your heartburn worsens, you have trouble swallowing, weight loss, spitting up blood or recurrent vomiting.  Home Care:  May include lifestyle changes such as weight loss, quitting smoking and alcohol consumption  Avoid foods and drinks that make your symptoms worse, such as:  Caffeine or alcoholic drinks  Chocolate  Peppermint or mint flavorings  Garlic and onions  Spicy foods  Citrus fruits, such as oranges, lemons, or limes  Tomato-based foods such as sauce, chili, salsa and pizza  Fried and fatty foods  Avoid lying down for 3 hours prior to your bedtime or prior to taking a nap  Eat small, frequent meals instead of a large meals  Wear loose-fitting clothing.  Do not wear anything tight around your waist that causes pressure on your stomach.  Raise the head of your bed 6 to 8 inches with wood blocks to help you sleep.  Extra pillows will not help.  Seek Help Right Away If:  You have pain in your arms, neck, jaw, teeth or back  Your pain increases or changes in intensity or duration  You develop nausea, vomiting or sweating (diaphoresis)  You develop shortness of breath or you faint  Your vomit is green, yellow, black or looks like coffee grounds or blood  Your stool is red, bloody or  black  These symptoms could be signs of other problems, such as heart disease, gastric bleeding or esophageal bleeding.  Make sure you :  Understand these instructions.  Will watch your condition.  Will get help right away if you are not doing well or get worse.  Your e-visit answers were reviewed by a board certified advanced clinical practitioner to complete your personal care plan.  Depending on the condition, your plan could have included both over the counter or prescription medications.  If there is a problem please reply  once you have received a response from your provider.  Your safety is important to us.  If you have drug allergies check your prescription carefully.    You can use MyChart to ask questions about today's visit, request a non-urgent call back, or ask for a work or school excuse for 24 hours related to this e-Visit. If it has been greater than 24 hours you will need to follow up with your provider, or enter a new e-Visit to address those concerns.  You will get an e-mail in the next two days asking about your experience.  I hope that your e-visit has been valuable and will speed your recovery. Thank you for using e-visits.  5-10 minutes spent reviewing and documenting in chart.  

## 2020-03-21 ENCOUNTER — Other Ambulatory Visit: Payer: Self-pay | Admitting: Adult Health

## 2020-03-21 MED ORDER — FLUCONAZOLE 150 MG PO TABS
ORAL_TABLET | ORAL | 1 refills | Status: DC
Start: 2020-03-21 — End: 2020-03-29

## 2020-03-21 NOTE — Progress Notes (Signed)
rx diflucan  

## 2020-03-29 ENCOUNTER — Encounter: Payer: Self-pay | Admitting: Adult Health

## 2020-03-29 ENCOUNTER — Telehealth: Payer: Self-pay | Admitting: Adult Health

## 2020-03-29 ENCOUNTER — Encounter: Payer: Medicaid Other | Admitting: Adult Health

## 2020-03-29 NOTE — Telephone Encounter (Signed)
Pt left before seen, I called and ask her to call me back, and apologized  for the delay.

## 2020-03-30 NOTE — Progress Notes (Signed)
This encounter was created in error - please disregard.

## 2020-04-10 ENCOUNTER — Other Ambulatory Visit: Payer: Self-pay | Admitting: Family

## 2020-04-24 ENCOUNTER — Ambulatory Visit: Payer: Medicaid Other | Admitting: Family Medicine

## 2020-05-17 ENCOUNTER — Other Ambulatory Visit: Payer: Self-pay | Admitting: Family Medicine

## 2020-05-17 ENCOUNTER — Telehealth (INDEPENDENT_AMBULATORY_CARE_PROVIDER_SITE_OTHER): Payer: Medicaid Other | Admitting: Family Medicine

## 2020-05-17 NOTE — Progress Notes (Signed)
Patient did not answer.  

## 2020-05-18 ENCOUNTER — Encounter: Payer: Self-pay | Admitting: Family Medicine

## 2020-05-18 ENCOUNTER — Ambulatory Visit (INDEPENDENT_AMBULATORY_CARE_PROVIDER_SITE_OTHER): Payer: Medicaid Other | Admitting: Family Medicine

## 2020-05-18 DIAGNOSIS — K2100 Gastro-esophageal reflux disease with esophagitis, without bleeding: Secondary | ICD-10-CM

## 2020-05-18 DIAGNOSIS — E1169 Type 2 diabetes mellitus with other specified complication: Secondary | ICD-10-CM

## 2020-05-18 DIAGNOSIS — E1159 Type 2 diabetes mellitus with other circulatory complications: Secondary | ICD-10-CM

## 2020-05-18 DIAGNOSIS — F331 Major depressive disorder, recurrent, moderate: Secondary | ICD-10-CM

## 2020-05-18 DIAGNOSIS — I10 Essential (primary) hypertension: Secondary | ICD-10-CM

## 2020-05-18 DIAGNOSIS — F410 Panic disorder [episodic paroxysmal anxiety] without agoraphobia: Secondary | ICD-10-CM

## 2020-05-18 DIAGNOSIS — I152 Hypertension secondary to endocrine disorders: Secondary | ICD-10-CM

## 2020-05-18 DIAGNOSIS — F419 Anxiety disorder, unspecified: Secondary | ICD-10-CM

## 2020-05-18 DIAGNOSIS — K219 Gastro-esophageal reflux disease without esophagitis: Secondary | ICD-10-CM

## 2020-05-18 DIAGNOSIS — F332 Major depressive disorder, recurrent severe without psychotic features: Secondary | ICD-10-CM

## 2020-05-18 MED ORDER — OMEPRAZOLE 20 MG PO CPDR: 20.0000 mg | DELAYED_RELEASE_CAPSULE | Freq: Every day | ORAL | 3 refills | Status: AC

## 2020-05-18 MED ORDER — VICTOZA 18 MG/3ML ~~LOC~~ SOPN
1.2000 mg | PEN_INJECTOR | Freq: Every day | SUBCUTANEOUS | 3 refills | Status: AC
Start: 2020-05-18 — End: ?

## 2020-05-18 MED ORDER — CYCLOBENZAPRINE HCL 10 MG PO TABS: 10.0000 mg | ORAL_TABLET | Freq: Three times a day (TID) | ORAL | 5 refills | Status: AC | PRN

## 2020-05-18 MED ORDER — DULOXETINE HCL 60 MG PO CPEP: 60.0000 mg | ORAL_CAPSULE | Freq: Every day | ORAL | 3 refills | Status: AC

## 2020-05-18 MED ORDER — LISINOPRIL 20 MG PO TABS: 20.0000 mg | ORAL_TABLET | Freq: Every day | ORAL | 3 refills | Status: AC

## 2020-05-18 NOTE — Progress Notes (Signed)
Virtual Visit via telephone Note  I connected with Heidi [name] on 05/18/20 at 1800 by telephone and verified that I am speaking with the correct person using two identifiers. Heidi [name] is currently located at home and patient are currently with her during visit. The provider, Fransisca Kaufmann Hira Trent, MD is located in their office at time of visit.  Call ended at 1812  I discussed the limitations, risks, security and privacy concerns of performing an evaluation and management service by telephone and the availability of in person appointments. I also discussed with the patient that there may be a patient responsible charge related to this service. The patient expressed understanding and agreed to proceed.   History and Present Illness: Type 2 diabetes mellitus Patient comes in today for recheck of his diabetes. Patient has been currently taking victoza and metformin. Patient is not currently on an ACE inhibitor/ARB. Patient has not seen an ophthalmologist this year. Patient denies any issues with their feet. The symptom started onset as an adult gerd and htn neuropathy ARE RELATED TO DM   GERD Patient is currently on omeprazole.  She denies any major symptoms or abdominal pain or belching or burping. She denies any blood in her stool or lightheadedness or dizziness.   Hypertension Patient is currently on lisinopril, and their blood pressure today is unknown. Patient denies any lightheadedness or dizziness. Patient denies headaches, blurred vision, chest pains, shortness of breath, or weakness. Denies any side effects from medication and is content with current medication.   Takes cymbalta for neuropathy.   Patient's diabetes and htn and gerd are more complicated by the patient's morbid obesity.  Discussed weight loss and lifestyle modification and exercise with the patient.     Outpatient Encounter Medications as of 05/18/2020  Medication Sig  . busPIRone (BUSPAR) 15 MG tablet Take 1  tablet (15 mg total) by mouth 3 (three) times daily.  . celecoxib (CELEBREX) 100 MG capsule TAKE 1 CAPSULE BY MOUTH TWICE DAILY AS NEEDED FOR PAIN FOR UP TO 30 DAYS  . cyclobenzaprine (FLEXERIL) 10 MG tablet Take 1 tablet (10 mg total) by mouth 3 (three) times daily as needed. for muscle spams  . DULoxetine (CYMBALTA) 60 MG capsule Take 1 capsule (60 mg total) by mouth daily.  Marland Kitchen glucose blood (ACCU-CHEK GUIDE) test strip Use to check BG once daily at varying times of day.  . Lancets 30G MISC Dispense Accu-Chek guide lancets.  Check blood sugar at varying times once per day.  . liraglutide (VICTOZA) 18 MG/3ML SOPN Inject 1.2 mg into the skin daily. Start 0.25m SQ once a day for 7 days, then increase to 1.265monce a day  . lisinopril (ZESTRIL) 20 MG tablet Take 1 tablet (20 mg total) by mouth daily.  . metFORMIN (GLUCOPHAGE) 1000 MG tablet Take 1 tablet (1,000 mg total) by mouth 2 (two) times daily with a meal.  . omeprazole (PRILOSEC) 20 MG capsule Take 1 capsule (20 mg total) by mouth daily.  . [DISCONTINUED] cyclobenzaprine (FLEXERIL) 10 MG tablet Take 1 tablet by mouth three times daily as needed for muscle spasm  . [DISCONTINUED] DULoxetine (CYMBALTA) 60 MG capsule Take 1 capsule (60 mg total) by mouth daily.  . [DISCONTINUED] liraglutide (VICTOZA) 18 MG/3ML SOPN Start 0.53m2mQ once a day for 7 days, then increase to 1.2mg29mce a day  . [DISCONTINUED] omeprazole (PRILOSEC) 20 MG capsule Take 1 capsule (20 mg total) by mouth daily.   No facility-administered encounter medications on file as  of 05/18/2020.    Review of Systems  Constitutional: Negative for chills and fever.  Eyes: Negative for redness and visual disturbance.  Respiratory: Negative for chest tightness and shortness of breath.   Cardiovascular: Negative for chest pain and leg swelling.  Musculoskeletal: Negative for back pain and gait problem.  Skin: Negative for rash.  Neurological: Negative for light-headedness and  headaches.  Psychiatric/Behavioral: Positive for decreased concentration, dysphoric mood and sleep disturbance. Negative for agitation, behavioral problems, self-injury and suicidal ideas. The patient is nervous/anxious.   All other systems reviewed and are negative.   Observations/Objective: Patient sounds comfortable and in no acute distress  Assessment and Plan: Problem List Items Addressed This Visit      Cardiovascular and Mediastinum   Hypertension associated with diabetes ([place])   Relevant Medications   liraglutide (VICTOZA) 18 MG/3ML SOPN   lisinopril (ZESTRIL) 20 MG tablet   Other Relevant Orders   CMP14+EGFR     Digestive   GERD (gastroesophageal reflux disease)   Relevant Medications   omeprazole (PRILOSEC) 20 MG capsule   Other Relevant Orders   CBC with Differential/Platelet     Endocrine   Type 2 diabetes mellitus (HCC) - Primary   Relevant Medications   liraglutide (VICTOZA) 18 MG/3ML SOPN   lisinopril (ZESTRIL) 20 MG tablet   Other Relevant Orders   Bayer DCA Hb A1c Waived   TSH     Other   Anxiety   Relevant Medications   DULoxetine (CYMBALTA) 60 MG capsule   Morbid obesity (HCC)   Relevant Medications   liraglutide (VICTOZA) 18 MG/3ML SOPN   Other Relevant Orders   Lipid panel   Severe episode of recurrent major depressive disorder, without psychotic features (HCC)   Relevant Medications   DULoxetine (CYMBALTA) 60 MG capsule   MDD (major depressive disorder), recurrent episode, moderate (HCC)   Relevant Medications   DULoxetine (CYMBALTA) 60 MG capsule    Other Visit Diagnoses    Panic attack       Relevant Medications   DULoxetine (CYMBALTA) 60 MG capsule      Continue current medications and will see psychiatry Follow up plan: Return in about 3 months (around 08/17/2020), or if symptoms worsen or fail to improve, for diabetes.     I discussed the assessment and treatment plan with the patient. The patient was provided an opportunity to  ask questions and all were answered. The patient agreed with the plan and demonstrated an understanding of the instructions.   The patient was advised to call back or seek an in-person evaluation if the symptoms worsen or if the condition fails to improve as anticipated.  The above assessment and management plan was discussed with the patient. The patient verbalized understanding of and has agreed to the management plan. Patient is aware to call the clinic if symptoms persist or worsen. Patient is aware when to return to the clinic for a follow-up visit. Patient educated on when it is appropriate to go to the emergency department.    I provided 12 minutes of non-face-to-face time during this encounter.    Worthy Rancher, MD

## 2020-06-08 IMAGING — DX DG CHEST 2V
2 series · 2 of 2 positions shown · non-contrast
Comparison: 07/04/2016

CLINICAL DATA: Chest pain

EXAM:
CHEST - 2 VIEW

[chest pa]
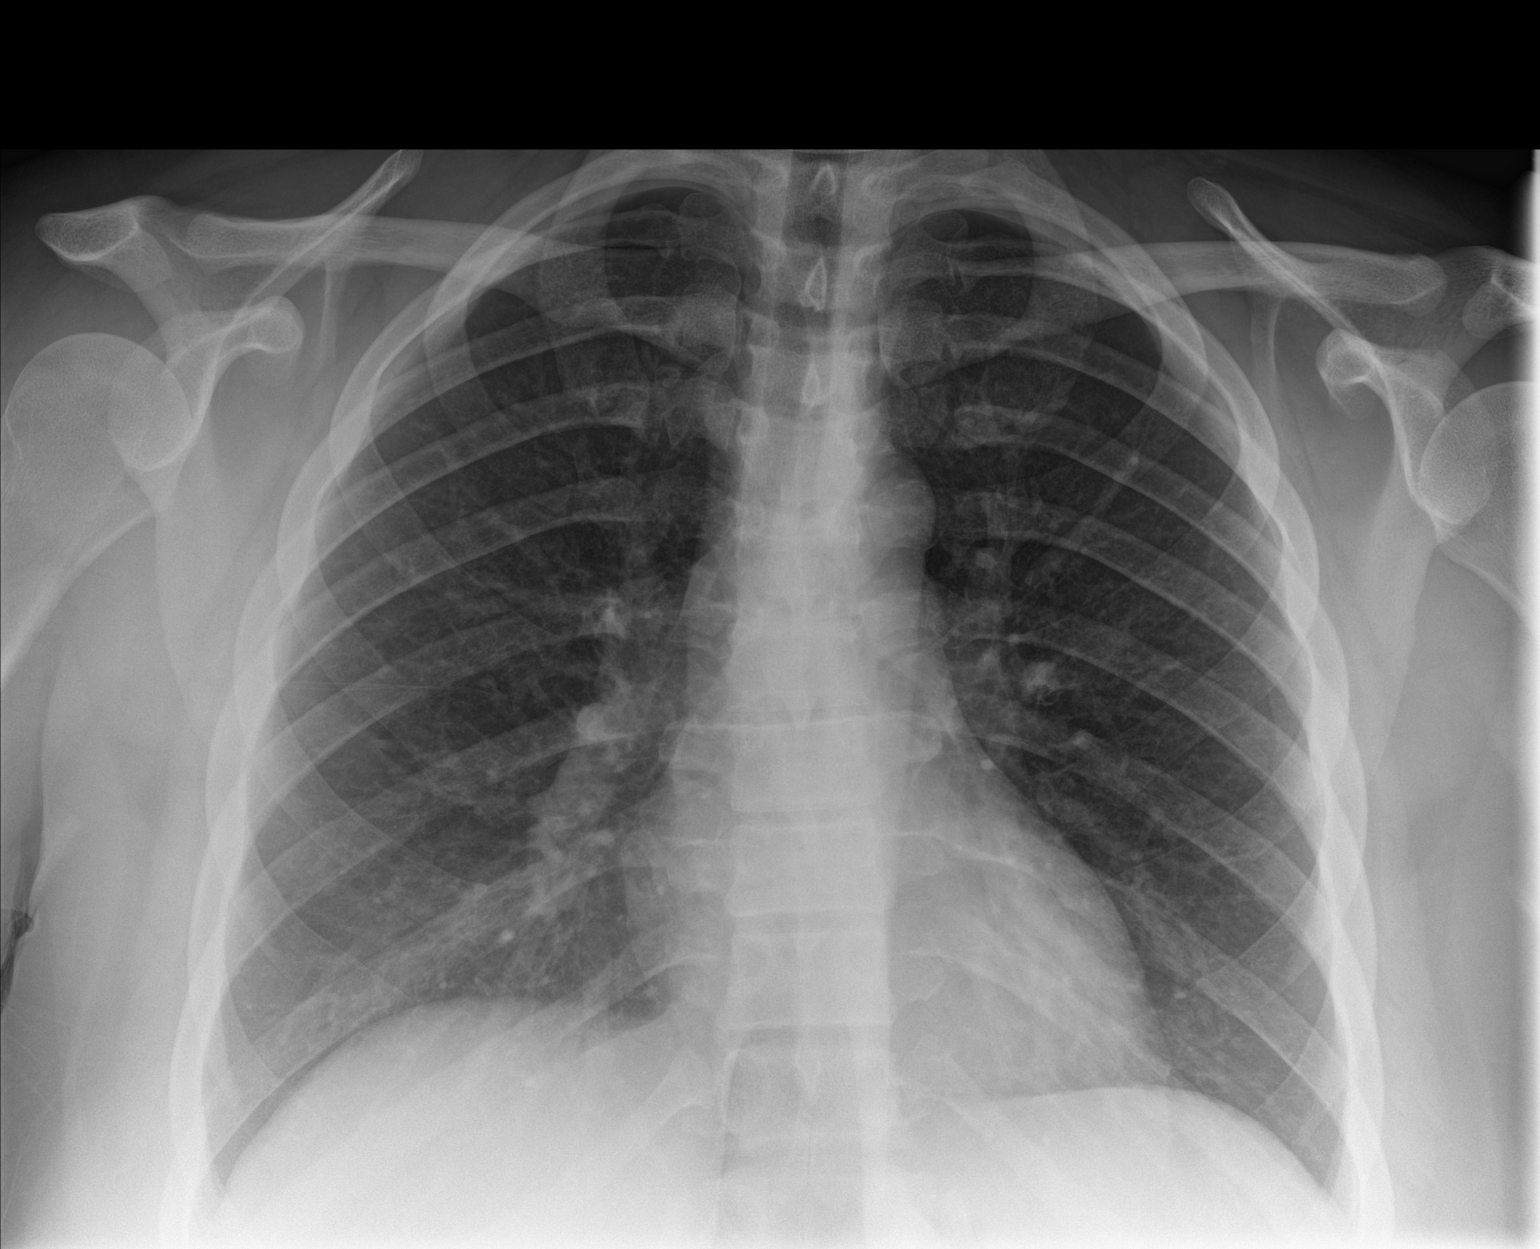

[chest lat]
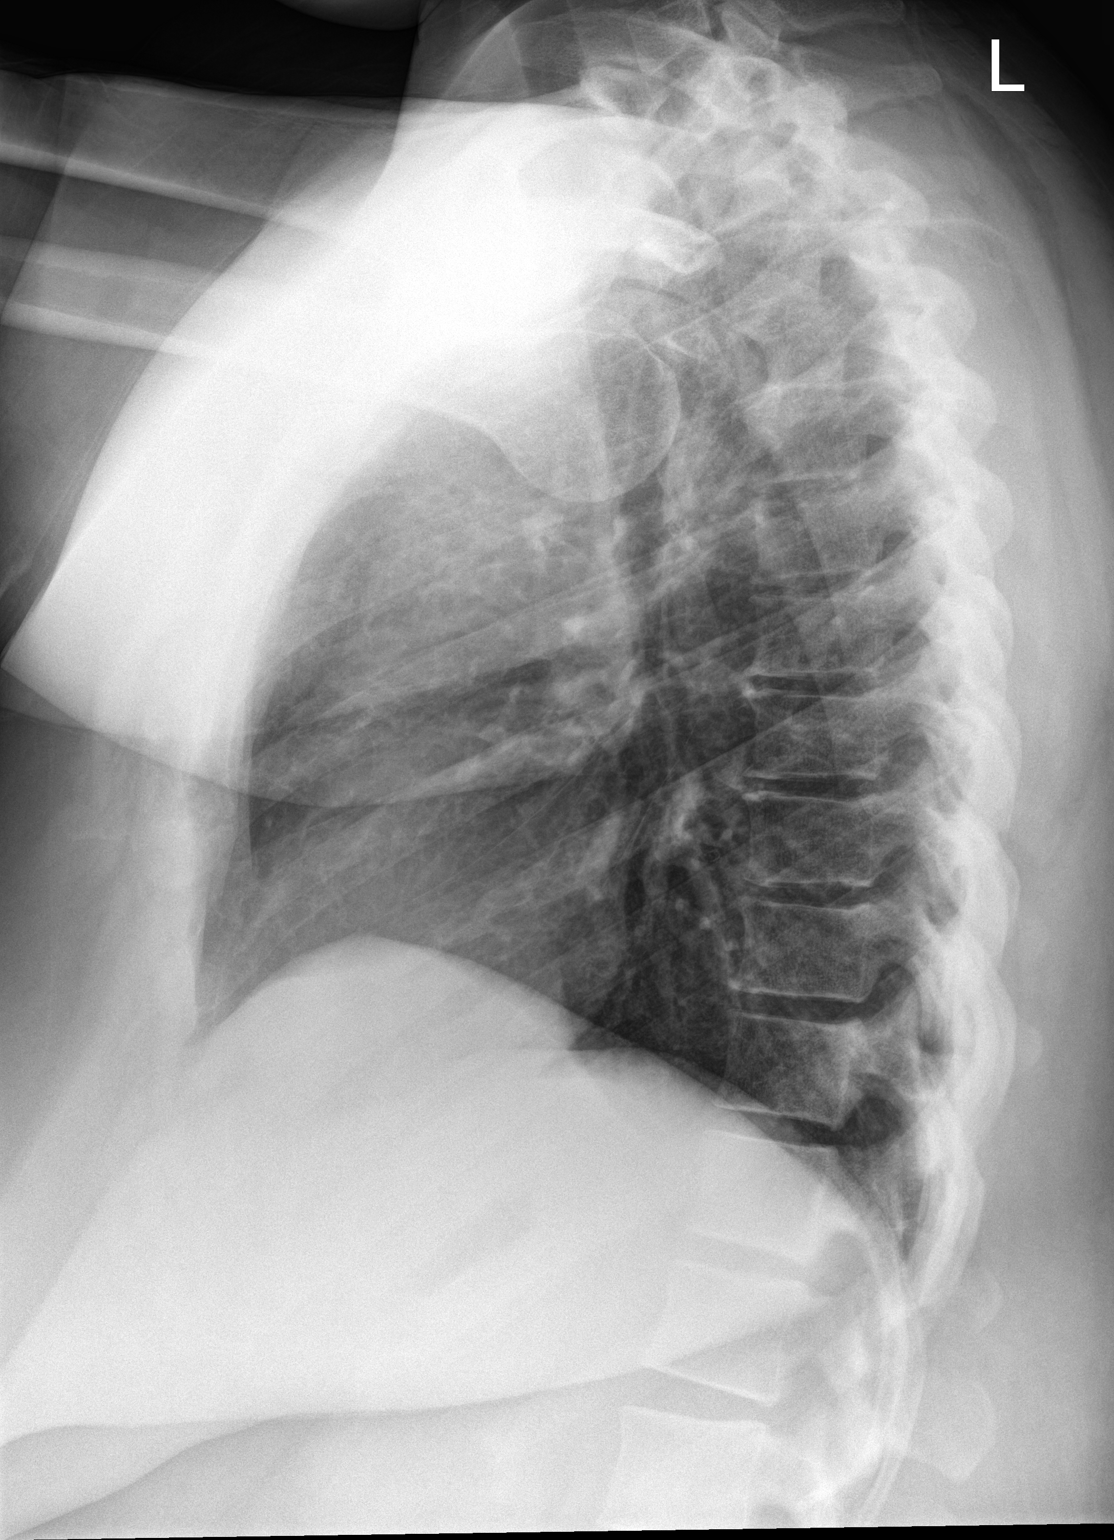

[2 of 2 positions shown; findings below may reference images not displayed]

FINDINGS: The heart size and mediastinal contours are within normal limits.
Both lungs are clear. The visualized skeletal structures are
unremarkable.
IMPRESSION: No active cardiopulmonary disease.

## 2020-06-20 ENCOUNTER — Other Ambulatory Visit: Payer: Self-pay | Admitting: Family Medicine

## 2020-06-20 DIAGNOSIS — E1159 Type 2 diabetes mellitus with other circulatory complications: Secondary | ICD-10-CM

## 2020-06-22 ENCOUNTER — Ambulatory Visit: Payer: Medicaid Other | Admitting: Family Medicine
